# Patient Record
Sex: Female | Born: 1945 | Race: White | Hispanic: No | Smoking: Never smoker
Health system: Southern US, Community
[De-identification: ages and names within clinical notes are randomized; demographics above are authoritative.]

## PROBLEM LIST (undated history)

## (undated) DIAGNOSIS — F419 Anxiety disorder, unspecified: Secondary | ICD-10-CM

## (undated) DIAGNOSIS — R0600 Dyspnea, unspecified: Secondary | ICD-10-CM

## (undated) DIAGNOSIS — R011 Cardiac murmur, unspecified: Secondary | ICD-10-CM

## (undated) DIAGNOSIS — K219 Gastro-esophageal reflux disease without esophagitis: Secondary | ICD-10-CM

## (undated) DIAGNOSIS — L659 Nonscarring hair loss, unspecified: Secondary | ICD-10-CM

## (undated) DIAGNOSIS — E785 Hyperlipidemia, unspecified: Secondary | ICD-10-CM

## (undated) DIAGNOSIS — R5383 Other fatigue: Secondary | ICD-10-CM

## (undated) DIAGNOSIS — R12 Heartburn: Secondary | ICD-10-CM

## (undated) DIAGNOSIS — E041 Nontoxic single thyroid nodule: Secondary | ICD-10-CM

## (undated) DIAGNOSIS — B019 Varicella without complication: Secondary | ICD-10-CM

## (undated) DIAGNOSIS — M1990 Unspecified osteoarthritis, unspecified site: Secondary | ICD-10-CM

## (undated) DIAGNOSIS — K279 Peptic ulcer, site unspecified, unspecified as acute or chronic, without hemorrhage or perforation: Secondary | ICD-10-CM

## (undated) DIAGNOSIS — I499 Cardiac arrhythmia, unspecified: Secondary | ICD-10-CM

## (undated) DIAGNOSIS — G43909 Migraine, unspecified, not intractable, without status migrainosus: Secondary | ICD-10-CM

## (undated) HISTORY — PX: APPENDECTOMY: SHX54

## (undated) HISTORY — PX: JOINT REPLACEMENT: SHX530

## (undated) HISTORY — DX: Nonscarring hair loss, unspecified: L65.9

## (undated) HISTORY — PX: TUBAL LIGATION: SHX77

## (undated) HISTORY — DX: Heartburn: R12

## (undated) HISTORY — DX: Peptic ulcer, site unspecified, unspecified as acute or chronic, without hemorrhage or perforation: K27.9

## (undated) HISTORY — DX: Hyperlipidemia, unspecified: E78.5

## (undated) HISTORY — PX: CHOLECYSTECTOMY: SHX55

## (undated) HISTORY — DX: Other fatigue: R53.83

## (undated) HISTORY — PX: DILATION AND CURETTAGE OF UTERUS: SHX78

## (undated) HISTORY — PX: EYE SURGERY: SHX253

## (undated) HISTORY — PX: BREAST SURGERY: SHX581

## (undated) HISTORY — DX: Varicella without complication: B01.9

## (undated) HISTORY — DX: Migraine, unspecified, not intractable, without status migrainosus: G43.909

## (undated) HISTORY — PX: BREAST EXCISIONAL BIOPSY: SUR124

## (undated) HISTORY — PX: TONSILLECTOMY AND ADENOIDECTOMY: SUR1326

---

## 2005-03-25 ENCOUNTER — Ambulatory Visit: Payer: Self-pay | Admitting: Unknown Physician Specialty

## 2005-03-28 ENCOUNTER — Ambulatory Visit: Payer: Self-pay | Admitting: Unknown Physician Specialty

## 2005-04-26 ENCOUNTER — Ambulatory Visit: Payer: Self-pay | Admitting: General Surgery

## 2008-11-22 ENCOUNTER — Ambulatory Visit: Payer: Self-pay | Admitting: Unknown Physician Specialty

## 2009-12-07 ENCOUNTER — Ambulatory Visit: Payer: Self-pay | Admitting: Unknown Physician Specialty

## 2010-12-17 ENCOUNTER — Ambulatory Visit: Payer: Self-pay | Admitting: Unknown Physician Specialty

## 2011-05-09 ENCOUNTER — Emergency Department: Payer: Self-pay | Admitting: Internal Medicine

## 2011-05-13 ENCOUNTER — Ambulatory Visit: Payer: Self-pay | Admitting: Gastroenterology

## 2011-05-14 LAB — PATHOLOGY REPORT

## 2012-10-29 ENCOUNTER — Ambulatory Visit: Payer: Self-pay | Admitting: Obstetrics and Gynecology

## 2012-10-29 DIAGNOSIS — I10 Essential (primary) hypertension: Secondary | ICD-10-CM

## 2012-10-29 LAB — BASIC METABOLIC PANEL
Anion Gap: 4 — ABNORMAL LOW (ref 7–16)
BUN: 17 mg/dL (ref 7–18)
Calcium, Total: 8.8 mg/dL (ref 8.5–10.1)
EGFR (Non-African Amer.): >60
Glucose: 100 mg/dL — ABNORMAL HIGH (ref 65–99)
Osmolality: 281 (ref 275–301)
Sodium: 140 mmol/L (ref 136–145)

## 2012-10-29 LAB — CBC
HCT: 40 % (ref 35.0–47.0)
HGB: 13.4 g/dL (ref 12.0–16.0)
MCH: 29.7 pg (ref 26.0–34.0)
MCV: 89 fL (ref 80–100)
Platelet: 228 10*3/uL (ref 150–440)
RBC: 4.5 10*6/uL (ref 3.80–5.20)
RDW: 13.7 % (ref 11.5–14.5)

## 2013-03-23 ENCOUNTER — Encounter (HOSPITAL_COMMUNITY): Payer: Self-pay | Admitting: Pharmacy Technician

## 2013-03-24 ENCOUNTER — Other Ambulatory Visit (HOSPITAL_COMMUNITY): Payer: Medicare Other

## 2013-03-25 NOTE — Patient Instructions (Signed)
Kimberly Rojas  03/25/2013   Your procedure is scheduled on:  03/31/13              Surgery 0730am-0900am  Report to [facility] at    0530  AM.  Call this number if you have problems the morning of surgery: [phone]   Remember:   Do not eat food or drink liquids after midnight.   Take these medicines the morning of surgery with A SIP OF WATER:    Do not wear jewelry, make-up or nail polish.  Do not wear lotions, powders, or perfumes.   Do not shave 48 hours prior to surgery.  Do not bring valuables to the hospital.  Contacts, dentures or bridgework may not be worn into surgery.  Leave suitcase in the car. After surgery it may be brought to your room.  For patients admitted to the hospital, checkout time is 11:00 AM the day of  discharge.      SEE CHG INSTRUCTION SHEET    Please read over the following fact sheets that you were given:  coughing and deep breathing exercises, leg exercises               Failure to comply with these instructions may result in cancellation of your surgery.                Patient Signature ____________________________              Nurse Signature _____________________________

## 2013-03-26 ENCOUNTER — Ambulatory Visit (HOSPITAL_COMMUNITY)
Admission: RE | Admit: 2013-03-26 | Discharge: 2013-03-26 | Disposition: A | Discharge status: Home / Self Care | Payer: Medicare Other | Source: Ambulatory Visit | Attending: Surgical | Admitting: Surgical

## 2013-03-26 ENCOUNTER — Encounter (HOSPITAL_COMMUNITY): Payer: Self-pay

## 2013-03-26 ENCOUNTER — Encounter (HOSPITAL_COMMUNITY)
Admission: RE | Admit: 2013-03-26 | Discharge: 2013-03-26 | Disposition: A | Discharge status: Home / Self Care | Payer: Medicare Other | Source: Ambulatory Visit | Attending: Orthopedic Surgery | Admitting: Orthopedic Surgery

## 2013-03-26 DIAGNOSIS — Z01818 Encounter for other preprocedural examination: Secondary | ICD-10-CM | POA: Insufficient documentation

## 2013-03-26 DIAGNOSIS — S43429A Sprain of unspecified rotator cuff capsule, initial encounter: Secondary | ICD-10-CM | POA: Insufficient documentation

## 2013-03-26 DIAGNOSIS — X58XXXA Exposure to other specified factors, initial encounter: Secondary | ICD-10-CM | POA: Insufficient documentation

## 2013-03-26 DIAGNOSIS — Z01812 Encounter for preprocedural laboratory examination: Secondary | ICD-10-CM | POA: Insufficient documentation

## 2013-03-26 DIAGNOSIS — R9431 Abnormal electrocardiogram [ECG] [EKG]: Secondary | ICD-10-CM | POA: Insufficient documentation

## 2013-03-26 DIAGNOSIS — Z01810 Encounter for preprocedural cardiovascular examination: Secondary | ICD-10-CM | POA: Insufficient documentation

## 2013-03-26 HISTORY — DX: Unspecified osteoarthritis, unspecified site: M19.90

## 2013-03-26 HISTORY — DX: Cardiac murmur, unspecified: R01.1

## 2013-03-26 LAB — PROTIME-INR
INR: 1.03 (ref 0.00–1.49)
Prothrombin Time: 13.3 seconds (ref 11.6–15.2)

## 2013-03-26 LAB — COMPREHENSIVE METABOLIC PANEL
ALT: 12 U/L (ref 0–35)
AST: 17 U/L (ref 0–37)
Albumin: 3.4 g/dL — ABNORMAL LOW (ref 3.5–5.2)
Alkaline Phosphatase: 91 U/L (ref 39–117)
BUN: 14 mg/dL (ref 6–23)
CO2: 29 mEq/L (ref 19–32)
Calcium: 8.9 mg/dL (ref 8.4–10.5)
Chloride: 102 mEq/L (ref 96–112)
Creatinine, Ser: 0.82 mg/dL (ref 0.50–1.10)
GFR calc Af Amer: 83 mL/min — ABNORMAL LOW (ref >90)
GFR calc non Af Amer: 71 mL/min — ABNORMAL LOW (ref >90)
Glucose, Bld: 123 mg/dL — ABNORMAL HIGH (ref 70–99)
Potassium: 3.4 mEq/L — ABNORMAL LOW (ref 3.5–5.1)
Sodium: 139 mEq/L (ref 135–145)
Total Bilirubin: 0.3 mg/dL (ref 0.3–1.2)
Total Protein: 6.9 g/dL (ref 6.0–8.3)

## 2013-03-26 LAB — APTT: aPTT: 32 seconds (ref 24–37)

## 2013-03-26 LAB — URINALYSIS, ROUTINE W REFLEX MICROSCOPIC
Bilirubin Urine: NEGATIVE
Glucose, UA: NEGATIVE mg/dL
Ketones, ur: NEGATIVE mg/dL
Leukocytes, UA: NEGATIVE
Nitrite: NEGATIVE
Protein, ur: NEGATIVE mg/dL
Specific Gravity, Urine: 1.016 (ref 1.005–1.030)
Urobilinogen, UA: 0.2 mg/dL (ref 0.0–1.0)
pH: 5 (ref 5.0–8.0)

## 2013-03-26 LAB — CBC
HCT: 37.9 % (ref 36.0–46.0)
Platelets: 257 10*3/uL (ref 150–400)
RBC: 4.27 MIL/uL (ref 3.87–5.11)
RDW: 13.2 % (ref 11.5–15.5)
WBC: 7.2 10*3/uL (ref 4.0–10.5)

## 2013-03-26 LAB — URINE MICROSCOPIC-ADD ON

## 2013-03-26 NOTE — Progress Notes (Signed)
Urinalysis with micro results faxed via EPIC to Dr Gioffre.  

## 2013-03-26 NOTE — Progress Notes (Signed)
Last office visit with PCP 10/2012 on chart

## 2013-03-26 NOTE — Progress Notes (Signed)
Patient states: "  it takes very little pain medication for her. "

## 2013-03-28 NOTE — H&P (Signed)
Kimberly Rojas is an 67 y.o. female.   Chief Complaint: right shoulder pain HPI: [name] presented with the chief complaint of right shoulder pain. She reports she has been followed for this for the past 6 months. She was seen in December, at which time she had a shoulder injection with cortisone. It gave her relief until March. At that time she had just the right shoulder injected with cortisone. She says that lasted through about April, but for the couple months she's had increased pain mainly in the right shoulder. No new injury; however, she does report that, after some recollection she did fall back in September/October onto the right arm. She didn't feel a pop or a snap at that time but has developed these symptoms thereafter. She once again denies numbness and tingling in the arms. She does have some decreased ROM due to pain and does feel that the right arm mainly isn't strong enough to do what she needs it to do. She has pain if she lies on that side. She has pain with any activity with that right shoulder primarily. She says that the pain does occasionally radiate down through the right upper arm. It does not radiate down the entire arm, and she denies neck pain.MRI revealed a rotator cuff tear in the right shoulder.  Past Medical History  Diagnosis Date  . Heart murmur     hx of   . Arthritis     Past Surgical History  Procedure Laterality Date  . Cholecystectomy    . Breast surgery      left breast biopsy     Social History:  reports that she has never smoked. She has never used smokeless tobacco. She reports that  drinks alcohol. She reports that she does not use illicit drugs.  Allergies: No Known Allergies   Current outpatient prescriptions: acetaminophen (TYLENOL) 500 MG tablet, Take 500 mg by mouth every 6 (six) hours as needed for pain., Disp: , Rfl: ;   sodium chloride (OCEAN) 0.65 % nasal spray, Place 1 spray into the nose daily as needed for congestion.,  Disp: , Rfl:   Review of Systems  Constitutional: Negative.   HENT: Positive for hearing loss and tinnitus. Negative for ear pain, nosebleeds, congestion, sore throat, neck pain and ear discharge.   Eyes: Negative.   Respiratory: Negative.  Negative for stridor.   Cardiovascular: Positive for palpitations. Negative for chest pain, orthopnea, claudication, leg swelling and PND.  Gastrointestinal: Positive for heartburn. Negative for nausea, vomiting, abdominal pain, diarrhea, constipation and blood in stool.  Genitourinary: Negative.   Musculoskeletal: Positive for joint pain. Negative for myalgias, back pain and falls.       Right shoulder pain  Skin: Negative.   Neurological: Negative.  Negative for headaches.  Endo/Heme/Allergies: Negative.   Psychiatric/Behavioral: Negative.    Physical Exam  Constitutional: She is oriented to person, place, and time. She appears well-developed and well-nourished. No distress.  HENT:  Head: Normocephalic and atraumatic.  Right Ear: External ear normal.  Left Ear: External ear normal.  Nose: Nose normal.  Mouth/Throat: Oropharynx is clear and moist.  Eyes: Conjunctivae and EOM are normal.  Neck: Normal range of motion. Neck supple. No tracheal deviation present.  Cardiovascular: Normal rate, regular rhythm, normal heart sounds and intact distal pulses.   No murmur heard. Respiratory: Effort normal and breath sounds normal. No respiratory distress. She has no wheezes. She exhibits no tenderness.  GI: Soft. Bowel sounds are normal. She exhibits no  distension and no mass. There is no tenderness.  Musculoskeletal:       Right shoulder: She exhibits decreased range of motion, tenderness, pain and decreased strength.       Left shoulder: Normal.       Right elbow: Normal.      Left elbow: Normal.       Right wrist: Normal.       Left wrist: Normal.  Lymphadenopathy:    She has no cervical adenopathy.  Neurological: She is alert and oriented to  person, place, and time. She has normal reflexes. No sensory deficit.  Skin: No rash noted. No erythema.  Psychiatric: She has a normal mood and affect. Her behavior is normal.     Assessment/Plan Right shoulder, rotator cuff tear She needs an open right shoulder rotator cuff repair with graft and anchors. We may or may not need to use a graft material that is made from calf skin. This is approved by the FDA and Dr. Darrelyn Hillock have not had a rejection of that graft yet. Also, we may need to use anchors. These are polyethylene anchors that stay in the bone and we use those anchors to suture the tendon down in certain cases where the tendon is completely pulled off the bone. There is always a chance of a secondary infection obviously with any surgery but we do use antibiotics preop.   [address] PA-C   [place], [place] Leotis Shames 03/28/2013, 2:16 PM

## 2013-03-31 ENCOUNTER — Ambulatory Visit (HOSPITAL_COMMUNITY): Payer: Medicare Other | Admitting: Registered Nurse

## 2013-03-31 ENCOUNTER — Encounter (HOSPITAL_COMMUNITY): Payer: Self-pay | Admitting: Registered Nurse

## 2013-03-31 ENCOUNTER — Observation Stay (HOSPITAL_COMMUNITY)
Admission: RE | Admit: 2013-03-31 | Discharge: 2013-04-01 | Disposition: A | Discharge status: Home / Self Care | Payer: Medicare Other | Source: Ambulatory Visit | Attending: Orthopedic Surgery | Admitting: Orthopedic Surgery

## 2013-03-31 ENCOUNTER — Encounter (HOSPITAL_COMMUNITY)
Admission: RE | Disposition: A | Discharge status: Home / Self Care | Payer: Self-pay | Source: Ambulatory Visit | Attending: Orthopedic Surgery

## 2013-03-31 ENCOUNTER — Encounter (HOSPITAL_COMMUNITY): Payer: Self-pay | Admitting: *Deleted

## 2013-03-31 DIAGNOSIS — M75120 Complete rotator cuff tear or rupture of unspecified shoulder, not specified as traumatic: Secondary | ICD-10-CM | POA: Diagnosis present

## 2013-03-31 DIAGNOSIS — S43429A Sprain of unspecified rotator cuff capsule, initial encounter: Principal | ICD-10-CM | POA: Insufficient documentation

## 2013-03-31 DIAGNOSIS — M75121 Complete rotator cuff tear or rupture of right shoulder, not specified as traumatic: Secondary | ICD-10-CM

## 2013-03-31 DIAGNOSIS — X58XXXA Exposure to other specified factors, initial encounter: Secondary | ICD-10-CM | POA: Insufficient documentation

## 2013-03-31 HISTORY — PX: SHOULDER OPEN ROTATOR CUFF REPAIR: SHX2407

## 2013-03-31 SURGERY — REPAIR, ROTATOR CUFF, OPEN
Anesthesia: General | Site: Shoulder | Laterality: Right | Wound class: Clean

## 2013-03-31 MED ORDER — HYDROMORPHONE HCL PF 1 MG/ML IJ SOLN
INTRAMUSCULAR | Status: AC
  Filled 2013-03-31: qty 1

## 2013-03-31 MED ORDER — BUPIVACAINE LIPOSOME 1.3 % IJ SUSP
20.0000 mL | Freq: Once | INTRAMUSCULAR | Status: DC
  Filled 2013-03-31: qty 20

## 2013-03-31 MED ORDER — FENTANYL CITRATE 0.05 MG/ML IJ SOLN
INTRAMUSCULAR | Status: DC | PRN
  Administered 2013-03-31 (×3): 50 ug via INTRAVENOUS

## 2013-03-31 MED ORDER — HYDROMORPHONE HCL PF 1 MG/ML IJ SOLN
0.5000 mg | INTRAMUSCULAR | Status: DC | PRN
  Administered 2013-03-31: 1 mg via INTRAVENOUS
  Filled 2013-03-31: qty 1

## 2013-03-31 MED ORDER — HYDROMORPHONE HCL PF 1 MG/ML IJ SOLN
0.2500 mg | INTRAMUSCULAR | Status: DC | PRN
  Administered 2013-03-31 (×4): 0.5 mg via INTRAVENOUS

## 2013-03-31 MED ORDER — METHOCARBAMOL 500 MG PO TABS
500.0000 mg | ORAL_TABLET | Freq: Four times a day (QID) | ORAL | Status: DC | PRN
  Administered 2013-03-31 – 2013-04-01 (×2): 500 mg via ORAL
  Filled 2013-03-31 (×2): qty 1

## 2013-03-31 MED ORDER — SODIUM CHLORIDE 0.9 % IJ SOLN
INTRAMUSCULAR | Status: DC | PRN
  Administered 2013-03-31: 09:00:00

## 2013-03-31 MED ORDER — ACETAMINOPHEN 650 MG RE SUPP: 650.0000 mg | Freq: Four times a day (QID) | RECTAL | Status: DC | PRN

## 2013-03-31 MED ORDER — CEFAZOLIN SODIUM-DEXTROSE 2-3 GM-% IV SOLR
2.0000 g | INTRAVENOUS | Status: AC
  Administered 2013-03-31: 2 g via INTRAVENOUS

## 2013-03-31 MED ORDER — SODIUM CHLORIDE 0.9 % IR SOLN
Status: DC | PRN
  Administered 2013-03-31: 08:00:00

## 2013-03-31 MED ORDER — BISACODYL 10 MG RE SUPP: 10.0000 mg | Freq: Every day | RECTAL | Status: DC | PRN

## 2013-03-31 MED ORDER — HYDROCODONE-ACETAMINOPHEN 5-325 MG PO TABS
1.0000 | ORAL_TABLET | ORAL | Status: DC | PRN
  Administered 2013-03-31 (×2): 1 via ORAL
  Administered 2013-04-01: 2 via ORAL
  Administered 2013-04-01 (×2): 1 via ORAL
  Filled 2013-03-31: qty 2
  Filled 2013-03-31 (×4): qty 1

## 2013-03-31 MED ORDER — ROCURONIUM BROMIDE 100 MG/10ML IV SOLN
INTRAVENOUS | Status: DC | PRN
  Administered 2013-03-31: 35 mg via INTRAVENOUS

## 2013-03-31 MED ORDER — ONDANSETRON HCL 4 MG PO TABS: 4.0000 mg | ORAL_TABLET | Freq: Four times a day (QID) | ORAL | Status: DC | PRN

## 2013-03-31 MED ORDER — EPHEDRINE SULFATE 50 MG/ML IJ SOLN
INTRAMUSCULAR | Status: DC | PRN
  Administered 2013-03-31: 5 mg via INTRAVENOUS

## 2013-03-31 MED ORDER — MIDAZOLAM HCL 5 MG/5ML IJ SOLN
INTRAMUSCULAR | Status: DC | PRN
  Administered 2013-03-31 (×2): 1 mg via INTRAVENOUS

## 2013-03-31 MED ORDER — LIDOCAINE HCL (CARDIAC) 20 MG/ML IV SOLN
INTRAVENOUS | Status: DC | PRN
  Administered 2013-03-31: 70 mg via INTRAVENOUS

## 2013-03-31 MED ORDER — MENTHOL 3 MG MT LOZG: 1.0000 | LOZENGE | OROMUCOSAL | Status: DC | PRN

## 2013-03-31 MED ORDER — LACTATED RINGERS IV SOLN
INTRAVENOUS | Status: DC
  Administered 2013-03-31 – 2013-04-01 (×2): via INTRAVENOUS

## 2013-03-31 MED ORDER — PROPOFOL 10 MG/ML IV BOLUS
INTRAVENOUS | Status: DC | PRN
  Administered 2013-03-31: 150 mg via INTRAVENOUS

## 2013-03-31 MED ORDER — PROMETHAZINE HCL 25 MG/ML IJ SOLN: 6.2500 mg | INTRAMUSCULAR | Status: DC | PRN

## 2013-03-31 MED ORDER — POLYETHYLENE GLYCOL 3350 17 G PO PACK: 17.0000 g | PACK | Freq: Every day | ORAL | Status: DC | PRN

## 2013-03-31 MED ORDER — GLYCOPYRROLATE 0.2 MG/ML IJ SOLN
INTRAMUSCULAR | Status: DC | PRN
  Administered 2013-03-31: .6 mg via INTRAVENOUS

## 2013-03-31 MED ORDER — PHENOL 1.4 % MT LIQD: 1.0000 | OROMUCOSAL | Status: DC | PRN

## 2013-03-31 MED ORDER — PHENYLEPHRINE HCL 10 MG/ML IJ SOLN
INTRAMUSCULAR | Status: DC | PRN
  Administered 2013-03-31 (×3): 40 ug via INTRAVENOUS

## 2013-03-31 MED ORDER — METHOCARBAMOL 100 MG/ML IJ SOLN
500.0000 mg | Freq: Four times a day (QID) | INTRAVENOUS | Status: DC | PRN
  Administered 2013-03-31: 500 mg via INTRAVENOUS
  Filled 2013-03-31: qty 5

## 2013-03-31 MED ORDER — ACETAMINOPHEN 325 MG PO TABS: 650.0000 mg | ORAL_TABLET | Freq: Four times a day (QID) | ORAL | Status: DC | PRN

## 2013-03-31 MED ORDER — THROMBIN 5000 UNITS EX SOLR
CUTANEOUS | Status: AC
  Filled 2013-03-31: qty 5000

## 2013-03-31 MED ORDER — ONDANSETRON HCL 4 MG/2ML IJ SOLN
4.0000 mg | Freq: Four times a day (QID) | INTRAMUSCULAR | Status: DC | PRN
  Administered 2013-03-31: 4 mg via INTRAVENOUS
  Filled 2013-03-31: qty 2

## 2013-03-31 MED ORDER — CEFAZOLIN SODIUM 1-5 GM-% IV SOLN
1.0000 g | Freq: Four times a day (QID) | INTRAVENOUS | Status: AC
  Administered 2013-03-31 – 2013-04-01 (×3): 1 g via INTRAVENOUS
  Filled 2013-03-31 (×3): qty 50

## 2013-03-31 MED ORDER — OXYCODONE-ACETAMINOPHEN 5-325 MG PO TABS
1.0000 | ORAL_TABLET | ORAL | Status: DC | PRN
  Administered 2013-03-31: 1 via ORAL
  Filled 2013-03-31: qty 1

## 2013-03-31 MED ORDER — THROMBIN 5000 UNITS EX SOLR
OROMUCOSAL | Status: DC | PRN
  Administered 2013-03-31: 08:00:00 via TOPICAL

## 2013-03-31 MED ORDER — ONDANSETRON HCL 4 MG/2ML IJ SOLN
INTRAMUSCULAR | Status: DC | PRN
  Administered 2013-03-31: 4 mg via INTRAVENOUS

## 2013-03-31 MED ORDER — LACTATED RINGERS IV SOLN
INTRAVENOUS | Status: DC
  Administered 2013-03-31 (×2): via INTRAVENOUS

## 2013-03-31 MED ORDER — CEFAZOLIN SODIUM-DEXTROSE 2-3 GM-% IV SOLR
INTRAVENOUS | Status: AC
  Filled 2013-03-31: qty 50

## 2013-03-31 MED ORDER — NEOSTIGMINE METHYLSULFATE 1 MG/ML IJ SOLN
INTRAMUSCULAR | Status: DC | PRN
  Administered 2013-03-31: 4 mg via INTRAVENOUS

## 2013-03-31 SURGICAL SUPPLY — 42 items
ANCHOR PEEK ZIP 5.5 NDL NO2 (Orthopedic Implant) ×6 IMPLANT
BAG ZIPLOCK 12X15 (MISCELLANEOUS) ×2 IMPLANT
BLADE OSCILLATING/SAGITTAL (BLADE) ×1
BLADE SW THK.38XMED LNG THN (BLADE) ×1 IMPLANT
BNDG COHESIVE 6X5 TAN NS LF (GAUZE/BANDAGES/DRESSINGS) ×2 IMPLANT
BUR OVAL CARBIDE 4.0 (BURR) ×2 IMPLANT
CLEANER TIP ELECTROSURG 2X2 (MISCELLANEOUS) ×2 IMPLANT
CLOTH BEACON ORANGE TIMEOUT ST (SAFETY) ×2 IMPLANT
DERMABOND ADVANCED (GAUZE/BANDAGES/DRESSINGS) ×1
DERMABOND ADVANCED .7 DNX12 (GAUZE/BANDAGES/DRESSINGS) ×1 IMPLANT
DRAPE POUCH INSTRU U-SHP 10X18 (DRAPES) ×2 IMPLANT
DRSG AQUACEL AG ADV 3.5X 6 (GAUZE/BANDAGES/DRESSINGS) ×2 IMPLANT
DURAPREP 26ML APPLICATOR (WOUND CARE) ×2 IMPLANT
ELECT REM PT RETURN 9FT ADLT (ELECTROSURGICAL) ×2
ELECTRODE REM PT RTRN 9FT ADLT (ELECTROSURGICAL) ×1 IMPLANT
GLOVE BIOGEL PI IND STRL 8 (GLOVE) ×1 IMPLANT
GLOVE BIOGEL PI INDICATOR 8 (GLOVE) ×1
GLOVE ECLIPSE 8.0 STRL XLNG CF (GLOVE) ×4 IMPLANT
GLOVE SURG SS PI 6.5 STRL IVOR (GLOVE) ×4 IMPLANT
GOWN PREVENTION PLUS LG XLONG (DISPOSABLE) ×2 IMPLANT
GOWN PREVENTION PLUS XXLARGE (GOWN DISPOSABLE) ×4 IMPLANT
KIT BASIN OR (CUSTOM PROCEDURE TRAY) ×2 IMPLANT
MANIFOLD NEPTUNE II (INSTRUMENTS) ×2 IMPLANT
NS IRRIG 1000ML POUR BTL (IV SOLUTION) ×2 IMPLANT
PACK SHOULDER CUSTOM OPM052 (CUSTOM PROCEDURE TRAY) ×2 IMPLANT
PASSER SUT SWANSON 36MM LOOP (INSTRUMENTS) IMPLANT
PATCH TISSUE MEND 3X3CM (Orthopedic Implant) ×2 IMPLANT
POSITIONER SURGICAL ARM (MISCELLANEOUS) ×2 IMPLANT
SLING ARM IMMOBILIZER MED (SOFTGOODS) ×2 IMPLANT
SPONGE SURGIFOAM ABS GEL 100 (HEMOSTASIS) ×2 IMPLANT
STAPLER VISISTAT 35W (STAPLE) IMPLANT
SUCTION FRAZIER 12FR DISP (SUCTIONS) ×2 IMPLANT
SUT BONE WAX W31G (SUTURE) ×2 IMPLANT
SUT ETHIBOND NAB CT1 #1 30IN (SUTURE) ×4 IMPLANT
SUT MNCRL AB 4-0 PS2 18 (SUTURE) ×2 IMPLANT
SUT VIC AB 0 CT1 27 (SUTURE) ×1
SUT VIC AB 0 CT1 27XBRD ANTBC (SUTURE) ×1 IMPLANT
SUT VIC AB 1 CT1 27 (SUTURE) ×2
SUT VIC AB 1 CT1 27XBRD ANTBC (SUTURE) ×2 IMPLANT
SUT VIC AB 2-0 CT1 27 (SUTURE) ×1
SUT VIC AB 2-0 CT1 27XBRD (SUTURE) ×1 IMPLANT
TOWEL OR 17X26 10 PK STRL BLUE (TOWEL DISPOSABLE) ×2 IMPLANT

## 2013-03-31 NOTE — Preoperative (Signed)
Beta Blockers   Reason not to administer Beta Blockers:Not Applicable 

## 2013-03-31 NOTE — Plan of Care (Signed)
Problem: Diagnosis - Type of Surgery Goal: General Surgical Patient Education (See Patient Education module for education specifics) Right rotator cuff     

## 2013-03-31 NOTE — Interval H&P Note (Signed)
History and Physical Interval Note:  03/31/2013 7:15 AM  Kimberly Rojas  has presented today for surgery, with the diagnosis of right shoulder rotator cuff tear  The various methods of treatment have been discussed with the patient and family. After consideration of risks, benefits and other options for treatment, the patient has consented to  Procedure(s): RIGHT ROTATOR CUFF REPAIR SHOULDER OPEN (Right) as a surgical intervention .  The patient's history has been reviewed, patient examined, no change in status, stable for surgery.  I have reviewed the patient's chart and labs.  Questions were answered to the patient's satisfaction.     Lyberti Thrush A

## 2013-03-31 NOTE — Transfer of Care (Signed)
Immediate Anesthesia Transfer of Care Note  Patient: Kimberly Rojas  Procedure(s) Performed: Procedure(s): RIGHT ROTATOR CUFF REPAIR SHOULDER OPEN (Right)  Patient Location: PACU  Anesthesia Type:General  Level of Consciousness: awake, alert , oriented and patient cooperative  Airway & Oxygen Therapy: Patient Spontanous Breathing and Patient connected to face mask oxygen  Post-op Assessment: Report given to PACU RN, Post -op Vital signs reviewed and stable and Patient moving all extremities  Post vital signs: Reviewed and stable  Complications: No apparent anesthesia complications

## 2013-03-31 NOTE — H&P (View-Only) (Signed)
Urinalysis with micro results faxed via EPIC to Dr Gioffre.  

## 2013-03-31 NOTE — Brief Op Note (Signed)
03/31/2013  8:36 AM  PATIENT:  Kimberly Rojas  67 y.o. female  PRE-OPERATIVE DIAGNOSIS:  right shoulder rotator cuff tear  POST-OPERATIVE DIAGNOSIS:  right shoulder rotator cuff tear,Complex,Retracted Tear  PROCEDURE:  Procedure(s): RIGHT ROTATOR CUFF REPAIR SHOULDER OPEN (Right),Complex Repair withTissue Mend Graft and 3-anchors.  SURGEON:  Surgeon(s) and Role:    * Jacki Cones, MD - Primary  PHYSICIAN ASSISTANT:Amber Shorter PA   ASSISTANTS: Dimitri Ped PA  ANESTHESIA:   general  EBL:  Total I/O In: 1000 [I.V.:1000] Out: -   BLOOD ADMINISTERED:none  DRAINS: none   LOCAL MEDICATIONS USED:  BUPIVICAINE 20cc   SPECIMEN:  No Specimen  DISPOSITION OF SPECIMEN:  N/A  COUNTS:  YES  TOURNIQUET:  * No tourniquets in log *  DICTATION: .Other Dictation: Dictation Number [id number]  PLAN OF CARE: Admit for overnight observation  PATIENT DISPOSITION:  Stable in OR   Delay start of Pharmacological VTE agent (>24hrs) due to surgical blood loss or risk of bleeding: yes

## 2013-03-31 NOTE — Anesthesia Postprocedure Evaluation (Signed)
  Anesthesia Post-op Note  Patient: Kimberly Rojas  Procedure(s) Performed: Procedure(s) (LRB): RIGHT ROTATOR CUFF REPAIR SHOULDER OPEN (Right)  Patient Location: PACU  Anesthesia Type: General  Level of Consciousness: awake and alert   Airway and Oxygen Therapy: Patient Spontanous Breathing  Post-op Pain: mild  Post-op Assessment: Post-op Vital signs reviewed, Patient's Cardiovascular Status Stable, Respiratory Function Stable, Patent Airway and No signs of Nausea or vomiting  Last Vitals:  Filed Vitals:   03/31/13 1126  BP: 146/76  Pulse: 75  Temp: 36.4 C  Resp: 16    Post-op Vital Signs: stable   Complications: No apparent anesthesia complications

## 2013-03-31 NOTE — Anesthesia Preprocedure Evaluation (Addendum)
Anesthesia Evaluation  Patient identified by MRN, date of birth, ID band Patient awake    Reviewed: Allergy & Precautions, H&P , NPO status , Patient's Chart, lab work & pertinent test results  Airway Mallampati: II TM Distance: >3 FB Neck ROM: Full    Dental no notable dental hx.    Pulmonary neg pulmonary ROS,  breath sounds clear to auscultation  Pulmonary exam normal       Cardiovascular Exercise Tolerance: Good negative cardio ROS  Rhythm:Regular Rate:Normal     Neuro/Psych negative neurological ROS  negative psych ROS   GI/Hepatic negative GI ROS, Neg liver ROS,   Endo/Other  negative endocrine ROS  Renal/GU negative Renal ROS  negative genitourinary   Musculoskeletal negative musculoskeletal ROS (+)   Abdominal   Peds negative pediatric ROS (+)  Hematology negative hematology ROS (+)   Anesthesia Other Findings   Reproductive/Obstetrics negative OB ROS                           Anesthesia Physical Anesthesia Plan  ASA: II  Anesthesia Plan: General   Post-op Pain Management:    Induction: Intravenous  Airway Management Planned: Oral ETT  Additional Equipment:   Intra-op Plan:   Post-operative Plan: Extubation in OR  Informed Consent: I have reviewed the patients History and Physical, chart, labs and discussed the procedure including the risks, benefits and alternatives for the proposed anesthesia with the patient or authorized representative who has indicated his/her understanding and acceptance.   Dental advisory given  Plan Discussed with: CRNA  Anesthesia Plan Comments:         Anesthesia Quick Evaluation  

## 2013-04-01 ENCOUNTER — Encounter (HOSPITAL_COMMUNITY): Payer: Self-pay | Admitting: Orthopedic Surgery

## 2013-04-01 MED ORDER — ONDANSETRON HCL 4 MG PO TABS: 4.0000 mg | ORAL_TABLET | Freq: Three times a day (TID) | ORAL | Status: DC | PRN

## 2013-04-01 MED ORDER — OXYCODONE-ACETAMINOPHEN 10-325 MG PO TABS: 1.0000 | ORAL_TABLET | ORAL | Status: DC | PRN

## 2013-04-01 MED ORDER — METHOCARBAMOL 500 MG PO TABS: 500.0000 mg | ORAL_TABLET | Freq: Four times a day (QID) | ORAL | Status: DC

## 2013-04-01 NOTE — Care Management Note (Signed)
    Page 1 of 1   04/01/2013     12:14:38 PM   CARE MANAGEMENT NOTE 04/01/2013  Patient:  Kimberly Rojas, Kimberly Rojas   Account Number:  [id number]  Date Initiated:  04/01/2013  Documentation initiated by:  Colleen Can  Subjective/Objective Assessment:   dx rorator cuff tera repair-rt     Action/Plan:   Home upon discharge. No HH or DME needs   Anticipated DC Date:  04/01/2013   Anticipated DC Plan:  HOME/SELF CARE      DC Planning Services  CM consult      Choice offered to / List presented to:             Status of service:  Completed, signed off Medicare Important Message given?   (If response is "NO", the following Medicare IM given date fields will be blank) Date Medicare IM given:   Date Additional Medicare IM given:    Discharge Disposition:  HOME/SELF CARE  Per UR Regulation:  Reviewed for med. necessity/level of care/duration of stay  If discussed at Long Length of Stay Meetings, dates discussed:    Comments:

## 2013-04-01 NOTE — Progress Notes (Signed)
Pt stable, scripts, d/c instructions given with no questions/concerns voiced by pt or husband.  Pt transported via wheelchair to private vehicle by NT and husband. 

## 2013-04-01 NOTE — Evaluation (Signed)
Occupational Therapy Evaluation Patient Details Name: Kimberly Rojas MRN: [id number] DOB: 02/28/1946 Today's Date: 04/01/2013 Time: 5621-3086 and 8:20 - 8:25 OT Time Calculation (min): 31 min  OT Assessment / Plan / Recommendation History of present illness     Clinical Impression   Pt was admitted for R RCR of complete tear.  All education was completed.  Pt will follow up with Dr Darrelyn Hillock for further rehab.      OT Assessment  Progress rehab of shoulder as ordered by MD at follow-up appointment    Follow Up Recommendations   (follow up with dr Darrelyn Hillock)    Barriers to Discharge      Equipment Recommendations  None recommended by OT    Recommendations for Other Services    Frequency       Precautions / Restrictions Precautions Precautions: Shoulder Type of Shoulder Precautions: sling at all times x bathing/dressing Precaution Booklet Issued: Yes (comment) Restrictions Weight Bearing Restrictions: No   Pertinent Vitals/Pain Pain 5/10 R shoulder.      ADL  Upper Body Bathing: Moderate assistance Where Assessed - Upper Body Bathing: Unsupported sitting Upper Body Dressing: Maximal assistance; pt wore zip up sports bra as well as zip up shirt Where Assessed - Upper Body Dressing: Unsupported sitting Lower Body Dressing: Moderate assistance Where Assessed - Lower Body Dressing: Supported sit to stand Toilet Transfer: Hydrographic surveyor Method: Sit to Barista: Comfort height toilet Toileting - Clothing Manipulation and Hygiene: Minimal assistance Where Assessed - Toileting Clothing Manipulation and Hygiene: Sit to stand from 3-in-1 or toilet Transfers/Ambulation Related to ADLs: pt moves quickly; cued to be careful as balance may be affected by slinged arm.  Husband will be with her all the time ADL Comments: Husband observed adl and donned sling with cues.  Verbalizes all education.  Please see education section of chart for topics covered.       OT Diagnosis: Generalized weakness  OT Problem List:   OT Treatment Interventions:     OT Goals(Current goals can be found in the care plan section)    Visit Information  Last OT Received On: 04/01/13 Assistance Needed: +1       Prior Functioning     Home Living Family/patient expects to be discharged to:: Private residence Living Arrangements: Spouse/significant other Prior Function Level of Independence: Independent Communication Communication: No difficulties Dominant Hand: Right         Vision/Perception     Cognition  Cognition Arousal/Alertness: Awake/alert Behavior During Therapy: WFL for tasks assessed/performed Overall Cognitive Status: Within Functional Limits for tasks assessed    Extremity/Trunk Assessment Upper Extremity Assessment Upper Extremity Assessment: RUE deficits/detail RUE Deficits / Details: RUE immoblized; LUE wfls     Mobility Transfers Transfers: Sit to Stand Sit to Stand: 5: Supervision     Exercise     Balance     End of Session OT - End of Session Activity Tolerance: Patient tolerated treatment well Patient left: in chair;with family/visitor present Nurse Communication:  (ready for d/c)  GO Functional Assessment Tool Used: clinical observation Functional Limitation: Self care Self Care Current Status (V7846): At least 40 percent but less than 60 percent impaired, limited or restricted Self Care Goal Status (N6295): At least 40 percent but less than 60 percent impaired, limited or restricted Self Care Discharge Status [phone]): At least 40 percent but less than 60 percent impaired, limited or restricted   [facility] 04/01/2013, 11:39 AM Marica Otter, OTR/L [phone] 04/01/2013

## 2013-04-01 NOTE — Progress Notes (Signed)
Subjective: 1 Day Post-Op Procedure(s) (LRB): RIGHT ROTATOR CUFF REPAIR SHOULDER OPEN (Right) Patient reports pain as 7 on 0-10 scale. Doing much better this morning. Will DC  Objective: Vital signs in last 24 hours: Temp:  [97.3 F (36.3 C)-98.5 F (36.9 C)] 98.4 F (36.9 C) (07/31 0544) Pulse Rate:  [64-85] 64 (07/31 0544) Resp:  [11-17] 14 (07/31 0544) BP: (113-151)/(61-81) 138/81 mmHg (07/31 0544) SpO2:  [96 %-100 %] 97 % (07/31 0544) Weight:  [79.833 kg (176 lb)] 79.833 kg (176 lb) (07/30 1015)  Intake/Output from previous day: 07/30 0701 - 07/31 0700 In: 3623.3 [P.O.:240; I.V.:3333.3; IV Piggyback:50] Out: 1020 [Urine:1000; Blood:20] Intake/Output this shift:    No results found for this basename: HGB,  in the last 72 hours No results found for this basename: WBC, RBC, HCT, PLT,  in the last 72 hours No results found for this basename: NA, K, CL, CO2, BUN, CREATININE, GLUCOSE, CALCIUM,  in the last 72 hours No results found for this basename: LABPT, INR,  in the last 72 hours  Neurovascular intact  Assessment/Plan: 1 Day Post-Op Procedure(s) (LRB): RIGHT ROTATOR CUFF REPAIR SHOULDER OPEN (Right) Discharge home with home health  Whitt Auletta A 04/01/2013, 7:14 AM

## 2013-04-01 NOTE — Discharge Summary (Signed)
Physician Discharge Summary  Patient ID: SHILEE BIGGS MRN: [id number] DOB/AGE: 69-26-1947 69 y.o.  Admit date: 03/31/2013 Discharge date: 04/01/2013  Admission Diagnoses:Complete Right Rotator Cuff Tear.  Discharge Diagnoses: Complete,Retracted ,Tear of Right Rotator cuff. Active Problems:   Complete tear of rotator cuff   Discharged Condition: Improved.  Hospital Course: No post-op Problems  Consults: Occupational Therapy  Significant Diagnostic Studies: None  Treatments:OT  Discharge Exam: Blood pressure 138/81, pulse 64, temperature 98.4 F (36.9 C), temperature source Oral, resp. rate 14, height 5\' 1"  (1.549 m), weight 79.833 kg (176 lb), SpO2 97.00%. Extremities: extremities normal, atraumatic, no cyanosis or edema  Disposition: Final discharge disposition not confirmed  Discharge Orders   Future Orders Complete By Expires     Call MD / Call 911  As directed     Comments:      If you experience chest pain or shortness of breath, CALL 911 and be transported to the hospital emergency room.  If you develope a fever above 101 F, pus (white drainage) or increased drainage or redness at the wound, or calf pain, call your surgeon's office.    Discharge instructions  As directed     Comments:      Keep your sling on at all times, including sleeping in your sling.  The only time you should remove your sling is to shower only but you need to keep your hand against your chest while you shower.  Call office to be seen in one and a haff weeks:[phone] For the first few days, remove your dressing, tape a piece of saran wrap over your incision, take your shower, then remove the saran wrap and put a clean dressing on, then reapply your sling.  After two days you can shower without the saran wrap.   Call Dr. Darrelyn Hillock if any wound complications or temperature of 101 degrees F or over.   Call the office for an appointment to see Dr. Darrelyn Hillock in two weeks: [phone] and ask for  Dr. Jeannetta Ellis nurse, Mackey [place].    Driving restrictions  As directed     Comments:      No driving for four weeks    Increase activity slowly as tolerated  As directed         Medication List    STOP taking these medications       acetaminophen 500 MG tablet  Commonly known as:  TYLENOL     sodium chloride 0.65 % nasal spray  Commonly known as:  OCEAN      TAKE these medications       methocarbamol 500 MG tablet  Commonly known as:  ROBAXIN  Take 1 tablet (500 mg total) by mouth 4 (four) times daily.     oxyCODONE-acetaminophen 10-325 MG per tablet  Commonly known as:  PERCOCET  Take 1 tablet by mouth every 4 (four) hours as needed for pain.         Signed: Brennen Camper A 04/01/2013, 7:20 AM

## 2013-04-01 NOTE — Op Note (Signed)
NAMEMAZELLA, DEEN              ACCOUNT NO.:  [id number]  MEDICAL RECORD NO.:  [id number]  LOCATION:  1607                         FACILITY:  [facility]  PHYSICIAN:  Georges Lynch. Darcy Barbara, M.D.DATE OF BIRTH:  1945/11/12  DATE OF PROCEDURE:  03/31/2013 DATE OF DISCHARGE:                              OPERATIVE REPORT   SURGEON:  Georges Lynch. Nesiah Jump, M.D.  ASSISTANT:  Dimitri Ped, [place]  PREOPERATIVE DIAGNOSIS:  Complete retracted complex tear of the rotator cuff tendon on the right.  POSTOPERATIVE DIAGNOSIS:  Complete retracted complex tear of the rotator cuff tendon on the right.  OPERATION: 1. Repair of the complete rotator cuff tendon tear, right shoulder     utilizing a TissueMend graft and 3 anchors. 2. Open acromionectomy and acromioplasty, right shoulder.  DESCRIPTION OF PROCEDURE:  Under general anesthesia, the patient in a beach chair position, the routine orthopedic prep and draping of the right shoulder was carried out.  Appropriate time-out was first carried out.  I also marked the appropriate right arm in the holding area.  The patient was given 2 g of IV Ancef.  At this time, an incision was made over the anterior aspect of the right shoulder.  Bleeders were identified and cauterized.  I separated deltoid tendon muscle in usual fashion from the acromion.  I then went down and noted a large tear of the rotator cuff tendon.  I brought the shoulder up into abduction and one can see where the acromion literally was imbedded down into the tendon that caused a large hole in the tendon.  The central part was retracted.  At this time, I protected the underlying tendon.  I did a bursectomy.  I then utilized the oscillating saw and a bur to do a partial acromionectomy and acromioplasty.  I then bone waxed the undersurface of the acromion.  I then burred the lateral articular surface of the humerus to get good bleeding bone.  I then inserted 3 anchors in the proximal humerus.  Two  of those anchor sutures were utilized to bring the cuff down into the normal position and sutured in place.  Third anchor was utilized to suture down the TissueMend graft. The TissueMend graft was applied in usual fashion.  I thoroughly irrigated out the shoulder and thrombin-soaked Gelfoam was inserted posteriorly.  The deltoid tendon and muscle was reapproximated in usual fashion.  Subcu was closed with running locking Monocryl suture.  The patient's sterile dressing was applied.  The patient was placed in a shoulder immobilizer.          ______________________________ Georges Lynch Darrelyn Hillock, M.D.     RAG/MEDQ  D:  03/31/2013  T:  04/01/2013  Job:  [id number]

## 2013-05-06 ENCOUNTER — Emergency Department: Payer: Self-pay | Admitting: Emergency Medicine

## 2013-09-23 ENCOUNTER — Ambulatory Visit: Payer: Self-pay | Admitting: Physical Medicine and Rehabilitation

## 2014-04-20 DIAGNOSIS — M5116 Intervertebral disc disorders with radiculopathy, lumbar region: Secondary | ICD-10-CM | POA: Insufficient documentation

## 2014-04-20 DIAGNOSIS — M5136 Other intervertebral disc degeneration, lumbar region: Secondary | ICD-10-CM | POA: Insufficient documentation

## 2014-05-27 ENCOUNTER — Ambulatory Visit (INDEPENDENT_AMBULATORY_CARE_PROVIDER_SITE_OTHER): Payer: Medicare Other

## 2014-05-27 ENCOUNTER — Encounter: Payer: Self-pay | Admitting: Podiatry

## 2014-05-27 ENCOUNTER — Ambulatory Visit (INDEPENDENT_AMBULATORY_CARE_PROVIDER_SITE_OTHER): Payer: Medicare Other | Admitting: Podiatry

## 2014-05-27 VITALS — Ht 61.0 in | Wt 146.0 lb

## 2014-05-27 DIAGNOSIS — S90122A Contusion of left lesser toe(s) without damage to nail, initial encounter: Secondary | ICD-10-CM

## 2014-05-27 DIAGNOSIS — S90129A Contusion of unspecified lesser toe(s) without damage to nail, initial encounter: Secondary | ICD-10-CM

## 2014-05-27 NOTE — Progress Notes (Signed)
Subjective:     Patient ID: Kimberly Rojas, female   DOB: [birth year], 68 y.o.   MRN: [id number]  HPI patient states the storm door hit my big toe left foot 10 days ago and it's been very tender and swollen   Review of Systems     Objective:   Physical Exam Neurovascular status unchanged with swollen left big toe secondary to trauma which occurred 10 days ago    Assessment:     Traumatized left hallux with possibility for fracture    Plan:     X-rays reviewed and today I advised on Epson salt therapy and open toed shoes

## 2014-11-23 DIAGNOSIS — E7800 Pure hypercholesterolemia, unspecified: Secondary | ICD-10-CM | POA: Insufficient documentation

## 2014-11-23 DIAGNOSIS — E039 Hypothyroidism, unspecified: Secondary | ICD-10-CM | POA: Insufficient documentation

## 2014-11-23 DIAGNOSIS — E038 Other specified hypothyroidism: Secondary | ICD-10-CM | POA: Insufficient documentation

## 2014-11-23 DIAGNOSIS — I10 Essential (primary) hypertension: Secondary | ICD-10-CM | POA: Insufficient documentation

## 2015-05-10 ENCOUNTER — Other Ambulatory Visit: Payer: Self-pay | Admitting: Internal Medicine

## 2015-05-10 DIAGNOSIS — Z1231 Encounter for screening mammogram for malignant neoplasm of breast: Secondary | ICD-10-CM

## 2015-05-24 ENCOUNTER — Other Ambulatory Visit: Payer: Self-pay | Admitting: Internal Medicine

## 2015-05-24 ENCOUNTER — Ambulatory Visit
Admission: RE | Admit: 2015-05-24 | Discharge: 2015-05-24 | Disposition: A | Discharge status: Home / Self Care | Payer: Medicare Other | Source: Ambulatory Visit | Attending: Internal Medicine | Admitting: Internal Medicine

## 2015-05-24 DIAGNOSIS — Z1231 Encounter for screening mammogram for malignant neoplasm of breast: Secondary | ICD-10-CM | POA: Insufficient documentation

## 2015-05-24 LAB — CBC AND DIFFERENTIAL
Hemoglobin: 12.6 g/dL (ref 12.0–16.0)
Platelets: 222 10*3/uL (ref 150–399)
WBC: 6.9 10^3/mL

## 2015-10-17 LAB — BASIC METABOLIC PANEL
BUN: 15 mg/dL (ref 4–21)
Creatinine: 0.9 mg/dL (ref 0.5–1.1)
POTASSIUM: 4.3 mmol/L (ref 3.4–5.3)
SODIUM: 143 mmol/L (ref 137–147)

## 2015-10-24 LAB — TSH: TSH: 3.4 u[IU]/mL (ref 0.41–5.90)

## 2015-10-24 LAB — HEMOGLOBIN A1C: Hemoglobin A1C: 5.7

## 2015-10-24 LAB — LIPID PANEL
Cholesterol: 184 mg/dL (ref 0–200)
HDL: 56 mg/dL (ref 35–70)
LDL CALC: 111 mg/dL
Triglycerides: 86 mg/dL (ref 40–160)

## 2015-11-01 ENCOUNTER — Encounter: Payer: Self-pay | Admitting: Nurse Practitioner

## 2015-11-01 ENCOUNTER — Ambulatory Visit (INDEPENDENT_AMBULATORY_CARE_PROVIDER_SITE_OTHER): Payer: Medicare Other | Admitting: Nurse Practitioner

## 2015-11-01 VITALS — BP 132/78 | HR 76 | Temp 98.0°F | Ht 61.0 in | Wt 140.0 lb

## 2015-11-01 DIAGNOSIS — Z7189 Other specified counseling: Secondary | ICD-10-CM

## 2015-11-01 DIAGNOSIS — I519 Heart disease, unspecified: Secondary | ICD-10-CM

## 2015-11-01 DIAGNOSIS — F4321 Adjustment disorder with depressed mood: Secondary | ICD-10-CM

## 2015-11-01 DIAGNOSIS — I5189 Other ill-defined heart diseases: Secondary | ICD-10-CM

## 2015-11-01 DIAGNOSIS — Z7689 Persons encountering health services in other specified circumstances: Secondary | ICD-10-CM

## 2015-11-01 MED ORDER — BUSPIRONE HCL 5 MG PO TABS: 5.0000 mg | ORAL_TABLET | Freq: Two times a day (BID) | ORAL | Status: DC

## 2015-11-01 NOTE — Progress Notes (Signed)
Pre visit review using our clinic review tool, if applicable. No additional management support is needed unless otherwise documented below in the visit note. 

## 2015-11-01 NOTE — Patient Instructions (Addendum)
Welcome to [facility]! Nice to meet you.   See you in 4 weeks to follow up on your medication.    Main Office [address] [place], [place] [address] General Information Phone: [phone] Toll-free: [phone]  [email or web address] is the website to look at their calendar

## 2015-11-01 NOTE — Progress Notes (Signed)
Patient ID: Kimberly Rojas, female    DOB: 24-Jan-1946  Age: 70 y.o. MRN: [id number]  CC: Establish Care   HPI DEYANNA Rojas presents for establishing care and CC of grief.   Colon 2011 at the hospital  Mammo 9/16  Flu 10/16  Pneumovax- ? Had 1st unknown date   1) New Pt Info:   Immunizations- UTD  Mammogram- 10/16  Bone Density- ?  Colonoscopy- 2011  Labs- some done in 9/16 and 2/17 see CareEverywhere  2) Chronic Problems-  Heart Murmur- Dr. Dareen Piano ordered and Mild RV systolic dysfxn   3) Acute Problems-  Husband passed 2 weeks ago per pt. MI   Night is terrible for her- benadryl last night helpful   Morning wakes up and feels anxious  Pt care team: Dr. Bluford Kaufmann- GI  Dr. Yves Dill- Physiatry  Dr. Charlsie Merles- Podiatry  Dr. Tora Kindred- [facility] every year    History Kimberly Rojas has a past medical history of Heart murmur; Arthritis; Chicken pox; Heartburn; Migraines; Peptic ulcer; and Hyperlipidemia.   She has past surgical history that includes Cholecystectomy; Breast surgery; Shoulder open rotator cuff repair (Right, 03/31/2013); Breast biopsy (Left, 2010); Tonsillectomy and adenoidectomy; and Dilation and curettage of uterus.   Her family history includes Arthritis in her mother; Breast cancer (age of onset: 88) in her mother; Diabetes in her father and paternal grandfather.She reports that she has never smoked. She has never used smokeless tobacco. She reports that she does not drink alcohol or use illicit drugs.  Outpatient Prescriptions Prior to Visit  Medication Sig Dispense Refill  . Multiple Vitamins-Minerals (MULTIVITAMIN PO) Take by mouth daily.    . Cholecalciferol (VITAMIN D PO) Take by mouth daily.    . traMADol (ULTRAM) 50 MG tablet Take by mouth daily.     No facility-administered medications prior to visit.    ROS Review of Systems  Constitutional: Negative for fever, chills, diaphoresis and fatigue.  Respiratory: Negative for chest tightness, shortness of  breath and wheezing.   Cardiovascular: Negative for chest pain, palpitations and leg swelling.  Gastrointestinal: Negative for nausea, vomiting and diarrhea.  Skin: Negative for rash.  Neurological: Negative for dizziness, weakness, numbness and headaches.  Psychiatric/Behavioral: Positive for sleep disturbance. Negative for suicidal ideas. The patient is nervous/anxious.     Objective:  BP 132/78 mmHg  Pulse 76  Temp(Src) 98 F (36.7 C) (Oral)  Ht  (1.549 m)  Wt 140 lb (63.504 kg)  BMI 26.47 kg/m2  SpO2 99%  Physical Exam  Constitutional: She is oriented to person, place, and time. She appears well-developed and well-nourished. No distress.  HENT:  Head: Normocephalic and atraumatic.  Right Ear: External ear normal.  Left Ear: External ear normal.  Cardiovascular: Normal rate and regular rhythm.  Exam reveals no gallop and no friction rub.   Murmur heard. Pulmonary/Chest: Effort normal and breath sounds normal. No respiratory distress. She has no wheezes. She has no rales. She exhibits no tenderness.  Neurological: She is alert and oriented to person, place, and time. No cranial nerve deficit. She exhibits normal muscle tone. Coordination normal.  Skin: Skin is warm and dry. No rash noted. She is not diaphoretic.  Psychiatric: She has a normal mood and affect. Her behavior is normal. Judgment and thought content normal.   Assessment & Plan:   Anouk was seen today for establish care.  Diagnoses and all orders for this visit:  Chronic systolic dysfunction of right ventricle  Encounter to establish care  Grief  Other orders -     busPIRone (BUSPAR) 5 MG tablet; Take 1 tablet (5 mg total) by mouth 2 (two) times daily.  I have discontinued Ms. Rahl's traMADol and Cholecalciferol (VITAMIN D PO). I am also having her start on busPIRone. Additionally, I am having her maintain her Multiple Vitamins-Minerals (MULTIVITAMIN PO), omeprazole, and cholecalciferol.  Meds  ordered this encounter  Medications  . omeprazole (PRILOSEC) 20 MG capsule    Sig: Take by mouth.  . cholecalciferol (VITAMIN D) 1000 units tablet    Sig: Take 1,000 Units by mouth daily.  . busPIRone (BUSPAR) 5 MG tablet    Sig: Take 1 tablet (5 mg total) by mouth 2 (two) times daily.    Dispense:  60 tablet    Refill:  0    Order Specific Question:  Supervising Provider    Answer:  Sherlene Shams [[id number]]     Follow-up: Return in about 4 weeks (around 11/29/2015) for Follow up.

## 2015-11-05 DIAGNOSIS — I5189 Other ill-defined heart diseases: Secondary | ICD-10-CM | POA: Insufficient documentation

## 2015-11-05 DIAGNOSIS — Z0000 Encounter for general adult medical examination without abnormal findings: Secondary | ICD-10-CM | POA: Insufficient documentation

## 2015-11-05 DIAGNOSIS — I519 Heart disease, unspecified: Secondary | ICD-10-CM | POA: Insufficient documentation

## 2015-11-05 DIAGNOSIS — Z0001 Encounter for general adult medical examination with abnormal findings: Secondary | ICD-10-CM | POA: Insufficient documentation

## 2015-11-05 DIAGNOSIS — F4321 Adjustment disorder with depressed mood: Secondary | ICD-10-CM | POA: Insufficient documentation

## 2015-11-05 NOTE — Assessment & Plan Note (Signed)
Recent loss of husband Pt would like to talk with hospice counselors for bereavement Website/address/phone number given on AVS Buspar 5 mg to be used a night and prn during the day up to 3 x daily sent to pharmacy FU in 4 weeks

## 2015-11-05 NOTE — Assessment & Plan Note (Signed)
Discussed acute and chronic issues. Reviewed health maintenance measures, PFSHx, and immunizations. Obtain records from previous facility.   

## 2015-11-27 ENCOUNTER — Ambulatory Visit (INDEPENDENT_AMBULATORY_CARE_PROVIDER_SITE_OTHER): Payer: Medicare Other | Admitting: Nurse Practitioner

## 2015-11-27 ENCOUNTER — Encounter: Payer: Self-pay | Admitting: Nurse Practitioner

## 2015-11-27 VITALS — BP 110/64 | HR 72 | Temp 97.8°F | Resp 14 | Ht 61.0 in | Wt 138.2 lb

## 2015-11-27 DIAGNOSIS — F4321 Adjustment disorder with depressed mood: Secondary | ICD-10-CM

## 2015-11-27 MED ORDER — BUSPIRONE HCL 7.5 MG PO TABS: 7.5000 mg | ORAL_TABLET | Freq: Three times a day (TID) | ORAL | Status: DC

## 2015-11-27 NOTE — Progress Notes (Signed)
Patient ID: Kimberly Rojas, female    DOB: March 31, 1946  Age: 70 y.o. MRN: [id number]  CC: Follow-up   HPI Kimberly Rojas presents for follow up of medications.   1) Went away on a girls trip this weekend and it was very helpful.  Slept better last night at home, which she was previously anxious about returning home. Taking only once a day and sometimes two  Wanting to try the next dosage up  History Kimberly Rojas has a past medical history of Heart murmur; Arthritis; Chicken pox; Heartburn; Migraines; Peptic ulcer; and Hyperlipidemia.   She has past surgical history that includes Cholecystectomy; Breast surgery; Shoulder open rotator cuff repair (Right, 03/31/2013); Breast biopsy (Left, 2010); Tonsillectomy and adenoidectomy; and Dilation and curettage of uterus.   Her family history includes Arthritis in her mother; Breast cancer (age of onset: 38) in her mother; Diabetes in her father and paternal grandfather.She reports that she has never smoked. She has never used smokeless tobacco. She reports that she does not drink alcohol or use illicit drugs.  Outpatient Prescriptions Prior to Visit  Medication Sig Dispense Refill  . cholecalciferol (VITAMIN D) 1000 units tablet Take 1,000 Units by mouth daily.    . Multiple Vitamins-Minerals (MULTIVITAMIN PO) Take by mouth daily.    Marland Kitchen omeprazole (PRILOSEC) 20 MG capsule Take by mouth.    . busPIRone (BUSPAR) 5 MG tablet Take 1 tablet (5 mg total) by mouth 2 (two) times daily. 60 tablet 0   No facility-administered medications prior to visit.    ROS Review of Systems  Constitutional: Negative for fever, chills, diaphoresis, activity change, appetite change, fatigue and unexpected weight change.  Eyes: Negative for visual disturbance.  Respiratory: Negative for chest tightness and shortness of breath.   Cardiovascular: Negative for chest pain.  Gastrointestinal: Negative for nausea, vomiting and diarrhea.  Neurological: Negative for headaches.   Psychiatric/Behavioral: Positive for sleep disturbance. Negative for suicidal ideas. The patient is nervous/anxious.        Improving    Objective:  BP 110/64 mmHg  Pulse 72  Temp(Src) 97.8 F (36.6 C) (Oral)  Resp 14  Ht  (1.549 m)  Wt 138 lb 3.2 oz (62.687 kg)  BMI 26.13 kg/m2  SpO2 97%  Physical Exam  Constitutional: She is oriented to person, place, and time. She appears well-developed and well-nourished. No distress.  HENT:  Head: Normocephalic and atraumatic.  Right Ear: External ear normal.  Left Ear: External ear normal.  Cardiovascular: Normal rate, regular rhythm and normal heart sounds.   Pulmonary/Chest: Effort normal and breath sounds normal. No respiratory distress. She has no wheezes. She has no rales. She exhibits no tenderness.  Neurological: She is alert and oriented to person, place, and time. No cranial nerve deficit. She exhibits normal muscle tone. Coordination normal.  Skin: Skin is warm and dry. No rash noted. She is not diaphoretic.  Psychiatric: She has a normal mood and affect. Her behavior is normal. Judgment and thought content normal.   Assessment & Plan:   Nesa was seen today for follow-up.  Diagnoses and all orders for this visit:  Grief  Other orders -     busPIRone (BUSPAR) 7.5 MG tablet; Take 1 tablet (7.5 mg total) by mouth 3 (three) times daily.  I have discontinued Ms. Genis's busPIRone. I am also having her start on busPIRone. Additionally, I am having her maintain her Multiple Vitamins-Minerals (MULTIVITAMIN PO), omeprazole, and cholecalciferol.  Meds ordered this encounter  Medications  .  busPIRone (BUSPAR) 7.5 MG tablet    Sig: Take 1 tablet (7.5 mg total) by mouth 3 (three) times daily.    Dispense:  90 tablet    Refill:  0    Order Specific Question:  Supervising Provider    Answer:  Sherlene ShamsULLO, TERESA L [[id number]]     Follow-up: Return if symptoms worsen or fail to improve.

## 2015-11-29 NOTE — Assessment & Plan Note (Signed)
Stable currently.  Upped Buspar to 7.5 mg to see if this is more helpful FU prn worsening/failure to improve.

## 2015-11-30 ENCOUNTER — Telehealth: Payer: Self-pay | Admitting: *Deleted

## 2015-11-30 NOTE — Telephone Encounter (Signed)
Per Henrene PastorKathy Davis, Dr Darrick Huntsmanullo will accept as a New Patient.

## 2016-01-12 ENCOUNTER — Encounter: Payer: Self-pay | Admitting: Internal Medicine

## 2016-01-12 ENCOUNTER — Ambulatory Visit (INDEPENDENT_AMBULATORY_CARE_PROVIDER_SITE_OTHER): Payer: Medicare Other | Admitting: Internal Medicine

## 2016-01-12 VITALS — BP 126/80 | HR 64 | Resp 12 | Ht 60.75 in | Wt 136.6 lb

## 2016-01-12 DIAGNOSIS — I10 Essential (primary) hypertension: Secondary | ICD-10-CM

## 2016-01-12 DIAGNOSIS — F4321 Adjustment disorder with depressed mood: Secondary | ICD-10-CM

## 2016-01-12 DIAGNOSIS — R251 Tremor, unspecified: Secondary | ICD-10-CM | POA: Diagnosis not present

## 2016-01-12 NOTE — Patient Instructions (Signed)
You may be having low blood sugars when you feel weak  I recommend starting the day with a high protein breakfast and avoid sweet tea and other concentrated sweets    You might want to try a premixed protein drink called Premier Protein shake in the morning.  It is less $$$ and very low sugar.    160 cal  30 g protein  1 g sugar 50% of your daily calcium needs    Vilma MeckelJimmy Dean now makes a frozen breakfast frittata that can be microwaved in 2 minutes and is very low carb. Frittats are similar to quiches without the crust  I am referring you to Grief Counselling and would like to see you back in a month

## 2016-01-12 NOTE — Progress Notes (Signed)
Subjective:  Patient ID: Kimberly Rojas, female    DOB: [birth year]  Age: 70 y.o. MRN: [id number]  CC: The primary encounter diagnosis was Grief. Diagnoses of Essential (primary) hypertension and Tremulousness were also pertinent to this visit.  HPI Kimberly Rojas presents for follow up on grief with anxiety and insomnia. .  Husband died suddenly in February during a collapse at home.  She attempted to revive him with CPR until EMS arrive.  EMS got a pulse,  He was taken to ER.  But he was not able to be revived.     Kimberly Rojas  Is a former patient of Naomie DeanCarrie Doss.  She was first seen on March 1 and buspirone was started at 5 mg tid and was  increased to 7.5 mg daily in late march with improvement in symptoms but having recurrent vertigo.  Uses benadryl  episodes last week of feeling shaky after fasting , occurred 3 times in the last few weeks .  Occurs when fasting .  No appetite,  Doesn't skip breakfast.  [facility]; the  episodes happen after swimming,  and sometimes in the afternoon  Profound fatigue   Thyroid and A1c checked in Feb 21 by PCP in   lalsst CBC sept  Bed by 11 pm   Wakes up at 3 am ,  Can't go back to sleep  , mind starts worrying .  Has supportive friends,  Son every weekend.   Family in in [place].  Gest lonely at night ,  Has friend Velta Addisonnn Hughes.      Grief counselling needed /requested.    Had  lifeline screening due to FH of AAA (mother was a smoker )     Outpatient Prescriptions Prior to Visit  Medication Sig Dispense Refill  . busPIRone (BUSPAR) 7.5 MG tablet Take 1 tablet (7.5 mg total) by mouth 3 (three) times daily. 90 tablet 0  . cholecalciferol (VITAMIN D) 1000 units tablet Take 1,000 Units by mouth daily.    . Multiple Vitamins-Minerals (MULTIVITAMIN PO) Take by mouth daily.    Marland Kitchen. omeprazole (PRILOSEC) 20 MG capsule Take by mouth.     No facility-administered medications prior to visit.    Review of Systems;  Patient denies  headache, fevers, malaise, unintentional weight loss, skin rash, eye pain, sinus congestion and sinus pain, sore throat, dysphagia,  hemoptysis , cough, dyspnea, wheezing, chest pain, palpitations, orthopnea, edema, abdominal pain, nausea, melena, diarrhea, constipation, flank pain, dysuria, hematuria, urinary  Frequency, nocturia, numbness, tingling, seizures,  Focal weakness, Loss of consciousness,  Tremor, insomnia, depression, anxiety, and suicidal ideation.      Objective:  BP 126/80 mmHg  Pulse 64  Resp 12  Ht 5' 0.75" (1.543 m)  Wt 136 lb 9.6 oz (61.961 kg)  BMI 26.02 kg/m2  BP Readings from Last 3 Encounters:  01/12/16 126/80  11/27/15 110/64  11/01/15 132/78    Wt Readings from Last 3 Encounters:  01/12/16 136 lb 9.6 oz (61.961 kg)  11/27/15 138 lb 3.2 oz (62.687 kg)  11/01/15 140 lb (63.504 kg)    General appearance: alert, cooperative and appears stated age Ears: normal TM's and external ear canals both ears Throat: lips, mucosa, and tongue normal; teeth and gums normal Neck: no adenopathy, no carotid bruit, supple, symmetrical, trachea midline and thyroid not enlarged, symmetric, no tenderness/mass/nodules Back: symmetric, no curvature. ROM normal. No CVA tenderness. Lungs: clear to auscultation bilaterally Heart: regular rate and rhythm,  S1, S2 normal, no murmur, click, rub or gallop Abdomen: soft, non-tender; bowel sounds normal; no masses,  no organomegaly Pulses: 2+ and symmetric Skin: Skin color, texture, turgor normal. No rashes or lesions Lymph nodes: Cervical, supraclavicular, and axillary nodes normal.  No results found for: HGBA1C  Lab Results  Component Value Date   CREATININE 0.9 10/17/2015   CREATININE 0.82 03/26/2013   CREATININE 0.70 10/29/2012    Lab Results  Component Value Date   WBC 7.2 03/26/2013   HGB 12.8 03/26/2013   HCT 37.9 03/26/2013   PLT 257 03/26/2013   GLUCOSE 123* 03/26/2013   ALT 12 03/26/2013   AST 17 03/26/2013   NA  143 10/17/2015   K 4.3 10/17/2015   CL 102 03/26/2013   CREATININE 0.9 10/17/2015   BUN 15 10/17/2015   CO2 29 03/26/2013   INR 1.03 03/26/2013    Mm Screening Breast Tomo Bilateral  05/25/2015  CLINICAL DATA:  Screening. EXAM: DIGITAL SCREENING BILATERAL MAMMOGRAM WITH 3D TOMO WITH CAD COMPARISON:  Previous exam(s). ACR Breast Density Category b: There are scattered areas of fibroglandular density. FINDINGS: There are no findings suspicious for malignancy. Images were processed with CAD. IMPRESSION: No mammographic evidence of malignancy. A result letter of this screening mammogram will be mailed directly to the patient. RECOMMENDATION: Screening mammogram in one year. (Code:SM-B-01Y) BI-RADS CATEGORY  1: Negative. Electronically Signed   By: Ted Mcalpine M.D.   On: 05/25/2015 08:40    Assessment & Plan:   Problem List Items Addressed This Visit    Essential (primary) hypertension    Well controlled on current regimen. Renal function stable, no changes today.      Grief - Primary    Patient is dealing with the unexpected loss of spouse and has adequate coping skills and emotional support .Referral to grief counselling.   i have asked patient to return in one month to examine for signs of unresolving grief.       Relevant Orders   Ambulatory referral to Psychology   Tremulousness      I am having Kimberly. Sharrow maintain her Multiple Vitamins-Minerals (MULTIVITAMIN PO), omeprazole, cholecalciferol, and busPIRone.  No orders of the defined types were placed in this encounter.    There are no discontinued medications.  Follow-up: No Follow-up on file.   Sherlene Shams, MD

## 2016-01-14 DIAGNOSIS — R251 Tremor, unspecified: Secondary | ICD-10-CM | POA: Insufficient documentation

## 2016-01-14 NOTE — Assessment & Plan Note (Signed)
Well controlled on current regimen. Renal function stable, no changes today. 

## 2016-01-14 NOTE — Assessment & Plan Note (Addendum)
Patient is dealing with the unexpected loss of spouse and has adequate coping skills and emotional support .Referral to grief counselling.   i have asked patient to return in one month to examine for signs of unresolving grief.

## 2016-01-14 NOTE — Assessment & Plan Note (Signed)
Unclear if epsiodes are due to hypotension or hypoglycemia.   Diet and meds disuccsed.

## 2016-02-13 ENCOUNTER — Ambulatory Visit (INDEPENDENT_AMBULATORY_CARE_PROVIDER_SITE_OTHER): Payer: Medicare Other | Admitting: Internal Medicine

## 2016-02-13 ENCOUNTER — Encounter: Payer: Self-pay | Admitting: Internal Medicine

## 2016-02-13 VITALS — BP 144/80 | HR 69 | Temp 98.1°F | Resp 12 | Ht 61.0 in | Wt 133.5 lb

## 2016-02-13 DIAGNOSIS — F4321 Adjustment disorder with depressed mood: Secondary | ICD-10-CM

## 2016-02-13 DIAGNOSIS — Z1239 Encounter for other screening for malignant neoplasm of breast: Secondary | ICD-10-CM | POA: Diagnosis not present

## 2016-02-13 MED ORDER — MIRTAZAPINE 7.5 MG PO TABS: 7.5000 mg | ORAL_TABLET | Freq: Every day | ORAL | Status: DC

## 2016-02-13 NOTE — Progress Notes (Signed)
Pre-visit discussion using our clinic review tool. No additional management support is needed unless otherwise documented below in the visit note.  

## 2016-02-13 NOTE — Progress Notes (Addendum)
Subjective:  Patient ID: Kimberly Rojas, female    DOB: Mar 02, 1946  Age: 70 y.o. MRN: [id number]  CC: The primary encounter diagnosis was Breast cancer screening. A diagnosis of Grief was also pertinent to this visit.  HPI Kimberly Rojas presents for follow up on grief and tremulousness followig the unexpected death of her beloved husband in February.  At her last visit one month ago,  busprione started at 5 mg tid,.  Sjhe has had no significant improvement in her anxiety and has had Continued weight loss.  Appetite minimal, but eating 3 times daily.    Has appt with grief counsellor Colen DarlingLisa flores coming up.  CC : fatigue. Wakes up tired.  No trouble fallling asleep  Discussed trial of remeron .   Due for mammo late september colooscopy 4 years ago 10 yr follow up    Outpatient Prescriptions Prior to Visit  Medication Sig Dispense Refill  . cholecalciferol (VITAMIN D) 1000 units tablet Take 1,000 Units by mouth daily.    . Multiple Vitamins-Minerals (MULTIVITAMIN PO) Take by mouth daily.    Marland Kitchen. omeprazole (PRILOSEC) 20 MG capsule Take by mouth.    . busPIRone (BUSPAR) 7.5 MG tablet Take 1 tablet (7.5 mg total) by mouth 3 (three) times daily. 90 tablet 0   No facility-administered medications prior to visit.    Review of Systems;  Patient denies headache, fevers, malaise,, skin rash, eye pain, sinus congestion and sinus pain, sore throat, dysphagia,  hemoptysis , cough, dyspnea, wheezing, chest pain, palpitations, orthopnea, edema, abdominal pain, nausea, melena, diarrhea, constipation, flank pain, dysuria, hematuria, urinary  Frequency, nocturia, numbness, tingling, seizures,  Focal weakness, Loss of consciousness,  Tremor, insomnia,, and suicidal ideation.      Objective:  BP 144/80 mmHg  Pulse 69  Temp(Src) 98.1 F (36.7 C) (Oral)  Resp 12  Ht 5\' 1"  (1.549 m)  Wt 133 lb 8 oz (60.555 kg)  BMI 25.24 kg/m2  SpO2 98%  BP Readings from Last 3 Encounters:  02/13/16 144/80    01/12/16 126/80  11/27/15 110/64    Wt Readings from Last 3 Encounters:  02/13/16 133 lb 8 oz (60.555 kg)  01/12/16 136 lb 9.6 oz (61.961 kg)  11/27/15 138 lb 3.2 oz (62.687 kg)    General appearance: alert, cooperative and appears stated age Ears: normal TM's and external ear canals both ears Throat: lips, mucosa, and tongue normal; teeth and gums normal Neck: no adenopathy, no carotid bruit, supple, symmetrical, trachea midline and thyroid not enlarged, symmetric, no tenderness/mass/nodules Back: symmetric, no curvature. ROM normal. No CVA tenderness. Lungs: clear to auscultation bilaterally Heart: regular rate and rhythm, S1, S2 normal, no murmur, click, rub or gallop Abdomen: soft, non-tender; bowel sounds normal; no masses,  no organomegaly Pulses: 2+ and symmetric Skin: Skin color, texture, turgor normal. No rashes or lesions Lymph nodes: Cervical, supraclavicular, and axillary nodes normal.  No results found for: HGBA1C  Lab Results  Component Value Date   CREATININE 0.9 10/17/2015   CREATININE 0.82 03/26/2013   CREATININE 0.70 10/29/2012    Lab Results  Component Value Date   WBC 7.2 03/26/2013   HGB 12.8 03/26/2013   HCT 37.9 03/26/2013   PLT 257 03/26/2013   GLUCOSE 123* 03/26/2013   ALT 12 03/26/2013   AST 17 03/26/2013   NA 143 10/17/2015   K 4.3 10/17/2015   CL 102 03/26/2013   CREATININE 0.9 10/17/2015   BUN 15 10/17/2015   CO2 29 03/26/2013  INR 1.03 03/26/2013    Mm Screening Breast Tomo Bilateral  05/25/2015  CLINICAL DATA:  Screening. EXAM: DIGITAL SCREENING BILATERAL MAMMOGRAM WITH 3D TOMO WITH CAD COMPARISON:  Previous exam(s). ACR Breast Density Category b: There are scattered areas of fibroglandular density. FINDINGS: There are no findings suspicious for malignancy. Images were processed with CAD. IMPRESSION: No mammographic evidence of malignancy. A result letter of this screening mammogram will be mailed directly to the patient.  RECOMMENDATION: Screening mammogram in one year. (Code:SM-B-01Y) BI-RADS CATEGORY  1: Negative. Electronically Signed   By: Ted Mcalpine M.D.   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         Other Visit Diagnoses    Breast cancer screening    -  Primary    Relevant Orders    MM DIGITAL SCREENING BILATERAL      A total of 25 minutes of face to face time was spent with patient more than half of which was spent in counselling about the above mentioned conditions  and coordination of care   I have discontinued Kimberly Rojas's busPIRone. I am also having her start on mirtazapine. Additionally, I am having her maintain her Multiple Vitamins-Minerals (MULTIVITAMIN PO), omeprazole, and cholecalciferol.  Meds ordered this encounter  Medications  . mirtazapine (REMERON) 7.5 MG tablet    Sig: Take 1 tablet (7.5 mg total) by mouth at bedtime.    Dispense:  90 tablet    Refill:  1    Medications Discontinued During This Encounter  Medication Reason  . busPIRone (BUSPAR) 7.5 MG tablet     Follow-up: Return in about 3 months (around 05/15/2016).   Sherlene Shams, MD

## 2016-02-13 NOTE — Patient Instructions (Addendum)
We discussed stopping buspirone and starting an antidepressant called "Remeron " (mirtazipine) to help improve your sleep, energy and appetite.  Start with 7.5 mg  Daily at bedtime or 1  Hour prior  Increase to 15 mg after one week   Return in 3 months  Sooner if your mood worsens   Mirtazapine tablets What is this medicine? MIRTAZAPINE (mir TAZ a peen) is used to treat depression. This medicine may be used for other purposes; ask your health care provider or pharmacist if you have questions. What should I tell my health care provider before I take this medicine? They need to know if you have any of these conditions: -bipolar disorder -glaucoma -kidney disease -liver disease -suicidal thoughts -an unusual or allergic reaction to mirtazapine, other medicines, foods, dyes, or preservatives -pregnant or trying to get pregnant -breast-feeding How should I use this medicine? Take this medicine by mouth with a glass of water. Follow the directions on the prescription label. Take your medicine at regular intervals. Do not take your medicine more often than directed. Do not stop taking this medicine suddenly except upon the advice of your doctor. Stopping this medicine too quickly may cause serious side effects or your condition may worsen. A special MedGuide will be given to you by the pharmacist with each prescription and refill. Be sure to read this information carefully each time. Talk to your pediatrician regarding the use of this medicine in children. Special care may be needed. Overdosage: If you think you have taken too much of this medicine contact a poison control center or emergency room at once. NOTE: This medicine is only for you. Do not share this medicine with others. What if I miss a dose? If you miss a dose, take it as soon as you can. If it is almost time for your next dose, take only that dose. Do not take double or extra doses. What may interact with this medicine? Do not  take this medicine with any of the following medications: -linezolid -MAOIs like Carbex, Eldepryl, Marplan, Nardil, and Parnate -methylene blue (injected into a vein) This medicine may also interact with the following medications: -alcohol -antiviral medicines for HIV or AIDS -certain medicines that treat or prevent blood clots like warfarin -certain medicines for depression, anxiety, or psychotic disturbances -certain medicines for fungal infections like ketoconazole and itraconazole -certain medicines for migraine headache like almotriptan, eletriptan, frovatriptan, naratriptan, rizatriptan, sumatriptan, zolmitriptan -certain medicines for seizures like carbamazepine or phenytoin -certain medicines for sleep -cimetidine -erythromycin -fentanyl -lithium -medicines for blood pressure -nefazodone -rasagiline -rifampin -supplements like St. John's wort, kava kava, valerian -tramadol -tryptophan This list may not describe all possible interactions. Give your health care provider a list of all the medicines, herbs, non-prescription drugs, or dietary supplements you use. Also tell them if you smoke, drink alcohol, or use illegal drugs. Some items may interact with your medicine. What should I watch for while using this medicine? Tell your doctor if your symptoms do not get better or if they get worse. Visit your doctor or health care professional for regular checks on your progress. Because it may take several weeks to see the full effects of this medicine, it is important to continue your treatment as prescribed by your doctor. Patients and their families should watch out for new or worsening thoughts of suicide or depression. Also watch out for sudden changes in feelings such as feeling anxious, agitated, panicky, irritable, hostile, aggressive, impulsive, severely restless, overly excited and hyperactive, or not  being able to sleep. If this happens, especially at the beginning of treatment  or after a change in dose, call your health care professional. Bonita QuinYou may get drowsy or dizzy. Do not drive, use machinery, or do anything that needs mental alertness until you know how this medicine affects you. Do not stand or sit up quickly, especially if you are an older patient. This reduces the risk of dizzy or fainting spells. Alcohol may interfere with the effect of this medicine. Avoid alcoholic drinks. This medicine may cause dry eyes and blurred vision. If you wear contact lenses you may feel some discomfort. Lubricating drops may help. See your eye doctor if the problem does not go away or is severe. Your mouth may get dry. Chewing sugarless gum or sucking hard candy, and drinking plenty of water may help. Contact your doctor if the problem does not go away or is severe. What side effects may I notice from receiving this medicine? Side effects that you should report to your doctor or health care professional as soon as possible: -allergic reactions like skin rash, itching or hives, swelling of the face, lips, or tongue -breathing problems -confusion -fever, sore throat, or mouth ulcers or blisters -flu like symptoms including fever, chills, cough, muscle or joint aches and pains -stomach pain with nausea and/or vomiting -suicidal thoughts or other mood changes -swelling of the hands or feet -unusual bleeding or bruising -unusually weak or tired -vomiting Side effects that usually do not require medical attention (report to your doctor or health care professional if they continue or are bothersome): -constipation -increased appetite -weight gain This list may not describe all possible side effects. Call your doctor for medical advice about side effects. You may report side effects to FDA at 1-800-FDA-1088. Where should I keep my medicine? Keep out of the reach of children. Store at room temperature between 15 and 30 degrees C (59 and 86 degrees F) Protect from light and moisture.  Throw away any unused medicine after the expiration date. NOTE: This sheet is a summary. It may not cover all possible information. If you have questions about this medicine, talk to your doctor, pharmacist, or health care provider.    2016, Elsevier/Gold Standard. (2013-03-12 13:11:19)

## 2016-02-14 NOTE — Assessment & Plan Note (Signed)
Discussed trial of remeron for lack of appetite  And anxiety secondary to grief.

## 2016-02-21 ENCOUNTER — Ambulatory Visit (INDEPENDENT_AMBULATORY_CARE_PROVIDER_SITE_OTHER): Payer: Medicare Other | Admitting: Internal Medicine

## 2016-02-21 ENCOUNTER — Encounter: Payer: Self-pay | Admitting: Internal Medicine

## 2016-02-21 VITALS — BP 144/72 | HR 78 | Temp 97.9°F | Resp 12 | Ht 61.0 in | Wt 135.2 lb

## 2016-02-21 DIAGNOSIS — E039 Hypothyroidism, unspecified: Secondary | ICD-10-CM | POA: Diagnosis not present

## 2016-02-21 DIAGNOSIS — F4321 Adjustment disorder with depressed mood: Secondary | ICD-10-CM | POA: Diagnosis not present

## 2016-02-21 NOTE — Progress Notes (Signed)
Pre-visit discussion using our clinic review tool. No additional management support is needed unless otherwise documented below in the visit note.  

## 2016-02-21 NOTE — Progress Notes (Signed)
Subjective:  Patient ID: Kimberly Rojas, female    DOB: 1945-10-08  Age: 70 y.o. MRN: [id number]  CC: The primary encounter diagnosis was Grief. A diagnosis of Acquired hypothyroidism was also pertinent to this visit.  HPI Kimberly Rojas presents for follow up on grief,  Accompanied by involuntary wight loss and anxiety folllowing the death of her husband. She returns for medication follow up.  thus far she has has had no improvement with trial of buspirone,  Did not tolerate 7.5 mg  dose of mirtazipine due to excessive sedation .  However, her ongoing weight loss has stopped  and she has gained 2 lbs since her last visit.  She reports a gradual improvement in appetite and is getting out more. She is planning a trip to [place] with her niece to see her family.  She is looking forward to the trip, and her niece is going to stay with her for a few days when she returns so in order to avoid her returning to an empty house of sad memories.      Outpatient Prescriptions Prior to Visit  Medication Sig Dispense Refill  . cholecalciferol (VITAMIN D) 1000 units tablet Take 1,000 Units by mouth daily.    . Multiple Vitamins-Minerals (MULTIVITAMIN PO) Take by mouth daily.    Marland Kitchen omeprazole (PRILOSEC) 20 MG capsule Take by mouth.    . mirtazapine (REMERON) 7.5 MG tablet Take 1 tablet (7.5 mg total) by mouth at bedtime. (Patient not taking: Reported on 02/21/2016) 90 tablet 1   No facility-administered medications prior to visit.    Review of Systems;  Patient denies headache, fevers, malaise, unintentional weight loss, skin rash, eye pain, sinus congestion and sinus pain, sore throat, dysphagia,  hemoptysis , cough, dyspnea, wheezing, chest pain, palpitations, orthopnea, edema, abdominal pain, nausea, melena, diarrhea, constipation, flank pain, dysuria, hematuria, urinary  Frequency, nocturia, numbness, tingling, seizures,  Focal weakness, Loss of consciousness,  Tremor, insomnia, depression, anxiety, and  suicidal ideation.      Objective:  BP 144/72 mmHg  Pulse 78  Temp(Src) 97.9 F (36.6 C) (Oral)  Resp 12  Ht  (1.549 m)  Wt 135 lb 4 oz (61.349 kg)  BMI 25.57 kg/m2  SpO2 98%  BP Readings from Last 3 Encounters:  02/21/16 144/72  02/13/16 144/80  01/12/16 126/80    Wt Readings from Last 3 Encounters:  02/21/16 135 lb 4 oz (61.349 kg)  02/13/16 133 lb 8 oz (60.555 kg)  01/12/16 136 lb 9.6 oz (61.961 kg)    General appearance: alert, cooperative and appears stated age Ears: normal TM's and external ear canals both ears Throat: lips, mucosa, and tongue normal; teeth and gums normal Neck: no adenopathy, no carotid bruit, supple, symmetrical, trachea midline and thyroid not enlarged, symmetric, no tenderness/mass/nodules Back: symmetric, no curvature. ROM normal. No CVA tenderness. Lungs: clear to auscultation bilaterally Heart: regular rate and rhythm, S1, S2 normal, no murmur, click, rub or gallop Abdomen: soft, non-tender; bowel sounds normal; no masses,  no organomegaly Pulses: 2+ and symmetric Skin: Skin color, texture, turgor normal. No rashes or lesions Lymph nodes: Cervical, supraclavicular, and axillary nodes normal.  Lab Results  Component Value Date   HGBA1C 5.7 10/24/2015    Lab Results  Component Value Date   CREATININE 0.9 10/17/2015   CREATININE 0.82 03/26/2013   CREATININE 0.70 10/29/2012    Lab Results  Component Value Date   WBC 6.9 05/24/2015   HGB 12.6 05/24/2015   HCT  37.9 03/26/2013   PLT 222 05/24/2015   GLUCOSE 123* 03/26/2013   CHOL 184 10/24/2015   TRIG 86 10/24/2015   HDL 56 10/24/2015   LDLCALC 111 10/24/2015   ALT 12 03/26/2013   AST 17 03/26/2013   NA 143 10/17/2015   K 4.3 10/17/2015   CL 102 03/26/2013   CREATININE 0.9 10/17/2015   BUN 15 10/17/2015   CO2 29 03/26/2013   TSH 3.40 10/24/2015   INR 1.03 03/26/2013   HGBA1C 5.7 10/24/2015    Mm Screening Breast Tomo Bilateral  05/25/2015  CLINICAL DATA:   Screening. EXAM: DIGITAL SCREENING BILATERAL MAMMOGRAM WITH 3D TOMO WITH CAD COMPARISON:  Previous exam(s). ACR Breast Density Category b: There are scattered areas of fibroglandular density. FINDINGS: There are no findings suspicious for malignancy. Images were processed with CAD. IMPRESSION: No mammographic evidence of malignancy. A result letter of this screening mammogram will be mailed directly to the patient. RECOMMENDATION: Screening mammogram in one year. (Code:SM-B-01Y) BI-RADS CATEGORY  1: Negative. Electronically Signed   By: Ted Mcalpineobrinka  Dimitrova M.D.   On: 05/25/2015 08:40    Assessment & Plan:   Problem List Items Addressed This Visit    Acquired hypothyroidism    Thyroid function is WNL on current dose.  No current changes needed.        Grief - Primary    Improving,  Intolerant of 2 medication trials of remeron for loss of appetite and buspirone for anxiety. No additional medications are warranted , her weight loss has stopped.  Has grief counselling set up.          I have discontinued Kimberly Rojas's mirtazapine. I am also having her maintain her Multiple Vitamins-Minerals (MULTIVITAMIN PO), omeprazole, and cholecalciferol.  No orders of the defined types were placed in this encounter.    Medications Discontinued During This Encounter  Medication Reason  . mirtazapine (REMERON) 7.5 MG tablet     Follow-up: No Follow-up on file.   Sherlene ShamsULLO, Amarria Andreasen L, MD

## 2016-02-24 NOTE — Assessment & Plan Note (Signed)
Improving,  Intolerant of 2 medication trials of remeron for loss of appetite and buspirone for anxiety. No additional medications are warranted , her weight loss has stopped.  Has grief counselling set up.

## 2016-02-24 NOTE — Assessment & Plan Note (Signed)
Thyroid function is WNL on current dose.  No current changes needed.  

## 2016-05-13 ENCOUNTER — Ambulatory Visit (INDEPENDENT_AMBULATORY_CARE_PROVIDER_SITE_OTHER): Payer: Medicare Other | Admitting: Family

## 2016-05-13 ENCOUNTER — Encounter: Payer: Self-pay | Admitting: Family

## 2016-05-13 VITALS — BP 156/76 | HR 77 | Temp 98.0°F | Wt 135.2 lb

## 2016-05-13 DIAGNOSIS — I10 Essential (primary) hypertension: Secondary | ICD-10-CM | POA: Diagnosis not present

## 2016-05-13 LAB — COMPREHENSIVE METABOLIC PANEL
ALK PHOS: 82 U/L (ref 39–117)
ALT: 14 U/L (ref 0–35)
AST: 19 U/L (ref 0–37)
Albumin: 4 g/dL (ref 3.5–5.2)
BUN: 17 mg/dL (ref 6–23)
CHLORIDE: 104 meq/L (ref 96–112)
CO2: 28 meq/L (ref 19–32)
Calcium: 9 mg/dL (ref 8.4–10.5)
Creatinine, Ser: 0.74 mg/dL (ref 0.40–1.20)
GFR: 81.96 mL/min (ref >60.00)
GLUCOSE: 104 mg/dL — AB (ref 70–99)
POTASSIUM: 4 meq/L (ref 3.5–5.1)
SODIUM: 138 meq/L (ref 135–145)
TOTAL PROTEIN: 7.1 g/dL (ref 6.0–8.3)
Total Bilirubin: 0.3 mg/dL (ref 0.2–1.2)

## 2016-05-13 MED ORDER — LISINOPRIL 10 MG PO TABS: 10.0000 mg | ORAL_TABLET | Freq: Every day | ORAL | 3 refills | Status: DC

## 2016-05-13 NOTE — Progress Notes (Signed)
Subjective:    Patient ID: Kimberly Rojas, female    DOB: [birth year], 70 y.o.   MRN: [id number]  CC: Kimberly Rojas is a 70 y.o. female who presents today for an acute visit.    HPI: Patient for follow-up on blood pressure. She states she was in urgent care over the weekend due to a wasp sting ( resolved), and they noticed her blood pressure was elevated.  HTN: no  h/o of HTN per patient. No h/o of being treated on HTN medications. Husband passed away 6 months ago and has been under stress. Denies exertional chest pain or pressure, numbness or tingling radiating to left arm or jaw, palpitations, dizziness, frequent headaches, changes in vision, or shortness of breath.      HISTORY:  Past Medical History:  Diagnosis Date  . Arthritis   . Chicken pox   . Heart murmur    hx of   . Heartburn   . Hyperlipidemia   . Migraines   . Peptic ulcer    Past Surgical History:  Procedure Laterality Date  . BREAST BIOPSY Left 2010   neg  . BREAST SURGERY     left breast biopsy   . CHOLECYSTECTOMY    . DILATION AND CURETTAGE OF UTERUS    . SHOULDER OPEN ROTATOR CUFF REPAIR Right 03/31/2013   Procedure: RIGHT ROTATOR CUFF REPAIR SHOULDER OPEN;  Surgeon: Jacki Conesonald A Gioffre, MD;  Location: [facility];  Service: Orthopedics;  Laterality: Right;  With [employer]  . TONSILLECTOMY AND ADENOIDECTOMY     Family History  Problem Relation Age of Onset  . Breast cancer Mother 2078  . Arthritis Mother   . Diabetes Father   . Diabetes Paternal Grandfather     Allergies: Ibuprofen Current Outpatient Prescriptions on File Prior to Visit  Medication Sig Dispense Refill  . cholecalciferol (VITAMIN D) 1000 units tablet Take 1,000 Units by mouth daily.    . Multiple Vitamins-Minerals (MULTIVITAMIN PO) Take by mouth daily.    Marland Kitchen. omeprazole (PRILOSEC) 20 MG capsule Take by mouth.     No current facility-administered medications on file prior to visit.     Social History  Substance Use Topics  .  Smoking status: Never Smoker  . Smokeless tobacco: Never Used  . Alcohol use No    Review of Systems  Constitutional: Negative for chills and fever.  Eyes: Negative for visual disturbance.  Respiratory: Negative for cough, shortness of breath and wheezing.   Cardiovascular: Negative for chest pain, palpitations and leg swelling.  Gastrointestinal: Negative for nausea and vomiting.  Neurological: Negative for dizziness, syncope, weakness and headaches.      Objective:    BP (!) 156/76   Pulse 77   Temp 98 F (36.7 C) (Oral)   Wt 135 lb 3.2 oz (61.3 kg)   SpO2 98%   BMI 25.55 kg/m   BP Readings from Last 3 Encounters:  05/13/16 (!) 156/76  02/21/16 (!) 144/72  02/13/16 (!) 144/80    Physical Exam  Constitutional: She appears well-developed and well-nourished.  Eyes: Conjunctivae are normal.  Cardiovascular: Normal rate, regular rhythm, normal heart sounds and normal pulses.   Pulmonary/Chest: Effort normal and breath sounds normal. She has no wheezes. She has no rhonchi. She has no rales.  Neurological: She is alert.  Skin: Skin is warm and dry.  Psychiatric: She has a normal mood and affect. Her speech is normal and behavior is normal. Thought content normal.  Vitals reviewed.  Assessment & Plan:   Problem List Items Addressed This Visit      Cardiovascular and Mediastinum   Essential (primary) hypertension    Patient has no symptoms of hypertensive urgency or emergency. Patient and I jointly decided to start medication today. Pending BMP. Patient sees PCP in one week for follow-up. Patient education provided regarding side effects from lisinopril. She will keep a BP log at home and notify us if blood pressure is notably elevated or low or she is having any symptoms.        Relevant Medications   lisinopril (PRINIVIL,ZESTRIL) 10 MG tablet    Other Visit Diagnoses    Essential hypertension    -  Primary   Relevant Medications   lisinopril (PRINIVIL,ZESTRIL)  10 MG tablet   Other Relevant Orders   Comprehensive metabolic panel        I am having Ms. Kostick start on lisinopril. I am also having her maintain her Multiple Vitamins-Minerals (MULTIVITAMIN PO), omeprazole, and cholecalciferol.   Meds ordered this encounter  Medications  . lisinopril (PRINIVIL,ZESTRIL) 10 MG tablet    Sig: Take 1 tablet (10 mg total) by mouth daily.    Dispense:  90 tablet    Refill:  3    Order Specific Question:   Supervising Provider    Answer:   Sherlene Shams [[id number]]    Return precautions given.   Risks, benefits, and alternatives of the medications and treatment plan prescribed today were discussed, and patient expressed understanding.   Education regarding symptom management and diagnosis given to patient on AVS.  Continue to follow with TULLO, Mar Daring, MD for routine health maintenance.   Kimberly Loa and I agreed with plan.   Rennie Plowman, FNP

## 2016-05-13 NOTE — Patient Instructions (Signed)
Please keep blood pressure log at home. Please let us know if blood pressures going above 150/0. The goal is less than 140/90.   Follow up with PCP in one week.   If there is no improvement in your symptoms, or if there is any worsening of symptoms, or if you have any additional concerns, please return for re-evaluation; or, if we are closed, consider going to the Emergency Room for evaluation if symptoms urgent.  Managing Your High Blood Pressure Blood pressure is a measurement of how forceful your blood is pressing against the walls of the arteries. Arteries are muscular tubes within the circulatory system. Blood pressure does not stay the same. Blood pressure rises when you are active, excited, or nervous; and it lowers during sleep and relaxation. If the numbers measuring your blood pressure stay above normal most of the time, you are at risk for health problems. High blood pressure (hypertension) is a long-term (chronic) condition in which blood pressure is elevated. A blood pressure reading is recorded as two numbers, such as 120 over 80 (or 120/80). The first, higher number is called the systolic pressure. It is a measure of the pressure in your arteries as the heart beats. The second, lower number is called the diastolic pressure. It is a measure of the pressure in your arteries as the heart relaxes between beats.  Keeping your blood pressure in a normal range is important to your overall health and prevention of health problems, such as heart disease and stroke. When your blood pressure is uncontrolled, your heart has to work harder than normal. High blood pressure is a very common condition in adults because blood pressure tends to rise with age. Men and women are equally likely to have hypertension but at different times in life. Before age 72, men are more likely to have hypertension. After 72 years of age, women are more likely to have it. Hypertension is especially common in African  Americans. This condition often has no signs or symptoms. The cause of the condition is usually not known. Your caregiver can help you come up with a plan to keep your blood pressure in a normal, healthy range. BLOOD PRESSURE STAGES Blood pressure is classified into four stages: normal, prehypertension, stage 1, and stage 2. Your blood pressure reading will be used to determine what type of treatment, if any, is necessary. Appropriate treatment options are tied to these four stages:  Normal  Systolic pressure (mm Hg): below 120.  Diastolic pressure (mm Hg): below 80. Prehypertension  Systolic pressure (mm Hg): 120 to 139.  Diastolic pressure (mm Hg): 80 to 89. Stage1  Systolic pressure (mm Hg): 140 to 159.  Diastolic pressure (mm Hg): 90 to 99. Stage2  Systolic pressure (mm Hg): 160 or above.  Diastolic pressure (mm Hg): 100 or above. RISKS RELATED TO HIGH BLOOD PRESSURE Managing your blood pressure is an important responsibility. Uncontrolled high blood pressure can lead to:  A heart attack.  A stroke.  A weakened blood vessel (aneurysm).  Heart failure.  Kidney damage.  Eye damage.  Metabolic syndrome.  Memory and concentration problems. HOW TO MANAGE YOUR BLOOD PRESSURE Blood pressure can be managed effectively with lifestyle changes and medicines (if needed). Your caregiver will help you come up with a plan to bring your blood pressure within a normal range. Your plan should include the following: Education  Read all information provided by your caregivers about how to control blood pressure.  Educate yourself on the latest guidelines  and treatment recommendations. New research is always being done to further define the risks and treatments for high blood pressure. Lifestylechanges  Control your weight.  Avoid smoking.  Stay physically active.  Reduce the amount of salt in your diet.  Reduce stress.  Control any chronic conditions, such as high  cholesterol or diabetes.  Reduce your alcohol intake. Medicines  Several medicines (antihypertensive medicines) are available, if needed, to bring blood pressure within a normal range. Communication  Review all the medicines you take with your caregiver because there may be side effects or interactions.  Talk with your caregiver about your diet, exercise habits, and other lifestyle factors that may be contributing to high blood pressure.  See your caregiver regularly. Your caregiver can help you create and adjust your plan for managing high blood pressure. RECOMMENDATIONS FOR TREATMENT AND FOLLOW-UP  The following recommendations are based on current guidelines for managing high blood pressure in nonpregnant adults. Use these recommendations to identify the proper follow-up period or treatment option based on your blood pressure reading. You can discuss these options with your caregiver.  Systolic pressure of 120 to 139 or diastolic pressure of 80 to 89: Follow up with your caregiver as directed.  Systolic pressure of 140 to 160 or diastolic pressure of 90 to 100: Follow up with your caregiver within 2 months.  Systolic pressure above 160 or diastolic pressure above 100: Follow up with your caregiver within 1 month.  Systolic pressure above 180 or diastolic pressure above 110: Consider antihypertensive therapy; follow up with your caregiver within 1 week.  Systolic pressure above 200 or diastolic pressure above 120: Begin antihypertensive therapy; follow up with your caregiver within 1 week.   This information is not intended to replace advice given to you by your health care provider. Make sure you discuss any questions you have with your health care provider.   Document Released: 05/13/2012 Document Reviewed: 05/13/2012 Elsevier Interactive Patient Education [employer]

## 2016-05-13 NOTE — Progress Notes (Signed)
Pre visit review using our clinic review tool, if applicable. No additional management support is needed unless otherwise documented below in the visit note. 

## 2016-05-13 NOTE — Assessment & Plan Note (Addendum)
Uncontrolled. Patient has no symptoms of hypertensive urgency or emergency. Patient and I jointly decided to start medication today. Pending BMP. Patient sees PCP in one week for follow-up. Patient education provided regarding side effects from lisinopril. She will keep a BP log at home and notify us if blood pressure is notably elevated or low or she is having any symptoms.

## 2016-05-15 ENCOUNTER — Ambulatory Visit: Payer: Medicare Other | Admitting: Internal Medicine

## 2016-05-20 ENCOUNTER — Ambulatory Visit (INDEPENDENT_AMBULATORY_CARE_PROVIDER_SITE_OTHER): Payer: Medicare Other | Admitting: Internal Medicine

## 2016-05-20 ENCOUNTER — Encounter: Payer: Self-pay | Admitting: Internal Medicine

## 2016-05-20 DIAGNOSIS — F5105 Insomnia due to other mental disorder: Secondary | ICD-10-CM | POA: Diagnosis not present

## 2016-05-20 DIAGNOSIS — F4321 Adjustment disorder with depressed mood: Secondary | ICD-10-CM

## 2016-05-20 DIAGNOSIS — F409 Phobic anxiety disorder, unspecified: Secondary | ICD-10-CM

## 2016-05-20 DIAGNOSIS — I10 Essential (primary) hypertension: Secondary | ICD-10-CM | POA: Diagnosis not present

## 2016-05-20 MED ORDER — ALPRAZOLAM 0.25 MG PO TABS: 0.2500 mg | ORAL_TABLET | Freq: Every day | ORAL | 2 refills | Status: DC | PRN

## 2016-05-20 NOTE — Patient Instructions (Addendum)
A normal  BP is 130 to 140 / 70 to 80   We generally start medication when the majority of readings are > 140/80    Trial of alprazolam instead of buspirone .  Start with 1 tablet.  May repeat in 30 minutes if still anxious

## 2016-05-20 NOTE — Progress Notes (Signed)
Pre visit review using our clinic review tool, if applicable. No additional management support is needed unless otherwise documented below in the visit note. 

## 2016-05-20 NOTE — Progress Notes (Signed)
Subjective:  Patient ID: Kimberly Rojas, female    DOB: 23-Feb-1946  Age: 70 y.o. MRN: [id number]  CC: Diagnoses of Essential (primary) hypertension, Grief, and Insomnia due to anxiety and fear were pertinent to this visit.  HPI Kimberly Rojas presents for follow up on chronic conditions including elevated BP.  She was treated for a wasp sting several weeks ago and referred to us for elevated blood pressure.  She was prescribed lisinopril by Sena HitchMargaret Arnette but has not started the medication.  Home readings have been normal without use of  lisinopril   Grief:  Weight is finally stable,  Going to  counselling .  uses buspirone prn ,  Feels nervous when it wears off (the 7.5 mg lasts for about 6 hours at the most) worse at night when she has to go to bed alone .   She denies dyspnea and orthopnea and is tolerating her medications .        Outpatient Medications Prior to Visit  Medication Sig Dispense Refill  . cholecalciferol (VITAMIN D) 1000 units tablet Take 1,000 Units by mouth daily.    . Multiple Vitamins-Minerals (MULTIVITAMIN PO) Take by mouth daily.    Marland Kitchen. omeprazole (PRILOSEC) 20 MG capsule Take by mouth.    Marland Kitchen. lisinopril (PRINIVIL,ZESTRIL) 10 MG tablet Take 1 tablet (10 mg total) by mouth daily. (Patient not taking: Reported on 05/20/2016) 90 tablet 3   No facility-administered medications prior to visit.     Review of Systems;  Patient denies headache, fevers, malaise, unintentional weight loss, skin rash, eye pain, sinus congestion and sinus pain, sore throat, dysphagia,  hemoptysis , cough, dyspnea, wheezing, chest pain, palpitations, orthopnea, edema, abdominal pain, nausea, melena, diarrhea, constipation, flank pain, dysuria, hematuria, urinary  Frequency, nocturia, numbness, tingling, seizures,  Focal weakness, Loss of consciousness,  Tremor, insomnia, depression, anxiety, and suicidal ideation.      Objective:  BP 130/62   Pulse 67   Temp 98 F (36.7 C) (Oral)   Ht  5\' 1"  (1.549 m)   Wt 135 lb 2 oz (61.3 kg)   SpO2 99%   BMI 25.53 kg/m   BP Readings from Last 3 Encounters:  05/20/16 130/62  05/13/16 (!) 156/76  02/21/16 (!) 144/72    Wt Readings from Last 3 Encounters:  05/20/16 135 lb 2 oz (61.3 kg)  05/13/16 135 lb 3.2 oz (61.3 kg)  02/21/16 135 lb 4 oz (61.3 kg)    General appearance: alert, cooperative and appears stated age Ears: normal TM's and external ear canals both ears Throat: lips, mucosa, and tongue normal; teeth and gums normal Neck: no adenopathy, no carotid bruit, supple, symmetrical, trachea midline and thyroid not enlarged, symmetric, no tenderness/mass/nodules Back: symmetric, no curvature. ROM normal. No CVA tenderness. Lungs: clear to auscultation bilaterally Heart: regular rate and rhythm, S1, S2 normal, no murmur, click, rub or gallop Abdomen: soft, non-tender; bowel sounds normal; no masses,  no organomegaly Pulses: 2+ and symmetric Skin: Skin color, texture, turgor normal. No rashes or lesions Lymph nodes: Cervical, supraclavicular, and axillary nodes normal.  Lab Results  Component Value Date   HGBA1C 5.7 10/24/2015    Lab Results  Component Value Date   CREATININE 0.74 05/13/2016   CREATININE 0.9 10/17/2015   CREATININE 0.82 03/26/2013    Lab Results  Component Value Date   WBC 6.9 05/24/2015   HGB 12.6 05/24/2015   HCT 37.9 03/26/2013   PLT 222 05/24/2015   GLUCOSE 104 (H) 05/13/2016  CHOL 184 10/24/2015   TRIG 86 10/24/2015   HDL 56 10/24/2015   LDLCALC 111 10/24/2015   ALT 14 05/13/2016   AST 19 05/13/2016   NA 138 05/13/2016   K 4.0 05/13/2016   CL 104 05/13/2016   CREATININE 0.74 05/13/2016   BUN 17 05/13/2016   CO2 28 05/13/2016   TSH 3.40 10/24/2015   INR 1.03 03/26/2013   HGBA1C 5.7 10/24/2015    Mm Screening Breast Tomo Bilateral  Result Date: 05/25/2015 CLINICAL DATA:  Screening. EXAM: DIGITAL SCREENING BILATERAL MAMMOGRAM WITH 3D TOMO WITH CAD COMPARISON:  Previous  exam(s). ACR Breast Density Category b: There are scattered areas of fibroglandular density. FINDINGS: There are no findings suspicious for malignancy. Images were processed with CAD. IMPRESSION: No mammographic evidence of malignancy. A result letter of this screening mammogram will be mailed directly to the patient. RECOMMENDATION: Screening mammogram in one year. (Code:SM-B-01Y) BI-RADS CATEGORY  1: Negative. Electronically Signed   By: Ted Mcalpine M.D.   On: 05/25/2015 08:40    Assessment & Plan:   Problem List Items Addressed This Visit    Essential (primary) hypertension    Well controlled without medications,  Medication stopped . Renal function stable, no changes today.  Lab Results  Component Value Date   CREATININE 0.74 05/13/2016   Lab Results  Component Value Date   NA 138 05/13/2016   K 4.0 05/13/2016   CL 104 05/13/2016   CO2 28 05/13/2016        Grief    Patient is dealing with the unexpected loss of spouse and has adequate coping skills and emotional support .  i have asked patinet to return in one month to examine for signs of unresolving grief.       Insomnia due to anxiety and fear    Not sleeping well with buspirone.  Trial of alprazolam        Other Visit Diagnoses   None.   A total of 25 minutes of face to face time was spent with patient more than half of which was spent in counselling about the above mentioned conditions  and coordination of care   I am having Kimberly Rojas start on ALPRAZolam. I am also having her maintain her Multiple Vitamins-Minerals (MULTIVITAMIN PO), omeprazole, cholecalciferol, and lisinopril.  Meds ordered this encounter  Medications  . ALPRAZolam (XANAX) 0.25 MG tablet    Sig: Take 1 tablet (0.25 mg total) by mouth daily as needed for anxiety.    Dispense:  30 tablet    Refill:  2    There are no discontinued medications.  Follow-up: Return in about 6 months (around 11/17/2016), or annual CPe.   Sherlene Shams, MD

## 2016-05-22 DIAGNOSIS — F5105 Insomnia due to other mental disorder: Secondary | ICD-10-CM

## 2016-05-22 DIAGNOSIS — F409 Phobic anxiety disorder, unspecified: Secondary | ICD-10-CM | POA: Insufficient documentation

## 2016-05-22 NOTE — Assessment & Plan Note (Signed)
Well controlled without medications,  Medication stopped . Renal function stable, no changes today.  Lab Results  Component Value Date   CREATININE 0.74 05/13/2016   Lab Results  Component Value Date   NA 138 05/13/2016   K 4.0 05/13/2016   CL 104 05/13/2016   CO2 28 05/13/2016

## 2016-05-22 NOTE — Assessment & Plan Note (Signed)
Patient is dealing with the unexpected loss of spouse and has adequate coping skills and emotional support .  i have asked patinet to return in one month to examine for signs of unresolving grief.   

## 2016-05-22 NOTE — Assessment & Plan Note (Signed)
Not sleeping well with buspirone.  Trial of alprazolam

## 2016-05-28 ENCOUNTER — Encounter: Payer: Self-pay | Admitting: Family

## 2016-05-28 ENCOUNTER — Ambulatory Visit (INDEPENDENT_AMBULATORY_CARE_PROVIDER_SITE_OTHER): Payer: Medicare Other | Admitting: Family

## 2016-05-28 VITALS — BP 118/72 | HR 65 | Temp 97.9°F | Wt 135.0 lb

## 2016-05-28 DIAGNOSIS — J209 Acute bronchitis, unspecified: Secondary | ICD-10-CM | POA: Diagnosis not present

## 2016-05-28 MED ORDER — AZITHROMYCIN 250 MG PO TABS: ORAL_TABLET | ORAL | 0 refills | Status: DC

## 2016-05-28 MED ORDER — PROMETHAZINE-DM 6.25-15 MG/5ML PO SYRP: 5.0000 mL | ORAL_SOLUTION | Freq: Every evening | ORAL | 1 refills | Status: DC | PRN

## 2016-05-28 NOTE — Patient Instructions (Signed)

## 2016-05-28 NOTE — Progress Notes (Signed)
Subjective:    Patient ID: Kimberly Rojas, female    DOB: 1946/05/16, 70 y.o.   MRN: [id number]  CC: Kimberly Rojas is a 70 y.o. female who presents today for an acute visit.    HPI: Patient here for acute visit for sinus congestion, which was improving and now worsening again. She was seen at Urgent care one week ago.  Has been on mucinex with some relief. Has dry cough and slightly 'out of breath' when walking around and thought it was from tussin OTC cough syrup. Tussin only temporarily helps. No wheeze, CP, fever.  Patient is not a smoker. She has no lung disease.      HISTORY:  Past Medical History:  Diagnosis Date  . Arthritis   . Chicken pox   . Heart murmur    hx of   . Heartburn   . Hyperlipidemia   . Migraines   . Peptic ulcer    Past Surgical History:  Procedure Laterality Date  . BREAST BIOPSY Left 2010   neg  . BREAST SURGERY     left breast biopsy   . CHOLECYSTECTOMY    . DILATION AND CURETTAGE OF UTERUS    . SHOULDER OPEN ROTATOR CUFF REPAIR Right 03/31/2013   Procedure: RIGHT ROTATOR CUFF REPAIR SHOULDER OPEN;  Surgeon: Jacki Cones, MD;  Location: [facility];  Service: Orthopedics;  Laterality: Right;  With Patch and Anchor  . TONSILLECTOMY AND ADENOIDECTOMY     Family History  Problem Relation Age of Onset  . Breast cancer Mother 17  . Arthritis Mother   . Diabetes Father   . Diabetes Paternal Grandfather     Allergies: Ibuprofen Current Outpatient Prescriptions on File Prior to Visit  Medication Sig Dispense Refill  . ALPRAZolam (XANAX) 0.25 MG tablet Take 1 tablet (0.25 mg total) by mouth daily as needed for anxiety. 30 tablet 2  . cholecalciferol (VITAMIN D) 1000 units tablet Take 1,000 Units by mouth daily.    Marland Kitchen lisinopril (PRINIVIL,ZESTRIL) 10 MG tablet Take 1 tablet (10 mg total) by mouth daily. (Patient not taking: Reported on 05/20/2016) 90 tablet 3  . Multiple Vitamins-Minerals (MULTIVITAMIN PO) Take by mouth daily.    Marland Kitchen omeprazole  (PRILOSEC) 20 MG capsule Take by mouth.     No current facility-administered medications on file prior to visit.     Social History  Substance Use Topics  . Smoking status: Never Smoker  . Smokeless tobacco: Never Used  . Alcohol use No    Review of Systems  Constitutional: Negative for chills and fever.  HENT: Positive for congestion. Negative for sinus pressure and sore throat.   Respiratory: Positive for cough and shortness of breath. Negative for wheezing.   Cardiovascular: Negative for chest pain and palpitations.  Gastrointestinal: Negative for nausea and vomiting.  Neurological: Positive for headaches (' a little HA' ).      Objective:    BP 118/72 (BP Location: Left Arm, Patient Position: Sitting, Cuff Size: Normal)   Pulse 65   Temp 97.9 F (36.6 C) (Oral)   Wt 135 lb (61.2 kg)   SpO2 98%   BMI 25.51 kg/m    Physical Exam  Constitutional: She appears well-developed and well-nourished.  HENT:  Head: Normocephalic and atraumatic.  Right Ear: Hearing, tympanic membrane, external ear and ear canal normal. No drainage, swelling or tenderness. No foreign bodies. Tympanic membrane is not erythematous and not bulging. No middle ear effusion. No decreased hearing is noted.  Left Ear: Hearing, tympanic membrane, external ear and ear canal normal. No drainage, swelling or tenderness. No foreign bodies. Tympanic membrane is not erythematous and not bulging.  No middle ear effusion. No decreased hearing is noted.  Nose: Nose normal. No rhinorrhea. Right sinus exhibits no maxillary sinus tenderness and no frontal sinus tenderness. Left sinus exhibits no maxillary sinus tenderness and no frontal sinus tenderness.  Mouth/Throat: Uvula is midline and mucous membranes are normal. Posterior oropharyngeal erythema present. No oropharyngeal exudate, posterior oropharyngeal edema or tonsillar abscesses.  Eyes: Conjunctivae are normal.  Cardiovascular: Regular rhythm, normal heart sounds  and normal pulses.   Pulmonary/Chest: Effort normal and breath sounds normal. She has no wheezes. She has no rhonchi. She has no rales.  Lymphadenopathy:       Head (right side): No submental, no submandibular, no tonsillar, no preauricular, no posterior auricular and no occipital adenopathy present.       Head (left side): No submental, no submandibular, no tonsillar, no preauricular, no posterior auricular and no occipital adenopathy present.    She has no cervical adenopathy.  Neurological: She is alert.  Skin: Skin is warm and dry.  Psychiatric: She has a normal mood and affect. Her speech is normal and behavior is normal. Thought content normal.  Vitals reviewed.      Assessment & Plan:   1. Acute bronchitis, unspecified organism Afebrile, no adventitious lung sounds. New complaint. Based on duration of symptoms, patient and I both agreed to reasonable for patient to start antibiotic. However we also jointly agreed that it be appropriate for her to wait another 1-2 days of symptom management to see if her cough improves prior to starting azithromycin. Patient agreed with this plan. She will take cough syrup at bedtime only. Return precautions given.   - azithromycin (ZITHROMAX) 250 MG tablet; Tale 500 mg PO on day 1, then 250 mg PO q24h x 4 days.  Dispense: 6 tablet; Refill: 0 - promethazine-dextromethorphan (PROMETHAZINE-DM) 6.25-15 MG/5ML syrup; Take 5 mLs by mouth at bedtime as needed for cough.  Dispense: 40 mL; Refill: 1    I am having Ms. Bocanegra maintain her Multiple Vitamins-Minerals (MULTIVITAMIN PO), omeprazole, cholecalciferol, lisinopril, ALPRAZolam, benzonatate, and fluticasone.   Meds ordered this encounter  Medications  . benzonatate (TESSALON) 100 MG capsule    Sig: Take by mouth.  . fluticasone (FLONASE) 50 MCG/ACT nasal spray    Sig: Place into the nose.    Return precautions given.   Risks, benefits, and alternatives of the medications and treatment plan  prescribed today were discussed, and patient expressed understanding.   Education regarding symptom management and diagnosis given to patient on AVS.  Continue to follow with TULLO, Mar DaringERESA L, MD for routine health maintenance.   Kimberly Loaarol A Armentor and I agreed with plan.   Rennie PlowmanMargaret Arnett, FNP

## 2016-06-04 ENCOUNTER — Ambulatory Visit
Admission: RE | Admit: 2016-06-04 | Discharge: 2016-06-04 | Disposition: A | Discharge status: Home / Self Care | Payer: Medicare Other | Source: Ambulatory Visit | Attending: Internal Medicine | Admitting: Internal Medicine

## 2016-06-04 ENCOUNTER — Other Ambulatory Visit: Payer: Self-pay | Admitting: Internal Medicine

## 2016-06-04 DIAGNOSIS — Z1239 Encounter for other screening for malignant neoplasm of breast: Secondary | ICD-10-CM

## 2016-06-04 DIAGNOSIS — Z1231 Encounter for screening mammogram for malignant neoplasm of breast: Secondary | ICD-10-CM

## 2016-09-19 ENCOUNTER — Ambulatory Visit: Payer: Medicare Other

## 2016-09-24 ENCOUNTER — Ambulatory Visit (INDEPENDENT_AMBULATORY_CARE_PROVIDER_SITE_OTHER): Payer: Medicare Other

## 2016-09-24 VITALS — BP 130/70 | HR 68 | Temp 98.0°F | Resp 12 | Ht 60.0 in | Wt 136.8 lb

## 2016-09-24 DIAGNOSIS — Z0000 Encounter for general adult medical examination without abnormal findings: Secondary | ICD-10-CM

## 2016-09-24 NOTE — Patient Instructions (Addendum)
  Kimberly Rojas , Thank you for taking time to come for your Medicare Wellness Visit. I appreciate your ongoing commitment to your health goals. Please review the following plan we discussed and let me know if I can assist you in the future.   These are the goals we discussed: Goals    . Healthy Lifestyle          Stay hydrated and drink plenty of fluids        This is a list of the screening recommended for you and due dates:  Health Maintenance  Topic Date Due  .  Hepatitis C: One time screening is recommended by Center for Disease Control  (CDC) for  adults born from [birth year] through 1967.   1946-06-25  . Shingles Vaccine  03/16/2004  . Pneumonia vaccines (1 of 2 - PCV13) 03/16/2009  . Flu Shot  04/02/2016  . Mammogram  06/04/2018  . Colon Cancer Screening  10/02/2019  . Tetanus Vaccine  11/01/2023  . DEXA scan (bone density measurement)  Completed

## 2016-09-24 NOTE — Progress Notes (Signed)
Subjective:   Kimberly Rojas is a 71 y.o. female who presents for an Initial Medicare Annual Wellness Visit.  Review of Systems    No ROS.  Medicare Wellness Visit.  Cardiac Risk Factors include: advanced age (>6655men, 47>65 women);hypertension     Objective:    Today's Vitals   09/24/16 1609  BP: 130/70  Pulse: 68  Resp: 12  Temp: 98 F (36.7 C)  TempSrc: Oral  SpO2: 98%  Weight: 136 lb 12.8 oz (62.1 kg)  Height: 5' (1.524 m)   Body mass index is 26.72 kg/m.   Current Medications (verified) Outpatient Encounter Prescriptions as of 09/24/2016  Medication Sig  . cholecalciferol (VITAMIN D) 1000 units tablet Take 1,000 Units by mouth daily.  Marland Kitchen. ALPRAZolam (XANAX) 0.25 MG tablet Take 1 tablet (0.25 mg total) by mouth daily as needed for anxiety. (Patient not taking: Reported on 09/24/2016)  . fluticasone (FLONASE) 50 MCG/ACT nasal spray Place into the nose.  Marland Kitchen. lisinopril (PRINIVIL,ZESTRIL) 10 MG tablet Take 1 tablet (10 mg total) by mouth daily. (Patient not taking: Reported on 05/20/2016)  . Multiple Vitamins-Minerals (MULTIVITAMIN PO) Take by mouth daily.  Marland Kitchen. omeprazole (PRILOSEC) 20 MG capsule Take by mouth.  . [DISCONTINUED] azithromycin (ZITHROMAX) 250 MG tablet Tale 500 mg PO on day 1, then 250 mg PO q24h x 4 days.  . [DISCONTINUED] promethazine-dextromethorphan (PROMETHAZINE-DM) 6.25-15 MG/5ML syrup Take 5 mLs by mouth at bedtime as needed for cough.   No facility-administered encounter medications on file as of 09/24/2016.     Allergies (verified) Tramadol and Ibuprofen   History: Past Medical History:  Diagnosis Date  . Arthritis   . Chicken pox   . Fatigue   . Hair loss   . Heart murmur    hx of   . Heartburn   . Hyperlipidemia   . Migraines   . Peptic ulcer    Past Surgical History:  Procedure Laterality Date  . BREAST BIOPSY Left 2010   neg  . BREAST SURGERY     left breast biopsy   . CHOLECYSTECTOMY    . DILATION AND CURETTAGE OF UTERUS    .  SHOULDER OPEN ROTATOR CUFF REPAIR Right 03/31/2013   Procedure: RIGHT ROTATOR CUFF REPAIR SHOULDER OPEN;  Surgeon: Jacki Conesonald A Gioffre, MD;  Location: [facility];  Service: Orthopedics;  Laterality: Right;  With Patch and Anchor  . TONSILLECTOMY AND ADENOIDECTOMY     Family History  Problem Relation Age of Onset  . Breast cancer Mother [id number]  . Arthritis Mother   . Chronic fatigue Mother   . Diabetes Father   . Diabetes Paternal Grandfather    Social History   Occupational History  . Not on file.   Social History Main Topics  . Smoking status: Never Smoker  . Smokeless tobacco: Never Used  . Alcohol use 0.6 oz/week    1 Glasses of wine per week  . Drug use: No  . Sexual activity: Not Currently    Tobacco Counseling Counseling given: Not Answered   Activities of Daily Living In your present state of health, do you have any difficulty performing the following activities: 09/24/2016  Hearing? Y  Vision? N  Difficulty concentrating or making decisions? N  Walking or climbing stairs? Y  Dressing or bathing? N  Doing errands, shopping? N  Preparing Food and eating ? N  Using the Toilet? N  In the past six months, have you accidently leaked urine? N  Do you have problems with  loss of bowel control? N  Managing your Medications? N  Managing your Finances? N  Housekeeping or managing your Housekeeping? N  Some recent data might be hidden    Immunizations and Health Maintenance Immunization History  Administered Date(s) Administered  . Influenza-Unspecified 07/18/2015  . Tdap 10/31/2013   Health Maintenance Due  Topic Date Due  . Hepatitis C Screening  08/08/1946  . ZOSTAVAX  03/16/2004  . PNA vac Low Risk Adult (1 of 2 - PCV13) 03/16/2009    Patient Care Team: Sherlene Shams, MD as PCP - General (Internal Medicine)  Indicate any recent Medical Services you may have received from other than [facility] providers in the past year (date may be approximate).     Assessment:   This  is a routine wellness examination for Kimberly Rojas.  The goal of the wellness visit is to assist the patient how to close the gaps in care and create a preventative care plan for the patient.   Taking calcium VIT D as appropriate/Osteoporosis risk reviewed.  Medications reviewed; taking without issues or barriers.  Safety issues reviewed; smoke detectors in the home. No firearms in the home. Wears seatbelts when driving or riding with others. No violence in the home.  No identified risk were noted; The patient was oriented x 3; appropriate in dress and manner and no objective failures at ADL's or IADL's.   BMI; discussed the importance of a healthy diet, water intake and exercise. Educational material provided.  HTN; followed by PCP.  Pneumonia and Zostavax vaccine postponed per patient request.  Educational material provided.  Patient Concerns: Fatigue, hair loss.  Appointment scheduled with PCP for follow up.  Hearing/Vision screen Hearing Screening Comments: Audiologic testing completed by [facility].  She does not wear hearing aids. Admits difficulty hearing when watching the television and conversation in crowds. Difficulty hearing a whisper.  Vision Screening Comments: Followed by Dr. Excell Seltzer [facility]). Wears corrective lenses. Visual acuity not assessed per patient preference since she has regular follow up with her ophthalmologist.  Dietary issues and exercise activities discussed: Current Exercise Habits: Structured exercise class (Silver Sneaker aerobic and swim class.  ), Type of exercise: calisthenics;stretching;strength training/weights, Time (Minutes): 60, Frequency (Times/Week): 2, Weekly Exercise (Minutes/Week): 120, Intensity: Intense  Goals    . Increase physical activity          Add 1 day to silver sneaker exercise regimen, equalling 3 days a week for 1 hour      Depression Screen PHQ 2/9 Scores 09/24/2016 11/01/2015  PHQ - 2 Score 0 0    Fall Risk Fall  Risk  09/24/2016 11/01/2015  Falls in the past year? No No    Cognitive Function:     6CIT Screen 09/24/2016  What Year? 0 points  What month? 0 points  What time? 0 points  Count back from 20 0 points  Months in reverse 0 points  Repeat phrase 0 points  Total Score 0    Screening Tests Health Maintenance  Topic Date Due  . Hepatitis C Screening  [birth year]  . ZOSTAVAX  03/16/2004  . PNA vac Low Risk Adult (1 of 2 - PCV13) 03/16/2009  . INFLUENZA VACCINE  11/30/2016 (Originally 04/02/2016)  . MAMMOGRAM  06/04/2018  . COLONOSCOPY  10/02/2019  . TETANUS/TDAP  11/01/2023  . DEXA SCAN  Completed      Plan:    End of life planning; Advance aging; Advanced directives discussed. No HCPOA/Living Will.  Additional educational information provided.  Copy  requested of HCPOA/Living Will short forms upon completion.  Time spent on this topic is 18 minutes.  Medicare Attestation I have personally reviewed: The patient's medical and social history Their use of alcohol, tobacco or illicit drugs Their current medications and supplements The patient's functional ability including ADLs,fall risks, home safety risks, cognitive, and hearing and visual impairment Diet and physical activities Evidence for depression   The patient's weight, height, BMI, and visual acuity have been recorded in the chart.  I have made referrals and provided education to the patient based on review of the above and I have provided the patient with a written personalized care plan for preventive services.    During the course of the visit, Kelda was educated and counseled about the following appropriate screening and preventive services:   Vaccines to include Pneumoccal, Influenza, Hepatitis B, Td, Zostavax, HCV  Electrocardiogram  Cardiovascular disease screening  Colorectal cancer screening  Bone density screening  Diabetes screening  Glaucoma screening  Mammography/PAP  Nutrition  counseling  Smoking cessation counseling  Patient Instructions (the written plan) were given to the patient.    Ashok Pall, LPN   10/02/8655

## 2016-09-29 NOTE — Progress Notes (Signed)
  I have reviewed the above information and agree with above.   Anthoney Sheppard, MD 

## 2016-11-18 ENCOUNTER — Ambulatory Visit (INDEPENDENT_AMBULATORY_CARE_PROVIDER_SITE_OTHER): Payer: Medicare Other | Admitting: Internal Medicine

## 2016-11-18 ENCOUNTER — Encounter: Payer: Self-pay | Admitting: Internal Medicine

## 2016-11-18 VITALS — BP 132/76 | HR 64 | Resp 16 | Ht 60.0 in | Wt 137.0 lb

## 2016-11-18 DIAGNOSIS — E039 Hypothyroidism, unspecified: Secondary | ICD-10-CM

## 2016-11-18 DIAGNOSIS — I5189 Other ill-defined heart diseases: Secondary | ICD-10-CM

## 2016-11-18 DIAGNOSIS — I519 Heart disease, unspecified: Secondary | ICD-10-CM

## 2016-11-18 DIAGNOSIS — E7800 Pure hypercholesterolemia, unspecified: Secondary | ICD-10-CM

## 2016-11-18 DIAGNOSIS — I10 Essential (primary) hypertension: Secondary | ICD-10-CM | POA: Diagnosis not present

## 2016-11-18 DIAGNOSIS — M5116 Intervertebral disc disorders with radiculopathy, lumbar region: Secondary | ICD-10-CM | POA: Diagnosis not present

## 2016-11-18 DIAGNOSIS — R5383 Other fatigue: Secondary | ICD-10-CM

## 2016-11-18 DIAGNOSIS — E2839 Other primary ovarian failure: Secondary | ICD-10-CM

## 2016-11-18 DIAGNOSIS — E559 Vitamin D deficiency, unspecified: Secondary | ICD-10-CM

## 2016-11-18 DIAGNOSIS — F4321 Adjustment disorder with depressed mood: Secondary | ICD-10-CM

## 2016-11-18 DIAGNOSIS — F4320 Adjustment disorder, unspecified: Secondary | ICD-10-CM

## 2016-11-18 MED ORDER — ALPRAZOLAM 0.25 MG PO TABS: 0.2500 mg | ORAL_TABLET | Freq: Every day | ORAL | 5 refills | Status: DC | PRN

## 2016-11-18 NOTE — Progress Notes (Signed)
Subjective:  Patient ID: Kimberly Rojas, female    DOB: 19-May-1946  Age: 71 y.o. MRN: [id number]  CC: The primary encounter diagnosis was Estrogen deficiency. Diagnoses of Neuritis or radiculitis due to rupture of lumbar intervertebral disc, Acquired hypothyroidism, Chronic systolic dysfunction of right ventricle, Pure hypercholesterolemia, and Grief were also pertinent to this visit.  HPI Kimberly Rojas presents for FOLLOW UP on prolonged grief reaction, and hypertension.    [place] 2 [place]KS [place].  FAMILY is doing well.  accompanied by her niece,  Still USING ALPRAZOLAM TO MAINTAIN SLEEP OTHERWISE WAKES UP AFTER A FEW HOURS.    Has periodic PAIN OF OA RIGHT HIP BOTHERED BY CLIMBING STAIRS AND SITTING TOO LONG.  MANAGED WITH ONE TYLENOL DAILY   SEEING [facility] FOR OA involving lumbar SPINE  LAST ESI HAS PROVIDED PRoLONGED RELIEF (2 YEARS AGO)   Health maintenance :  HAD COLONOSCOPY 5 YEARS AGO,  NORMAL (done by [employer] [facility])   mammogram up to date  Taking home readings of bp 117-120/60 without the lisinopril  [facility]  And walkign the other days   Drinking more  Water and decaf tea      Outpatient Medications Prior to Visit  Medication Sig Dispense Refill  . cholecalciferol (VITAMIN D) 1000 units tablet Take 1,000 Units by mouth daily.    . fluticasone (FLONASE) 50 MCG/ACT nasal spray Place into the nose.    . Multiple Vitamins-Minerals (MULTIVITAMIN PO) Take by mouth daily.    Marland Kitchen. ALPRAZolam (XANAX) 0.25 MG tablet Take 1 tablet (0.25 mg total) by mouth daily as needed for anxiety. 30 tablet 2  . omeprazole (PRILOSEC) 20 MG capsule Take by mouth.    Marland Kitchen. lisinopril (PRINIVIL,ZESTRIL) 10 MG tablet Take 1 tablet (10 mg total) by mouth daily. (Patient not taking: Reported on 05/20/2016) 90 tablet 3   No facility-administered medications prior to visit.     Review of Systems;  Patient denies headache, fevers, malaise, unintentional  weight loss, skin rash, eye pain, sinus congestion and sinus pain, sore throat, dysphagia,  hemoptysis , cough, dyspnea, wheezing, chest pain, palpitations, orthopnea, edema, abdominal pain, nausea, melena, diarrhea, constipation, flank pain, dysuria, hematuria, urinary  Frequency, nocturia, numbness, tingling, seizures,  Focal weakness, Loss of consciousness,  Tremor, insomnia, depression, anxiety, and suicidal ideation.      Objective:  BP 132/76 (BP Location: Left Arm, Patient Position: Sitting, Cuff Size: Normal)   Pulse 64   Resp 16   Ht 5' (1.524 m)   Wt 137 lb (62.1 kg)   SpO2 100%   BMI 26.76 kg/m   BP Readings from Last 3 Encounters:  11/18/16 132/76  09/24/16 130/70  05/28/16 118/72    Wt Readings from Last 3 Encounters:  11/18/16 137 lb (62.1 kg)  09/24/16 136 lb 12.8 oz (62.1 kg)  05/28/16 135 lb (61.2 kg)    General appearance: alert, cooperative and appears stated age Ears: normal TM's and external ear canals both ears Throat: lips, mucosa, and tongue normal; teeth and gums normal Neck: no adenopathy, no carotid bruit, supple, symmetrical, trachea midline and thyroid not enlarged, symmetric, no tenderness/mass/nodules Back: symmetric, no curvature. ROM normal. No CVA tenderness. Lungs: clear to auscultation bilaterally Heart: regular rate and rhythm, S1, S2 normal, no murmur, click, rub or gallop Abdomen: soft, non-tender; bowel sounds normal; no masses,  no organomegaly Pulses: 2+ and symmetric Skin: Skin color, texture, turgor normal. No rashes or  lesions Lymph nodes: Cervical, supraclavicular, and axillary nodes normal.  Lab Results  Component Value Date   HGBA1C 5.7 10/24/2015    Lab Results  Component Value Date   CREATININE 0.74 05/13/2016   CREATININE 0.9 10/17/2015   CREATININE 0.82 03/26/2013    Lab Results  Component Value Date   WBC 6.9 05/24/2015   HGB 12.6 05/24/2015   HCT 37.9 03/26/2013   PLT 222 05/24/2015   GLUCOSE 104 (H)  05/13/2016   CHOL 184 10/24/2015   TRIG 86 10/24/2015   HDL 56 10/24/2015   LDLCALC 111 10/24/2015   ALT 14 05/13/2016   AST 19 05/13/2016   NA 138 05/13/2016   K 4.0 05/13/2016   CL 104 05/13/2016   CREATININE 0.74 05/13/2016   BUN 17 05/13/2016   CO2 28 05/13/2016   TSH 3.40 10/24/2015   INR 1.03 03/26/2013   HGBA1C 5.7 10/24/2015    Mm Screening Breast Tomo Bilateral  Result Date: 06/05/2016 CLINICAL DATA:  Screening. EXAM: 2D DIGITAL SCREENING BILATERAL MAMMOGRAM WITH CAD AND ADJUNCT TOMO COMPARISON:  Previous exam(s). ACR Breast Density Category b: There are scattered areas of fibroglandular density. FINDINGS: There are no findings suspicious for malignancy. Images were processed with CAD. IMPRESSION: No mammographic evidence of malignancy. A result letter of this screening mammogram will be mailed directly to the patient. RECOMMENDATION: Screening mammogram in one year. (Code:[id number]-B-01Y) BI-RADS CATEGORY  1: Negative. Electronically Signed   By: Baird Lyons M.D.   On: 06/05/2016 08:34    Assessment & Plan:   Problem List Items Addressed This Visit    Acquired hypothyroidism    Thyroid function HAS NOT BEEN CHECKED IN A YEAR .  RETURN FOR LABS INCLUDING FASTING LIPIDS Lab Results  Component Value Date   TSH 3.40 10/24/2015         Chronic systolic dysfunction of right ventricle    MODERATELY REDUCED  RVFUNCTION WITH ENLARGEMENT,  BY OCT 2016 ECHO ([facility])      Grief    Patient is recovering well from the unexpected loss of spouse and has adequate coping skills and emotional support .       Neuritis or radiculitis due to rupture of lumbar intervertebral disc    symptoMs resolved after last esi by chesnis OVER 1 YEAR AGO       Relevant Medications   ALPRAZolam (XANAX) 0.25 MG tablet   Pure hypercholesterolemia    Untreated,  Due for repeat evaluation .       Other Visit Diagnoses    Estrogen deficiency    -  Primary   Relevant Orders   DG Bone Density       I have discontinued [name]'s lisinopril. I am also having her maintain her Multiple Vitamins-Minerals (MULTIVITAMIN PO), omeprazole, cholecalciferol, fluticasone, and ALPRAZolam.  Meds ordered this encounter  Medications  . ALPRAZolam (XANAX) 0.25 MG tablet    Sig: Take 1 tablet (0.25 mg total) by mouth daily as needed for anxiety.    Dispense:  30 tablet    Refill:  5    Medications Discontinued During This Encounter  Medication Reason  . lisinopril (PRINIVIL,ZESTRIL) 10 MG tablet Patient has not taken in last 30 days  . ALPRAZolam (XANAX) 0.25 MG tablet Reorder    Follow-up: Return in about 6 months (around 05/21/2017) for RN visit for BP machine check .   Sherlene Shams, MD

## 2016-11-18 NOTE — Progress Notes (Signed)
Pre visit review using our clinic review tool, if applicable. No additional management support is needed unless otherwise documented below in the visit note. 

## 2016-11-18 NOTE — Patient Instructions (Addendum)
You are doing well!  I'm glad your trip to [place] was enjoyable   The new goals for optimal blood pressure management are 120/70.     Please bring your home BP machine with you for an RN visit so we can confirm that it is accurate.   I'll see you again in 6 months for your annual exam,  And I will set you up for your "wellness visit" with denisa

## 2016-11-19 NOTE — Assessment & Plan Note (Signed)
Untreated,  Due for repeat evaluation .

## 2016-11-19 NOTE — Assessment & Plan Note (Signed)
Patient is recovering well from the unexpected loss of spouse and has adequate coping skills and emotional support .

## 2016-11-19 NOTE — Assessment & Plan Note (Signed)
MODERATELY REDUCED  RVFUNCTION WITH ENLARGEMENT,  BY OCT 2016 ECHO ([facility])

## 2016-11-19 NOTE — Assessment & Plan Note (Signed)
Thyroid function HAS NOT BEEN CHECKED IN A YEAR .  RETURN FOR LABS INCLUDING FASTING LIPIDS Lab Results  Component Value Date   TSH 3.40 10/24/2015

## 2016-11-19 NOTE — Assessment & Plan Note (Signed)
symptoMs resolved after last esi by chesnis OVER 1 YEAR AGO

## 2016-11-27 ENCOUNTER — Encounter: Payer: Self-pay | Admitting: *Deleted

## 2016-11-27 ENCOUNTER — Ambulatory Visit (INDEPENDENT_AMBULATORY_CARE_PROVIDER_SITE_OTHER): Payer: Medicare Other | Admitting: *Deleted

## 2016-11-27 VITALS — BP 130/68 | HR 67 | Resp 12

## 2016-11-27 DIAGNOSIS — I10 Essential (primary) hypertension: Secondary | ICD-10-CM | POA: Diagnosis not present

## 2016-11-27 NOTE — Progress Notes (Signed)
Patient presented for BP check per note from Visit 11/18/16 where last BP was 132/76,. Patient BP attained to day in left arm 130/72 Pulse 74 patient home cuff 128/74 pulse 72, after resting 8 minutes patient BP attained in right arm 130/68 pulse 67 with  Resting 5 -8 minutes be tween each additional read. Patient last read with her cuff to right arm 127/62 .

## 2016-12-01 NOTE — Progress Notes (Signed)
  I have reviewed the above information and agree with above.   No changes are needed to medications  Duncan Dull, MD

## 2017-01-08 ENCOUNTER — Ambulatory Visit
Admission: RE | Admit: 2017-01-08 | Discharge: 2017-01-08 | Disposition: A | Discharge status: Home / Self Care | Payer: Medicare Other | Source: Ambulatory Visit | Attending: Internal Medicine | Admitting: Internal Medicine

## 2017-01-08 DIAGNOSIS — E2839 Other primary ovarian failure: Secondary | ICD-10-CM

## 2017-01-09 ENCOUNTER — Encounter: Payer: Self-pay | Admitting: Internal Medicine

## 2017-01-09 DIAGNOSIS — M8580 Other specified disorders of bone density and structure, unspecified site: Secondary | ICD-10-CM | POA: Insufficient documentation

## 2017-04-08 ENCOUNTER — Other Ambulatory Visit: Payer: Self-pay | Admitting: Internal Medicine

## 2017-05-21 ENCOUNTER — Ambulatory Visit (INDEPENDENT_AMBULATORY_CARE_PROVIDER_SITE_OTHER): Payer: Medicare Other | Admitting: Internal Medicine

## 2017-05-21 ENCOUNTER — Encounter: Payer: Self-pay | Admitting: Internal Medicine

## 2017-05-21 VITALS — BP 148/72 | HR 63 | Temp 98.3°F | Resp 15 | Ht 60.0 in | Wt 137.4 lb

## 2017-05-21 DIAGNOSIS — I10 Essential (primary) hypertension: Secondary | ICD-10-CM

## 2017-05-21 DIAGNOSIS — E038 Other specified hypothyroidism: Secondary | ICD-10-CM

## 2017-05-21 DIAGNOSIS — E559 Vitamin D deficiency, unspecified: Secondary | ICD-10-CM

## 2017-05-21 DIAGNOSIS — R5383 Other fatigue: Secondary | ICD-10-CM | POA: Diagnosis not present

## 2017-05-21 DIAGNOSIS — E039 Hypothyroidism, unspecified: Secondary | ICD-10-CM | POA: Diagnosis not present

## 2017-05-21 DIAGNOSIS — E7800 Pure hypercholesterolemia, unspecified: Secondary | ICD-10-CM

## 2017-05-21 DIAGNOSIS — M5116 Intervertebral disc disorders with radiculopathy, lumbar region: Secondary | ICD-10-CM

## 2017-05-21 DIAGNOSIS — Z1231 Encounter for screening mammogram for malignant neoplasm of breast: Secondary | ICD-10-CM

## 2017-05-21 DIAGNOSIS — M5136 Other intervertebral disc degeneration, lumbar region: Secondary | ICD-10-CM | POA: Diagnosis not present

## 2017-05-21 DIAGNOSIS — R030 Elevated blood-pressure reading, without diagnosis of hypertension: Secondary | ICD-10-CM

## 2017-05-21 DIAGNOSIS — Z1239 Encounter for other screening for malignant neoplasm of breast: Secondary | ICD-10-CM

## 2017-05-21 DIAGNOSIS — M51369 Other intervertebral disc degeneration, lumbar region without mention of lumbar back pain or lower extremity pain: Secondary | ICD-10-CM

## 2017-05-21 MED ORDER — ALPRAZOLAM 0.25 MG PO TABS: 0.2500 mg | ORAL_TABLET | Freq: Every day | ORAL | 0 refills | Status: DC | PRN

## 2017-05-21 NOTE — Patient Instructions (Addendum)
Kimberly Rojas is a very good consumer blood pressure monitor   Our goal for blood pressure is < 130/80  At rest consistently .  Above that,  we should discuss a trial of medication   You can use aleve once or twice daily and tylenol   500 mg 4 times daily  (max dose 2000 mg) for back pain

## 2017-05-21 NOTE — Progress Notes (Signed)
Subjective:  Patient ID: Kimberly Rojas, female    DOB: 1946/04/13  Age: 71 y.o. MRN: [id number]  CC: The primary encounter diagnosis was Breast cancer screening. 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  HPI JAYLAA GALLION presents for follow up on GAD managed  with xanax She feels generally well,  Had a good summer visiting friends and family in [place].   Has been measuring her BP at home. Home blood pressures have been  130/80 regularly .   No symptoms  using xanax once a week  Prn insomnia    Lower back pain, non radiating . Receiving ESI  Last one did not work as well.   Outpatient Medications Prior to Visit  Medication Sig Dispense Refill  . cholecalciferol (VITAMIN D) 1000 units tablet Take 1,000 Units by mouth daily.    . fluticasone (FLONASE) 50 MCG/ACT nasal spray Place into the nose.    . Multiple Vitamins-Minerals (MULTIVITAMIN PO) Take by mouth daily.    Marland Kitchen ALPRAZolam (XANAX) 0.25 MG tablet TAKE 1 TABLET BY MOUTH DAILY AS NEEDED FOR ANXIETY 30 tablet 0  . omeprazole (PRILOSEC) 20 MG capsule Take by mouth.     No facility-administered medications prior to visit.     Review of Systems;  Patient denies headache, fevers, malaise, unintentional weight loss, skin rash, eye pain, sinus congestion and sinus pain, sore throat, dysphagia,  hemoptysis , cough, dyspnea, wheezing, chest pain, palpitations, orthopnea, edema, abdominal pain, nausea, melena, diarrhea, constipation, flank pain, dysuria, hematuria, urinary  Frequency, nocturia, numbness, tingling, seizures,  Focal weakness, Loss of consciousness,  Tremor, insomnia, depression,  anxiety, and suicidal ideation.      Objective:  BP (!) 148/72 (BP Location: Left Arm, Patient Position: Sitting, Cuff Size: Normal)   Pulse 63   Temp 98.3 F (36.8 C) (Oral)   Resp 15   Ht 5' (1.524 m)   Wt 137 lb 6.4 oz (62.3 kg)   SpO2 97%   BMI 26.83 kg/m   BP Readings from Last 3 Encounters:  05/21/17 (!) 148/72  11/27/16 130/68  11/18/16 132/76    Wt Readings from Last 3 Encounters:  05/21/17 137 lb 6.4 oz (62.3 kg)  11/18/16 137 lb (62.1 kg)  09/24/16 136 lb 12.8 oz (62.1 kg)    General appearance: alert, cooperative and appears stated age Ears: normal TM's and external ear canals both ears Throat: lips, mucosa, and tongue normal; teeth and gums normal Neck: no adenopathy, no carotid bruit, supple, symmetrical, trachea midline and thyroid not enlarged, symmetric, no tenderness/mass/nodules Back: symmetric, no curvature. ROM normal. No CVA tenderness. Lungs: clear to auscultation bilaterally Heart: regular rate and rhythm, S1, S2 normal, no murmur, click, rub or gallop Abdomen: soft, non-tender; bowel sounds normal; no masses,  no organomegaly Pulses: 2+ and symmetric Skin: Skin color, texture, turgor normal. No rashes or lesions Lymph nodes: Cervical, supraclavicular, and axillary nodes normal.  Lab Results  Component Value Date   HGBA1C 5.7 10/24/2015    Lab Results  Component Value Date   CREATININE 0.84 05/21/2017   CREATININE 0.74 05/13/2016   CREATININE 0.9 10/17/2015    Lab Results  Component Value Date   WBC 8.1 05/21/2017   HGB 12.8 05/21/2017   HCT 38.2 05/21/2017  PLT 220.0 05/21/2017   GLUCOSE 92 05/21/2017   CHOL 231 (H) 05/21/2017   TRIG 154.0 (H) 05/21/2017   HDL 68.20 05/21/2017   LDLCALC 132 (H) 05/21/2017   ALT 14 05/21/2017   AST 21 05/21/2017   NA 141 05/21/2017   K 3.9 05/21/2017   CL 103 05/21/2017   CREATININE 0.84 05/21/2017   BUN 22 05/21/2017   CO2 30 05/21/2017   TSH 6.91 (H) 05/21/2017   INR 1.03 03/26/2013    HGBA1C 5.7 10/24/2015    Dg Bone Density  Result Date: 01/08/2017 EXAM: DUAL X-RAY ABSORPTIOMETRY (DXA) FOR BONE MINERAL DENSITY IMPRESSION: Dear Dr. Darrick Huntsman, Your patient Kimberly Rojas completed a FRAX assessment on 01/08/2017 using the Lunar iDXA DXA System (analysis version: 14.10) manufactured by [employer]. The following summarizes the results of our evaluation. PATIENT BIOGRAPHICAL: Name: [name], [name] Patient ID: [id number] Birth Date: 1945-10-24 Height:    60.0 in. Gender:     Female    [age]       Weight:    137.2 lbs. Ethnicity:  White                            Exam Date: 01/08/2017 FRAX* RESULTS:  (version: 3.5) 10-year Probability of Fracture1 Major Osteoporotic Fracture2 Hip Fracture 10.2% 1.6% Population: [place] (Caucasian) Risk Factors: None Based on Femur (Left) Neck BMD 1 -The 10-year probability of fracture may be lower than reported if the patient has received treatment. 2 -Major Osteoporotic Fracture: Clinical Spine, Forearm, Hip or Shoulder *FRAX is a Armed forces logistics/support/administrative officer of the [employer] of [employer] for Metabolic Bone Disease, a World Science writer (WHO) [employer]. ASSESSMENT: The probability of a major osteoporotic fracture is 10.2% within the next ten years. The probability of a hip fracture is 1.6% within the next ten years. . Dear Dr. Darrick Huntsman, Your patient Mariela Rex completed a BMD test on 01/08/2017 using the Lunar iDXA DXA System (analysis version: 14.10) manufactured by [employer]. The following summarizes the results of our evaluation. PATIENT BIOGRAPHICAL: Name: [name], [name] Patient ID: [id number] Birth Date: 07-30-1946 Height: 60.0 in. Gender: Female Exam Date: 01/08/2017 Weight: 137.2 lbs. Indications: Advanced Age, Caucasian, Height Loss, Postmenopausal Fractures: Treatments: Multi-Vitamin with calcium, omeprazole, Vitamin D ASSESSMENT: The BMD measured at Femur Neck Left is 0.855 g/cm2 with a T-score of -1.3. This  patient is considered osteopenic according to World Health Organization [facility]) criteria. L-4 was excluded due to degenerative changes. Site Region Measured Measured WHO Young Adult BMD Date       Age      Classification T-score AP Spine L1-L3 01/08/2017 72.6 Normal 1.3 1.338 g/cm2 DualFemur Neck Left 01/08/2017 72.8 Osteopenia -1.3 0.855 g/cm2 World Health Organization [facility]) criteria for post-menopausal, Caucasian Women: Normal:       T-score at or above -1 SD Osteopenia:   T-score between -1 and -2.5 SD Osteoporosis: T-score at or below -2.5 SD RECOMMENDATIONS: National Osteoporosis Foundation recommends that FDA-approved medical therapies be considered in postmenopausal women and men age 104 or older with a: 1. Hip or vertebral (clinical or morphometric) fracture. 2. T-score of < -2.5 at the spine or hip. 3. Ten-year fracture probability by FRAX of 3% or greater for hip fracture or 20% or greater for major osteoporotic fracture. All treatment decisions require clinical judgment and consideration of individual patient factors, including patient preferences, co-morbidities, previous drug use, risk factors  not captured in the FRAX model (e.g. falls, vitamin D deficiency, increased bone turnover, interval significant decline in bone density) and possible under - or over-estimation of fracture risk by FRAX. All patients should ensure an adequate intake of dietary calcium (1200 mg/d) and vitamin D (800 IU daily) unless contraindicated. FOLLOW-UP: People with diagnosed cases of osteoporosis or at high risk for fracture should have regular bone mineral density tests. For patients eligible for Medicare, routine testing is allowed once every 2 years. The testing frequency can be increased to one year for patients who have rapidly progressing disease, those who are receiving or discontinuing medical therapy to restore bone mass, or have additional risk factors. I have reviewed this report, and agree with the above findings.  [facility] Electronically Signed   By: Norva Pavlov M.D.   On: 01/08/2017 15:18    Assessment & Plan:   Problem List Items Addressed This Visit    RESOLVED: Degeneration of intervertebral disc of lumbar region    Encouraged to use nsaid and pain reliever as needed       Neuritis or radiculitis due to rupture of lumbar intervertebral disc    Managed with serial  transforaminal  ESI by Chasnis , last one did not alleviate her pain ,       Relevant Medications   ALPRAZolam (XANAX) 0.25 MG tablet   Pure hypercholesterolemia    Using the Framingham risk calculator,  her 10 year risk of coronary artery disease is 15%. Will advise statin therapy trial.       Subclinical hypothyroidism     TSH is elevated.  Will review symptoms with patient, and start medication if indicated.   Lab Results  Component Value Date   TSH 6.91 (H) 05/21/2017         White coat syndrome without diagnosis of hypertension    .home readings have been checked are are normal .  no medications prescribed today   Lab Results  Component Value Date   CREATININE 0.84 05/21/2017   Lab Results  Component Value Date   NA 141 05/21/2017   K 3.9 05/21/2017   CL 103 05/21/2017   CO2 30 05/21/2017         Other Visit Diagnoses    Breast cancer screening    -  Primary   Relevant Orders   MM SCREENING BREAST TOMO BILATERAL   Essential hypertension       Vitamin D deficiency       Fatigue, unspecified type          I have changed Ms. Lazare's ALPRAZolam. I am also having her maintain her Multiple Vitamins-Minerals (MULTIVITAMIN PO), omeprazole, cholecalciferol, and fluticasone.  Meds ordered this encounter  Medications  . ALPRAZolam (XANAX) 0.25 MG tablet    Sig: Take 1 tablet (0.25 mg total) by mouth daily as needed. for anxiety    Dispense:  30 tablet    Refill:  0    Medications Discontinued During This Encounter  Medication Reason  . ALPRAZolam (XANAX) 0.25 MG tablet Reorder     Follow-up: No Follow-up on file.   Sherlene Shams, MD

## 2017-05-22 LAB — COMPREHENSIVE METABOLIC PANEL
ALBUMIN: 4 g/dL (ref 3.5–5.2)
ALT: 14 U/L (ref 0–35)
AST: 21 U/L (ref 0–37)
Alkaline Phosphatase: 79 U/L (ref 39–117)
BILIRUBIN TOTAL: 0.3 mg/dL (ref 0.2–1.2)
BUN: 22 mg/dL (ref 6–23)
CALCIUM: 9.5 mg/dL (ref 8.4–10.5)
CO2: 30 mEq/L (ref 19–32)
CREATININE: 0.84 mg/dL (ref 0.40–1.20)
Chloride: 103 mEq/L (ref 96–112)
GFR: 70.6 mL/min (ref >60.00)
Glucose, Bld: 92 mg/dL (ref 70–99)
Potassium: 3.9 mEq/L (ref 3.5–5.1)
Sodium: 141 mEq/L (ref 135–145)
Total Protein: 6.8 g/dL (ref 6.0–8.3)

## 2017-05-22 LAB — LIPID PANEL
CHOL/HDL RATIO: 3
Cholesterol: 231 mg/dL — ABNORMAL HIGH (ref 0–200)
HDL: 68.2 mg/dL (ref >39.00)
LDL CALC: 132 mg/dL — AB (ref 0–99)
NONHDL: 162.57
TRIGLYCERIDES: 154 mg/dL — AB (ref 0.0–149.0)
VLDL: 30.8 mg/dL (ref 0.0–40.0)

## 2017-05-22 LAB — CBC WITH DIFFERENTIAL/PLATELET
BASOS ABS: 0.1 10*3/uL (ref 0.0–0.1)
Basophils Relative: 1.1 % (ref 0.0–3.0)
EOS PCT: 2.8 % (ref 0.0–5.0)
Eosinophils Absolute: 0.2 10*3/uL (ref 0.0–0.7)
HCT: 38.2 % (ref 36.0–46.0)
Hemoglobin: 12.8 g/dL (ref 12.0–15.0)
LYMPHS ABS: 2 10*3/uL (ref 0.7–4.0)
Lymphocytes Relative: 24.2 % (ref 12.0–46.0)
MCHC: 33.5 g/dL (ref 30.0–36.0)
MCV: 90.1 fl (ref 78.0–100.0)
MONO ABS: 0.7 10*3/uL (ref 0.1–1.0)
Monocytes Relative: 8.9 % (ref 3.0–12.0)
NEUTROS ABS: 5.1 10*3/uL (ref 1.4–7.7)
Neutrophils Relative %: 63 % (ref 43.0–77.0)
PLATELETS: 220 10*3/uL (ref 150.0–400.0)
RBC: 4.24 Mil/uL (ref 3.87–5.11)
RDW: 13.4 % (ref 11.5–15.5)
WBC: 8.1 10*3/uL (ref 4.0–10.5)

## 2017-05-22 LAB — VITAMIN D 25 HYDROXY (VIT D DEFICIENCY, FRACTURES): VITD: 42.71 ng/mL (ref 30.00–100.00)

## 2017-05-22 LAB — TSH: TSH: 6.91 u[IU]/mL — AB (ref 0.35–4.50)

## 2017-05-24 NOTE — Assessment & Plan Note (Addendum)
Managed with serial  transforaminal  ESI by Chasnis , last one did not alleviate her pain ,

## 2017-05-24 NOTE — Assessment & Plan Note (Addendum)
TSH is elevated.  Will review symptoms with patient, and start medication if indicated.   Lab Results  Component Value Date   TSH 6.91 (H) 05/21/2017

## 2017-05-24 NOTE — Assessment & Plan Note (Signed)
.  home readings have been checked are are normal .  no medications prescribed today   Lab Results  Component Value Date   CREATININE 0.84 05/21/2017   Lab Results  Component Value Date   NA 141 05/21/2017   K 3.9 05/21/2017   CL 103 05/21/2017   CO2 30 05/21/2017

## 2017-05-24 NOTE — Assessment & Plan Note (Signed)
Using the Framingham risk calculator,  her 10 year risk of coronary artery disease is 15%. Will advise statin therapy trial.

## 2017-05-24 NOTE — Assessment & Plan Note (Signed)
Encouraged to use nsaid and pain reliever as needed

## 2017-05-29 ENCOUNTER — Ambulatory Visit (INDEPENDENT_AMBULATORY_CARE_PROVIDER_SITE_OTHER): Payer: Medicare Other

## 2017-05-29 VITALS — BP 140/70 | HR 70

## 2017-05-29 DIAGNOSIS — I10 Essential (primary) hypertension: Secondary | ICD-10-CM

## 2017-05-29 NOTE — Progress Notes (Signed)
Patient comes in today for a blood pressure check. Patient was last seen by Dr.Tullo 05/21/2017 and had a blood pressure of 148/72 and pulse of 63. Patient is not currently taking any medications for blood pressure.Patient blood pressure today is 140/70 on left arm with a pulse of 70. Patient has been checking her blood pressure at home and systolic has been averaging in the 110s and diastolic in the 60s and 70s. Patient brought her blood pressure machine in the office and reading on left arm was 152/85.

## 2017-06-17 ENCOUNTER — Ambulatory Visit
Admission: RE | Admit: 2017-06-17 | Discharge: 2017-06-17 | Disposition: A | Discharge status: Home / Self Care | Payer: PPO | Source: Ambulatory Visit | Attending: Internal Medicine | Admitting: Internal Medicine

## 2017-06-17 DIAGNOSIS — Z1231 Encounter for screening mammogram for malignant neoplasm of breast: Secondary | ICD-10-CM | POA: Insufficient documentation

## 2017-06-17 DIAGNOSIS — Z1239 Encounter for other screening for malignant neoplasm of breast: Secondary | ICD-10-CM

## 2017-09-12 ENCOUNTER — Telehealth: Payer: Self-pay

## 2017-09-12 ENCOUNTER — Other Ambulatory Visit: Payer: Self-pay

## 2017-09-12 ENCOUNTER — Emergency Department
Admission: EM | Admit: 2017-09-12 | Discharge: 2017-09-12 | Disposition: A | Discharge status: Home / Self Care | Payer: PPO | Attending: Emergency Medicine | Admitting: Emergency Medicine

## 2017-09-12 ENCOUNTER — Emergency Department: Payer: PPO

## 2017-09-12 DIAGNOSIS — R0789 Other chest pain: Secondary | ICD-10-CM | POA: Insufficient documentation

## 2017-09-12 DIAGNOSIS — Z79899 Other long term (current) drug therapy: Secondary | ICD-10-CM | POA: Insufficient documentation

## 2017-09-12 DIAGNOSIS — R079 Chest pain, unspecified: Secondary | ICD-10-CM | POA: Diagnosis not present

## 2017-09-12 LAB — CBC
HEMATOCRIT: 42.4 % (ref 35.0–47.0)
HEMOGLOBIN: 14.1 g/dL (ref 12.0–16.0)
MCH: 29.8 pg (ref 26.0–34.0)
MCHC: 33.4 g/dL (ref 32.0–36.0)
MCV: 89.3 fL (ref 80.0–100.0)
Platelets: 233 10*3/uL (ref 150–440)
RBC: 4.75 MIL/uL (ref 3.80–5.20)
RDW: 13.5 % (ref 11.5–14.5)
WBC: 6.9 10*3/uL (ref 3.6–11.0)

## 2017-09-12 LAB — BASIC METABOLIC PANEL
ANION GAP: 10 (ref 5–15)
BUN: 20 mg/dL (ref 6–20)
CO2: 27 mmol/L (ref 22–32)
Calcium: 9.1 mg/dL (ref 8.9–10.3)
Chloride: 102 mmol/L (ref 101–111)
Creatinine, Ser: 0.92 mg/dL (ref 0.44–1.00)
GFR calc Af Amer: >60 mL/min (ref >60)
GFR calc non Af Amer: >60 mL/min (ref >60)
GLUCOSE: 178 mg/dL — AB (ref 65–99)
POTASSIUM: 3.7 mmol/L (ref 3.5–5.1)
Sodium: 139 mmol/L (ref 135–145)

## 2017-09-12 LAB — HEPATIC FUNCTION PANEL
ALK PHOS: 80 U/L (ref 38–126)
ALT: 21 U/L (ref 14–54)
AST: 29 U/L (ref 15–41)
Albumin: 4 g/dL (ref 3.5–5.0)
BILIRUBIN TOTAL: 0.8 mg/dL (ref 0.3–1.2)
Total Protein: 7.5 g/dL (ref 6.5–8.1)

## 2017-09-12 LAB — TROPONIN I: Troponin I: <0.03 ng/mL (ref <0.03)

## 2017-09-12 NOTE — Telephone Encounter (Signed)
Agree with your assessment and advice

## 2017-09-12 NOTE — ED Provider Notes (Signed)
[facility] Emergency Department Provider Note   ____________________________________________   First MD Initiated Contact with Patient 09/12/17 1253     (approximate)  I have reviewed the triage vital signs and the nursing notes.   HISTORY  Chief Complaint Chest Pain   HPI Kimberly Rojas is a 72 y.o. female Who reports several weeks ago she was hanging drapes and then several hours later got some pain under the arms on both sides. That went away by itself. Last night after she took her reflux medicine she had pain for about an hour and a half low center of her chest. It would come and last for minutes to go away. He did not make her short of breath and nauseous or sweating. Nothing she did in fact made it better or worse. It went away and has not come back since. It is not associated with exertion. patient reports this is the anniversary of her husband's death or a year ago. She is waking up at night and she is becoming very anxious when she wakes up in the middle of the night at 4:00. Her physicians ([facility]. Last year she had a psychiatrist that she would talk to which she stopped seeing her because she felt better.   Past Medical History:  Diagnosis Date  . Arthritis   . Chicken pox   . Fatigue   . Hair loss   . Heart murmur    hx of   . Heartburn   . Hyperlipidemia   . Migraines   . Peptic ulcer     Patient Active Problem List   Diagnosis Date Noted  . White coat syndrome without diagnosis of hypertension 05/21/2017  . Osteopenia 01/09/2017  . 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 Chronic systolic dysfunction of right ventricle 11/05/2015  . Encounter to establish care 11/05/2015  . Grief 11/05/2015  . Subclinical hypothyroidism 11/23/2014  . Pure hypercholesterolemia 11/23/2014  . Neuritis or radiculitis due to rupture of lumbar intervertebral disc 04/20/2014  . Complete tear of rotator cuff 03/31/2013    Past Surgical  History:  Procedure Laterality Date  . BREAST BIOPSY Left 2010   neg  . BREAST SURGERY     left breast biopsy   . CHOLECYSTECTOMY    . DILATION AND CURETTAGE OF UTERUS    . SHOULDER OPEN ROTATOR CUFF REPAIR Right 03/31/2013   Procedure: RIGHT ROTATOR CUFF REPAIR SHOULDER OPEN;  Surgeon: Jacki Conesonald A Gioffre, MD;  Location: [facility] ORS;  Service: Orthopedics;  Laterality: Right;  With [employer]  . TONSILLECTOMY AND ADENOIDECTOMY      Prior to Admission medications   Medication Sig Start Date End Date Taking? Authorizing Provider  ALPRAZolam (XANAX) 0.25 MG tablet Take 1 tablet (0.25 mg total) by mouth daily as needed. for anxiety 05/21/17   Sherlene Shamsullo, Teresa L, MD  cholecalciferol (VITAMIN D) 1000 units tablet Take 1,000 Units by mouth daily.    [provider]  fluticasone (FLONASE) 50 MCG/ACT nasal spray Place into the nose. 05/21/16   [provider]  Multiple Vitamins-Minerals (MULTIVITAMIN PO) Take by mouth daily.    [provider]  omeprazole (PRILOSEC) 20 MG capsule Take by mouth. 10/30/15 10/29/16  [provider]    Allergies Tramadol and Ibuprofen  Family History  Problem Relation Age of Onset  . Breast cancer Mother [id number]  . Arthritis Mother   . Chronic fatigue Mother   . Diabetes Father   . Diabetes Paternal  Grandfather     Social History Social History   Tobacco Use  . Smoking status: Never Smoker  . Smokeless tobacco: Never Used  Substance Use Topics  . Alcohol use: Yes    Alcohol/week: 0.6 oz    Types: 1 Glasses of wine per week  . Drug use: No    Review of Systems  Constitutional: No fever/chills Eyes: No visual changes. ENT: No sore throat. Cardiovascular: see history of present illness Respiratory: Denies shortness of breath. Gastrointestinal: No abdominal pain.  No nausea, no vomiting.  No diarrhea.  No constipation. Genitourinary: Negative for dysuria. Musculoskeletal: Negative for back pain. Skin: Negative for  rash. Neurological: Negative for headaches, focal weakness   ____________________________________________   PHYSICAL EXAM:  VITAL SIGNS: ED Triage Vitals  Enc Vitals Group     BP 09/12/17 1051 (!) 156/80     Pulse Rate 09/12/17 1051 81     Resp 09/12/17 1051 18     Temp 09/12/17 1051 97.6 F (36.4 C)     Temp Source 09/12/17 1051 Oral     SpO2 09/12/17 1051 100 %     Weight 09/12/17 1052 134 lb (60.8 kg)     Height 09/12/17 1052 5\' 1"  (1.549 m)     Head Circumference --      Peak Flow --      Pain Score --      Pain Loc --      Pain Edu? --      Excl. in GC? --     Constitutional: Alert and oriented. Well appearing and in no acute distress. Eyes: Conjunctivae are normal.  Head: Atraumatic. Nose: No congestion/rhinnorhea. Mouth/Throat: Mucous membranes are moist.  Oropharynx non-erythematous. Neck: No stridor Cardiovascular: Normal rate, regular rhythm. Grossly normal heart sounds.  Good peripheral circulation. Respiratory: Normal respiratory effort.  No retractions. Lungs CTAB. Gastrointestinal: Soft and nontender. No distention. No abdominal bruits. No CVA tenderness. Musculoskeletal: No lower extremity tenderness nor edema.  No joint effusions. Neurologic:  Normal speech and language. No gross focal neurologic deficits are appreciated.  Skin:  Skin is warm, dry and intact. No rash noted. Psychiatric: Mood and affect are normal. Speech and behavior are normal.  ____________________________________________   LABS (all labs ordered are listed, but only abnormal results are displayed)  Labs Reviewed  BASIC METABOLIC PANEL - Abnormal; Notable for the following components:      Result Value   Glucose, Bld 178 (*)    All other components within normal limits  HEPATIC FUNCTION PANEL - Abnormal; Notable for the following components:   Bilirubin, Direct <0.1 (*)    All other components within normal limits  CBC  TROPONIN I    ____________________________________________  EKG EKG read and showed a by me shows normal sinus rhythm rate of 77 normal axis nonspecific ST-T changes  ____________________________________________  RADIOLOGY  Dg Chest 2 View  Result Date: 09/12/2017 CLINICAL DATA:  Chest pain EXAM: CHEST  2 VIEW COMPARISON:  None. FINDINGS: There is a tiny granuloma in the right mid lung. There is no edema or consolidation. The heart size and pulmonary vascularity are normal. No adenopathy. There is mild degenerative change in the thoracic spine. IMPRESSION: No edema or consolidation. Electronically Signed   By: Bretta Bang III M.D.   On: 09/12/2017 11:18   chest x-ray shows no acute disease. ____________________________________________   PROCEDURES  Procedure(s) performed:   Procedures  Critical Care performed:   ____________________________________________   INITIAL IMPRESSION / ASSESSMENT AND  PLAN / ED COURSE    Clinical Course as of Sep 12 1426  Fri Sep 12, 2017  1253 Hemoglobin: 14.1 [PM]    Clinical Course User Index [PM] Arnaldo Natal, MD     ____________________________________________   FINAL CLINICAL IMPRESSION(S) / ED DIAGNOSES  patient's lab work is normal. The pain does not sound cardiac in occurred last night troponins negative. Patient is having problems staying sleeping at night and wakes up in the middle of night at 4:00 is anxious. I will have her follow-up with her regular doctor and possibly the psychiatrist she was seen last year for her apparent depression. Just to be safe I'll also have her follow-up with the cardiologist.       Final diagnoses:  Nonspecific chest pain     ED Discharge Orders    None       Note:  This document was prepared using Dragon voice recognition software and may include unintentional dictation errors.    Arnaldo Natal, MD 09/12/17 [phone]

## 2017-09-12 NOTE — ED Triage Notes (Signed)
Pt states that she has been having pain at her breast bilat on the sides, pt states that she took a medication that starts with a p for her reflux, pt states that the pain returned in the center of her chest. Pt also reports that she has been anxious

## 2017-09-12 NOTE — Telephone Encounter (Signed)
[facility]. Advised the [facility] that the pt would need to go to either urgent care or ED for evaluation. [facility] stated that they could not give the pt that advise. PT was transferred to me. Spoke with pt and she stated that last week she was having bilateral breast pain that was located on the side of her breast. Then stated that last night she took a protonix and about an hour to two hours later she started experiences chest pain in the center of her chest. Asked the pt if she had any SOBr while having the chest and she stated no, that she thought the just had an anxiety attack because she got nervous not knowing what was going on. The pt stated that the chest pain was intermittent and lasted for about an hour. Asked the pt if she had experienced any more chest pain since last night and she stated that when she woke up this morning she had "slight" chest pain that went away shortly after getting out of bed. Pt was advised to go to ED or urgent care so she could be evaluated since we have no appt available in the office today. Pt stated that she would go to the ED.

## 2017-09-12 NOTE — Discharge Instructions (Signed)
please follow-up with your regular doctor as we discussed. The x-ray and blood work and EKG all looked okay. I have also given you the name of the cardiologist on-call. You can call them first thing Monday morning and get a follow-up appointment with them as well. I do not think your heart is what is causing the problem but it's always good to be safe. May also be beneficial for you to follow-up with the psychiatrist she had been seeing last year.

## 2017-09-17 DIAGNOSIS — M5416 Radiculopathy, lumbar region: Secondary | ICD-10-CM | POA: Diagnosis not present

## 2017-09-17 DIAGNOSIS — M5136 Other intervertebral disc degeneration, lumbar region: Secondary | ICD-10-CM | POA: Diagnosis not present

## 2017-09-18 ENCOUNTER — Encounter: Payer: Self-pay | Admitting: Internal Medicine

## 2017-09-18 ENCOUNTER — Ambulatory Visit (INDEPENDENT_AMBULATORY_CARE_PROVIDER_SITE_OTHER): Payer: PPO | Admitting: Internal Medicine

## 2017-09-18 VITALS — BP 138/76 | HR 76 | Temp 98.1°F | Resp 14 | Ht 61.0 in | Wt 138.4 lb

## 2017-09-18 DIAGNOSIS — E039 Hypothyroidism, unspecified: Secondary | ICD-10-CM | POA: Diagnosis not present

## 2017-09-18 DIAGNOSIS — F411 Generalized anxiety disorder: Secondary | ICD-10-CM | POA: Diagnosis not present

## 2017-09-18 DIAGNOSIS — R0789 Other chest pain: Secondary | ICD-10-CM | POA: Diagnosis not present

## 2017-09-18 DIAGNOSIS — R7989 Other specified abnormal findings of blood chemistry: Secondary | ICD-10-CM | POA: Diagnosis not present

## 2017-09-18 DIAGNOSIS — K2100 Gastro-esophageal reflux disease with esophagitis, without bleeding: Secondary | ICD-10-CM

## 2017-09-18 DIAGNOSIS — E038 Other specified hypothyroidism: Secondary | ICD-10-CM

## 2017-09-18 DIAGNOSIS — R030 Elevated blood-pressure reading, without diagnosis of hypertension: Secondary | ICD-10-CM

## 2017-09-18 DIAGNOSIS — E7800 Pure hypercholesterolemia, unspecified: Secondary | ICD-10-CM | POA: Diagnosis not present

## 2017-09-18 DIAGNOSIS — E782 Mixed hyperlipidemia: Secondary | ICD-10-CM | POA: Diagnosis not present

## 2017-09-18 LAB — TSH: TSH: 2.45 u[IU]/mL (ref 0.35–4.50)

## 2017-09-18 LAB — LDL CHOLESTEROL, DIRECT: LDL DIRECT: 141 mg/dL

## 2017-09-18 LAB — T4, FREE: Free T4: 0.75 ng/dL (ref 0.60–1.60)

## 2017-09-18 MED ORDER — BUSPIRONE HCL 7.5 MG PO TABS: 7.5000 mg | ORAL_TABLET | Freq: Three times a day (TID) | ORAL | 3 refills | Status: DC

## 2017-09-18 MED ORDER — OMEPRAZOLE 40 MG PO CPDR: 40.0000 mg | DELAYED_RELEASE_CAPSULE | Freq: Every day | ORAL | 0 refills | Status: DC

## 2017-09-18 NOTE — Patient Instructions (Addendum)
Continue the buspirone 2 to 3 times daily for anxiety   resume omeprazole once daily in the morning for reflux  The chest pain is likely due to referred pain from  Your back   You can use motrin 2 tablets as needed, along with tylenol  We are repeating your thyroid function today.  I will notify you of the results    Gastroesophageal Reflux Disease, Adult Normally, food travels down the esophagus and stays in the stomach to be digested. If a person has gastroesophageal reflux disease (GERD), food and stomach acid move back up into the esophagus. When this happens, the esophagus becomes sore and swollen (inflamed). Over time, GERD can make small holes (ulcers) in the lining of the esophagus. Follow these instructions at home: Diet  Follow a diet as told by your doctor. You may need to avoid foods and drinks such as: ? Coffee and tea (with or without caffeine). ? Drinks that contain alcohol. ? Energy drinks and sports drinks. ? Carbonated drinks or sodas. ? Chocolate and cocoa. ? Peppermint and mint flavorings. ? Garlic and onions. ? Horseradish. ? Spicy and acidic foods, such as peppers, chili powder, curry powder, vinegar, hot sauces, and BBQ sauce. ? Citrus fruit juices and citrus fruits, such as oranges, lemons, and limes. ? Tomato-based foods, such as red sauce, chili, salsa, and pizza with red sauce. ? Fried and fatty foods, such as donuts, french fries, potato chips, and high-fat dressings. ? High-fat meats, such as hot dogs, rib eye steak, sausage, ham, and bacon. ? High-fat dairy items, such as whole milk, butter, and cream cheese.  Eat small meals often. Avoid eating large meals.  Avoid drinking large amounts of liquid with your meals.  Avoid eating meals during the 2-3 hours before bedtime.  Avoid lying down right after you eat.  Do not exercise right after you eat. General instructions  Pay attention to any changes in your symptoms.  Take over-the-counter and  prescription medicines only as told by your doctor. Do not take aspirin, ibuprofen, or other NSAIDs unless your doctor says it is okay.  Do not use any tobacco products, including cigarettes, chewing tobacco, and e-cigarettes. If you need help quitting, ask your doctor.  Wear loose clothes. Do not wear anything tight around your waist.  Raise (elevate) the head of your bed about 6 inches (15 cm).  Try to lower your stress. If you need help doing this, ask your doctor.  If you are overweight, lose an amount of weight that is healthy for you. Ask your doctor about a safe weight loss goal.  Keep all follow-up visits as told by your doctor. This is important. Contact a doctor if:  You have new symptoms.  You lose weight and you do not know why it is happening.  You have trouble swallowing, or it hurts to swallow.  You have wheezing or a cough that keeps happening.  Your symptoms do not get better with treatment.  You have a hoarse voice. Get help right away if:  You have pain in your arms, neck, jaw, teeth, or back.  You feel sweaty, dizzy, or light-headed.  You have chest pain or shortness of breath.  You throw up (vomit) and your throw up looks like blood or coffee grounds.  You pass out (faint).  Your poop (stool) is bloody or black.  You cannot swallow, drink, or eat. This information is not intended to replace advice given to you by your health care provider.  Make sure you discuss any questions you have with your health care provider. Document Released: 02/05/2008 Document Revised: 01/25/2016 Document Reviewed: 12/14/2014 Elsevier Interactive Patient Education  [employer].

## 2017-09-18 NOTE — Progress Notes (Signed)
Subjective:  Patient ID: Kimberly Rojas, female    DOB: 09-07-1945  Age: 72 y.o. MRN: [id number]  CC: The primary encounter diagnosis was Mixed hyperlipidemia. Diagnoses of Abnormal TSH, White coat syndrome without diagnosis of hypertension, Atypical chest pain, Pure hypercholesterolemia, Subclinical hypothyroidism, GERD with esophagitis, and Generalized anxiety disorder were also pertinent to this visit.  HPI Kimberly Rojas presents for follow up on multiple issues including  Recent ER visit for chest pain and  Hyperlipidemia    last week had an episode bilateral chest pain that occurred at bedtime.  following morning had substernal chest pain .  Went to ER pain was gone by time of ER evaluation. EKG nondiagnositic,  AMI ruled out.   Has thoracic spine  DDD , with frequent back spasms. Referral was made to Dr Lady GaryFath and appt is tomorrow.   Was treated with predsione for 2weeks by [facility]hesnis which  Helped relieve the back spasms, the episode and hte prednisone use have aggravated her anxiety,  So she Started using the buspar 7.5 mg tid.    She received an ESI  On the right side yesterday by [facility].   Patient is taking her medications as prescribed and notes no adverse effects.  Home BP readings have been done about once per week and are  generally < 130/80 .  She is avoiding added salt in her diet and walking regularly about 3 times per week for exercise  .Home readings are 120/70 or less on a home calibrated instrument    Outpatient Medications Prior to Visit  Medication Sig Dispense Refill  . ALPRAZolam (XANAX) 0.25 MG tablet Take 1 tablet (0.25 mg total) by mouth daily as needed. for anxiety 30 tablet 0  . cholecalciferol (VITAMIN D) 1000 units tablet Take 1,000 Units by mouth daily.    . Multiple Vitamins-Minerals (MULTIVITAMIN PO) Take by mouth daily.    . fluticasone (FLONASE) 50 MCG/ACT nasal spray Place into the nose.    Marland Kitchen. omeprazole (PRILOSEC) 20 MG capsule Take by mouth.     No  facility-administered medications prior to visit.     Review of Systems;  Patient denies headache, fevers, malaise, unintentional weight loss, skin rash, eye pain, sinus congestion and sinus pain, sore throat, dysphagia,  hemoptysis , cough, dyspnea, wheezing, chest pain, palpitations, orthopnea, edema, abdominal pain, nausea, melena, diarrhea, constipation, flank pain, dysuria, hematuria, urinary  Frequency, nocturia, numbness, tingling, seizures,  Focal weakness, Loss of consciousness,  Tremor, insomnia, depression, anxiety, and suicidal ideation.      Objective:  BP 138/76 (BP Location: Left Arm, Patient Position: Sitting, Cuff Size: Normal)   Pulse 76   Temp 98.1 F (36.7 C) (Oral)   Resp 14   Ht 5\' 1"  (1.549 m)   Wt 138 lb 6.4 oz (62.8 kg)   SpO2 98%   BMI 26.15 kg/m   BP Readings from Last 3 Encounters:  09/18/17 138/76  09/12/17 (!) 157/73  05/29/17 140/70    Wt Readings from Last 3 Encounters:  09/18/17 138 lb 6.4 oz (62.8 kg)  09/12/17 134 lb (60.8 kg)  05/21/17 137 lb 6.4 oz (62.3 kg)    General appearance: alert, cooperative and appears stated age Ears: normal TM's and external ear canals both ears Throat: lips, mucosa, and tongue normal; teeth and gums normal Neck: no adenopathy, no carotid bruit, supple, symmetrical, trachea midline and thyroid not enlarged, symmetric, no tenderness/mass/nodules Back: symmetric, no curvature. ROM normal. No CVA tenderness. Lungs: clear to auscultation bilaterally  Heart: regular rate and rhythm, S1, S2 normal, no murmur, click, rub or gallop Abdomen: soft, non-tender; bowel sounds normal; no masses,  no organomegaly Pulses: 2+ and symmetric Skin: Skin color, texture, turgor normal. No rashes or lesions Lymph nodes: Cervical, supraclavicular, and axillary nodes normal.  Lab Results  Component Value Date   HGBA1C 5.7 10/24/2015    Lab Results  Component Value Date   CREATININE 0.92 09/12/2017   CREATININE 0.84 05/21/2017    CREATININE 0.74 05/13/2016    Lab Results  Component Value Date   WBC 6.9 09/12/2017   HGB 14.1 09/12/2017   HCT 42.4 09/12/2017   PLT 233 09/12/2017   GLUCOSE 178 (H) 09/12/2017   CHOL 231 (H) 05/21/2017   TRIG 154.0 (H) 05/21/2017   HDL 68.20 05/21/2017   LDLDIRECT 141.0 09/18/2017   LDLCALC 132 (H) 05/21/2017   ALT 21 09/12/2017   AST 29 09/12/2017   NA 139 09/12/2017   K 3.7 09/12/2017   CL 102 09/12/2017   CREATININE 0.92 09/12/2017   BUN 20 09/12/2017   CO2 27 09/12/2017   TSH 2.45 09/18/2017   INR 1.03 03/26/2013   HGBA1C 5.7 10/24/2015    Dg Chest 2 View  Result Date: 09/12/2017 CLINICAL DATA:  Chest pain EXAM: CHEST  2 VIEW COMPARISON:  None. FINDINGS: There is a tiny granuloma in the right mid lung. There is no edema or consolidation. The heart size and pulmonary vascularity are normal. No adenopathy. There is mild degenerative change in the thoracic spine. IMPRESSION: No edema or consolidation. Electronically Signed   By: Bretta Bang III M.D.   On: 09/12/2017 11:18    Assessment & Plan:   Problem List Items Addressed This Visit    Atypical chest pain    Negative ER evaluation for AMI. Symptoms attributed to GERD and DDD thoracic spine but referral to Dr Lady Gary has been done and a stress test has been ordered.         Generalized anxiety disorder    Continue buspirone bid/tid      Relevant Medications   busPIRone (BUSPAR) 7.5 MG tablet   GERD with esophagitis    Advised to resume omeprazole       Pure hypercholesterolemia    Using the Framingham risk calculator,  her 10 year risk of coronary artery disease  Was calculated  And elevated.   Statin therapy was advised in September but deferred  .  Repeat direct LDL today is 141 .  Will again recommend statin therapy.    Lab Results  Component Value Date   CHOL 231 (H) 05/21/2017   HDL 68.20 05/21/2017   LDLCALC 132 (H) 05/21/2017   LDLDIRECT 141.0 09/18/2017   TRIG 154.0 (H) 05/21/2017    CHOLHDL 3 05/21/2017         Subclinical hypothyroidism    TSH was repeated and now normal.  No medication indicated  Lab Results  Component Value Date   TSH 2.45 09/18/2017         White coat syndrome without diagnosis of hypertension    .home readings have been checked are are normal .  no medications prescribed today   Lab Results  Component Value Date   CREATININE 0.92 09/12/2017   Lab Results  Component Value Date   NA 139 09/12/2017   K 3.7 09/12/2017   CL 102 09/12/2017   CO2 27 09/12/2017         Other Visit Diagnoses    Mixed hyperlipidemia    -  Primary   Relevant Orders   LDL cholesterol, direct (Completed)   Abnormal TSH       Relevant Orders   TSH (Completed)   T4, free (Completed)     A total of 25 minutes was spent with patient more than half of which was spent in counseling patient on the above mentioned issues , reviewing and explaining recent labs and imaging studies done, and coordination of care.  I have changed Kimberly Rojas's omeprazole. I am also having her maintain her Multiple Vitamins-Minerals (MULTIVITAMIN PO), cholecalciferol, fluticasone, ALPRAZolam, and busPIRone.  Meds ordered this encounter  Medications  . busPIRone (BUSPAR) 7.5 MG tablet    Sig: Take 1 tablet (7.5 mg total) by mouth 3 (three) times daily.    Dispense:  90 tablet    Refill:  3  . omeprazole (PRILOSEC) 40 MG capsule    Sig: Take 1 capsule (40 mg total) by mouth daily.    Dispense:  30 capsule    Refill:  0    Medications Discontinued During This Encounter  Medication Reason  . omeprazole (PRILOSEC) 20 MG capsule Reorder    Follow-up: Return in about 3 months (around 12/17/2017).   Sherlene Shams, MD

## 2017-09-19 ENCOUNTER — Encounter: Payer: Self-pay | Admitting: Internal Medicine

## 2017-09-19 DIAGNOSIS — I10 Essential (primary) hypertension: Secondary | ICD-10-CM | POA: Diagnosis not present

## 2017-09-19 DIAGNOSIS — R0789 Other chest pain: Secondary | ICD-10-CM | POA: Diagnosis not present

## 2017-09-19 DIAGNOSIS — E7800 Pure hypercholesterolemia, unspecified: Secondary | ICD-10-CM | POA: Diagnosis not present

## 2017-09-20 ENCOUNTER — Encounter: Payer: Self-pay | Admitting: Internal Medicine

## 2017-09-20 DIAGNOSIS — K2100 Gastro-esophageal reflux disease with esophagitis, without bleeding: Secondary | ICD-10-CM | POA: Insufficient documentation

## 2017-09-20 DIAGNOSIS — R0789 Other chest pain: Secondary | ICD-10-CM | POA: Insufficient documentation

## 2017-09-20 DIAGNOSIS — F411 Generalized anxiety disorder: Secondary | ICD-10-CM | POA: Insufficient documentation

## 2017-09-20 NOTE — Assessment & Plan Note (Signed)
.  home readings have been checked are are normal .  no medications prescribed today   Lab Results  Component Value Date   CREATININE 0.92 09/12/2017   Lab Results  Component Value Date   NA 139 09/12/2017   K 3.7 09/12/2017   CL 102 09/12/2017   CO2 27 09/12/2017

## 2017-09-20 NOTE — Assessment & Plan Note (Signed)
TSH was repeated and now normal.  No medication indicated  Lab Results  Component Value Date   TSH 2.45 09/18/2017

## 2017-09-20 NOTE — Assessment & Plan Note (Signed)
Continue buspirone bid/tid

## 2017-09-20 NOTE — Assessment & Plan Note (Addendum)
Using the Framingham risk calculator,  her 10 year risk of coronary artery disease  Was calculated  And elevated.   Statin therapy was advised in September but deferred  .  Repeat direct LDL today is 141 .  Will again recommend statin therapy.    Lab Results  Component Value Date   CHOL 231 (H) 05/21/2017   HDL 68.20 05/21/2017   LDLCALC 132 (H) 05/21/2017   LDLDIRECT 141.0 09/18/2017   TRIG 154.0 (H) 05/21/2017   CHOLHDL 3 05/21/2017

## 2017-09-20 NOTE — Assessment & Plan Note (Signed)
Advised to resume omeprazole  °

## 2017-09-20 NOTE — Assessment & Plan Note (Addendum)
Negative ER evaluation for AMI. Symptoms attributed to GERD and DDD thoracic spine but referral to Dr Lady GaryFath has been done and a stress test has been ordered.

## 2017-09-22 ENCOUNTER — Other Ambulatory Visit: Payer: Self-pay | Admitting: Internal Medicine

## 2017-09-22 DIAGNOSIS — Z79899 Other long term (current) drug therapy: Secondary | ICD-10-CM

## 2017-09-22 MED ORDER — PRAVASTATIN SODIUM 20 MG PO TABS: 20.0000 mg | ORAL_TABLET | Freq: Every day | ORAL | 0 refills | Status: DC

## 2017-09-22 NOTE — Progress Notes (Signed)
MyChart message sent . cmet ordered and pravastatin sent

## 2017-09-24 ENCOUNTER — Ambulatory Visit: Payer: Medicare Other

## 2017-10-08 DIAGNOSIS — M5136 Other intervertebral disc degeneration, lumbar region: Secondary | ICD-10-CM | POA: Diagnosis not present

## 2017-10-08 DIAGNOSIS — M5416 Radiculopathy, lumbar region: Secondary | ICD-10-CM | POA: Diagnosis not present

## 2017-11-05 DIAGNOSIS — T1502XA Foreign body in cornea, left eye, initial encounter: Secondary | ICD-10-CM | POA: Diagnosis not present

## 2017-11-26 ENCOUNTER — Encounter: Payer: Self-pay | Admitting: Internal Medicine

## 2017-11-26 ENCOUNTER — Ambulatory Visit (INDEPENDENT_AMBULATORY_CARE_PROVIDER_SITE_OTHER): Payer: PPO | Admitting: Internal Medicine

## 2017-11-26 VITALS — BP 134/70 | HR 73 | Temp 98.1°F | Resp 14 | Ht 61.0 in | Wt 138.6 lb

## 2017-11-26 DIAGNOSIS — E039 Hypothyroidism, unspecified: Secondary | ICD-10-CM | POA: Diagnosis not present

## 2017-11-26 DIAGNOSIS — K2100 Gastro-esophageal reflux disease with esophagitis, without bleeding: Secondary | ICD-10-CM

## 2017-11-26 DIAGNOSIS — R5383 Other fatigue: Secondary | ICD-10-CM

## 2017-11-26 DIAGNOSIS — R030 Elevated blood-pressure reading, without diagnosis of hypertension: Secondary | ICD-10-CM

## 2017-11-26 DIAGNOSIS — Z23 Encounter for immunization: Secondary | ICD-10-CM | POA: Diagnosis not present

## 2017-11-26 DIAGNOSIS — K449 Diaphragmatic hernia without obstruction or gangrene: Secondary | ICD-10-CM

## 2017-11-26 DIAGNOSIS — F409 Phobic anxiety disorder, unspecified: Secondary | ICD-10-CM

## 2017-11-26 DIAGNOSIS — Z299 Encounter for prophylactic measures, unspecified: Secondary | ICD-10-CM

## 2017-11-26 DIAGNOSIS — M5116 Intervertebral disc disorders with radiculopathy, lumbar region: Secondary | ICD-10-CM

## 2017-11-26 DIAGNOSIS — E7800 Pure hypercholesterolemia, unspecified: Secondary | ICD-10-CM

## 2017-11-26 DIAGNOSIS — E038 Other specified hypothyroidism: Secondary | ICD-10-CM

## 2017-11-26 DIAGNOSIS — F5105 Insomnia due to other mental disorder: Secondary | ICD-10-CM | POA: Diagnosis not present

## 2017-11-26 MED ORDER — GABAPENTIN 100 MG PO CAPS: 100.0000 mg | ORAL_CAPSULE | Freq: Three times a day (TID) | ORAL | 3 refills | Status: DC

## 2017-11-26 NOTE — Progress Notes (Signed)
Patient ID: Kimberly Rojas, female    DOB: 03-14-1946  Age: 72 y.o. MRN: [id number]  The patient is here for annual preventive examination and management of other chronic and acute problems.    Health maintenance /preventive measures reviewed:  DEXA 2018 mammogram oct 2018 colonoscopy 2011  Due in 2021  Needs Hep C screening and Prevnar  vaccine   Wears a hearing aid in right ear.  Had hearing test   The risk factors are reflected in the social history.  The roster of all physicians providing medical care to patient - is listed in the Snapshot section of the chart.  Activities of daily living:  The patient is 100% independent in all ADLs: dressing, toileting, feeding as well as independent mobility  Home safety : The patient has smoke detectors in the home. They wear seatbelts.  There are no firearms at home. There is no violence in the home.   There is no risks for hepatitis, STDs or HIV. There is no   history of blood transfusion. They have no travel history to infectious disease endemic areas of the world.  The patient has seen their dentist in the last six month. They have seen their eye doctor in the last year. They admit to slight hearing difficulty with regard to whispered voices and some television programs.  They have deferred audiologic testing in the last year.  They do not  have excessive sun exposure. Discussed the need for sun protection: hats, long sleeves and use of sunscreen if there is significant sun exposure.   Diet: the importance of a healthy diet is discussed. They do have a healthy diet.  The benefits of regular aerobic exercise were discussed. She walks 4 times per week ,  20 minutes.   Depression screen: there are no signs or vegative symptoms of depression- irritability, change in appetite, anhedonia, sadness/tearfullness.  Cognitive assessment: the patient manages all their financial and personal affairs and is actively engaged. They could relate  day,date,year and events; recalled 2/3 objects at 3 minutes; performed clock-face test normally.  The following portions of the patient's history were reviewed and updated as appropriate: allergies, current medications, past family history, past medical history,  past surgical history, past social history  and problem list.  Visual acuity was not assessed per patient preference since she has regular follow up with her ophthalmologist. Hearing and body mass index were assessed and reviewed.   During the course of the visit the patient was educated and counseled about appropriate screening and preventive services including : fall prevention , diabetes screening, nutrition counseling, colorectal cancer screening, and recommended immunizations.    CC: The primary encounter diagnosis was Fatigue, unspecified type. Diagnoses of Subclinical hypothyroidism, Pure hypercholesterolemia, Need for vaccination with 13-polyvalent pneumococcal conjugate vaccine, GERD with esophagitis, Hiatal hernia, Neuritis or radiculitis due to rupture of lumbar intervertebral disc, Insomnia due to anxiety and fear, Encounter for preventive measure, and White coat syndrome without diagnosis of hypertension were also pertinent to this visit.  1) back pain: treated with oral prednisone by Kimberly Rojas's PA during his absence.  Felt terrible,   Then had multiple  ESI's due to severe pain which finally resolved after 2 weeks  After the last one. , finally  able to exercise,   2) chest pain: had normal stress test by Kimberly Rojas.   3) Hiatal hernia bothers her . USING Zantac once daily 75 mg dose   4) Light headed and tired .  Wasn't sleeping well until  recently . Not short of breath with walks . LH is brief and infrequent lasting < 5 sec . caused by rolling over bed .  Not caused by sitting up or standing up  5) HTN: home readings 114 to 120  Systolic ,  6) AFTERNOON FATIGUE:  Gets tired in the mid afternoon,  Needs a nap .  Not occurring  while being active     History Kimberly Rojas has a past medical history of Arthritis, Chicken pox, Fatigue, Hair loss, Heart murmur, Heartburn, Hyperlipidemia, Migraines, and Peptic ulcer.   She has a past surgical history that includes Cholecystectomy; Breast surgery; Shoulder open rotator cuff repair (Right, 03/31/2013); Breast biopsy (Left, 2010); Tonsillectomy and adenoidectomy; and Dilation and curettage of uterus.   Her family history includes Arthritis in her mother; Breast cancer (age of onset: 2178) in her mother; Chronic fatigue in her mother; Diabetes in her father and paternal grandfather.She reports that she has never smoked. She has never used smokeless tobacco. She reports that she drinks about 0.6 oz of alcohol per week. She reports that she does not use drugs.  Outpatient Medications Prior to Visit  Medication Sig Dispense Refill  . ALPRAZolam (XANAX) 0.25 MG tablet Take 1 tablet (0.25 mg total) by mouth daily as needed. for anxiety 30 tablet 0  . busPIRone (BUSPAR) 7.5 MG tablet Take 1 tablet (7.5 mg total) by mouth 3 (three) times daily. 90 tablet 3  . cholecalciferol (VITAMIN D) 1000 units tablet Take 1,000 Units by mouth daily.    . fluticasone (FLONASE) 50 MCG/ACT nasal spray Place into the nose.    . Multiple Vitamins-Minerals (MULTIVITAMIN PO) Take by mouth daily.    Marland Kitchen. omeprazole (PRILOSEC) 40 MG capsule Take 1 capsule (40 mg total) by mouth daily. 30 capsule 0  . pravastatin (PRAVACHOL) 20 MG tablet Take 1 tablet (20 mg total) by mouth daily. 90 tablet 0   No facility-administered medications prior to visit.     Review of Systems   Patient denies headache, fevers, malaise, unintentional weight loss, skin rash, eye pain, sinus congestion and sinus pain, sore throat, dysphagia,  hemoptysis , cough, dyspnea, wheezing, chest pain, palpitations, orthopnea, edema, abdominal pain, nausea, melena, diarrhea, constipation, flank pain, dysuria, hematuria, urinary  Frequency, nocturia,  numbness, tingling, seizures,  Focal weakness, Loss of consciousness,  Tremor, insomnia, depression, anxiety, and suicidal ideation.      Objective:  BP 134/70 (BP Location: Left Arm, Patient Position: Sitting, Cuff Size: Normal)   Pulse 73   Temp 98.1 F (36.7 C) (Oral)   Resp 14   Ht 5\' 1"  (1.549 m)   Wt 138 lb 9.6 oz (62.9 kg)   SpO2 98%   BMI 26.19 kg/m   Physical Exam   General appearance: alert, cooperative and appears stated age Head: Normocephalic, without obvious abnormality, atraumatic Eyes: conjunctivae/corneas clear. PERRL, EOM's intact. Fundi benign. Ears: normal TM's and external ear canals both ears Nose: Nares normal. Septum midline. Mucosa normal. No drainage or sinus tenderness. Throat: lips, mucosa, and tongue normal; teeth and gums normal Neck: no adenopathy, no carotid bruit, no JVD, supple, symmetrical, trachea midline and thyroid not enlarged, symmetric, no tenderness/mass/nodules Lungs: clear to auscultation bilaterally Breasts: normal appearance, no masses or tenderness Heart: regular rate and rhythm, S1, S2 normal, no murmur, click, rub or gallop Abdomen: soft, non-tender; bowel sounds normal; no masses,  no organomegaly Extremities: extremities normal, atraumatic, no cyanosis or edema Pulses: 2+ and symmetric Skin: Skin color, texture, turgor normal. No  rashes or lesions Neurologic: Alert and oriented X 3, normal strength and tone. Normal symmetric reflexes. Normal coordination and gait.      Assessment & Plan:   Problem List Items Addressed This Visit    Subclinical hypothyroidism   Relevant Orders   TSH   Pure hypercholesterolemia   Relevant Orders   Lipid panel   White coat syndrome without diagnosis of hypertension    .home readings have been checked are are normal .  no medications prescribed today   Lab Results  Component Value Date   CREATININE 0.92 09/12/2017   Lab Results  Component Value Date   NA 139 09/12/2017   K 3.7  09/12/2017   CL 102 09/12/2017   CO2 27 09/12/2017        Neuritis or radiculitis due to rupture of lumbar intervertebral disc    Secondary to spinal stenosis from L3 to L5 due to degenerative changes. No foot drop or leg weakness.  Gabapentin rx given,  continue  with serial  transforaminal  ESI by Chasnis       Relevant Medications   gabapentin (NEURONTIN) 100 MG capsule   Insomnia due to anxiety and fear    Trial of melatonin encouraged      Hiatal hernia   GERD with esophagitis    With hiatal hernia noted on prior evaluation.   Reviewed behavioral modifications including avoidance of mint, chocolate and alcohol and overeating.  Advised to remain upright for at least 2 hours after eating.  If symptoms are not controlled with 150 mg zantac once or twice daily , encouraged to resume a daily PPI       Fatigue - Primary    With recurrent episodes of hight headedness with sudden position change.  She has no history of anemia and was not orthostatic on taoday's exam.  Screening for other causes  In progress  Lab Results  Component Value Date   WBC 6.9 09/12/2017   HGB 14.1 09/12/2017   HCT 42.4 09/12/2017   MCV 89.3 09/12/2017   PLT 233 09/12/2017   Lab Results  Component Value Date   TSH 2.45 09/18/2017   No results found for: VITAMINB12       Relevant Orders   Hepatitis C antibody   Comprehensive metabolic panel   CBC with Differential/Platelet   Vitamin B12   Encounter for preventive measure    Annual comprehensive preventive exam was done as well as an evaluation and management of chronic conditions .  During the course of the visit the patient was educated and counseled about appropriate screening and preventive services including :  diabetes screening, lipid analysis with projected  10 year  risk for CAD , nutrition counseling, breast, cervical and colorectal cancer screening, and recommended immunizations.  Printed recommendations for health maintenance screenings  was given       Other Visit Diagnoses    Need for vaccination with 13-polyvalent pneumococcal conjugate vaccine       Relevant Orders   Pneumococcal conjugate vaccine 13-valent IM (Completed)     A total of 40 minutes was spent with patient more than half of which was spent in counseling patient on the above mentioned issues , reviewing and explaining recent labs and imaging studies done, and coordination of care.  I am having Kimberly Regal A. Near start on gabapentin. I am also having her maintain her Multiple Vitamins-Minerals (MULTIVITAMIN PO), cholecalciferol, fluticasone, ALPRAZolam, busPIRone, omeprazole, and pravastatin.  Meds ordered this encounter  Medications  .  gabapentin (NEURONTIN) 100 MG capsule    Sig: Take 1 capsule (100 mg total) by mouth 3 (three) times daily.    Dispense:  90 capsule    Refill:  3    There are no discontinued medications.  Follow-up: Return in about 6 months (around 05/29/2018).   Sherlene Shams, MD

## 2017-11-26 NOTE — Patient Instructions (Addendum)
Get the gabapentin and keep it for the next occurrence of severe low back pain while you are waiting for  Dr Shirlyn Goltz to respond  Trial Melatonin 5 mg  After dinner  For insomnia   Return for fasting labs in late June   You received the Prevnar vaccine today    Mediterranean Diet A Mediterranean diet refers to food and lifestyle choices that are based on the traditions of countries located on the [employer]. This way of eating has been shown to help prevent certain conditions and improve outcomes for people who have chronic diseases, like kidney disease and heart disease. What are tips for following this plan? Lifestyle  Cook and eat meals together with your family, when possible.  Drink enough fluid to keep your urine clear or pale yellow.  Be physically active every day. This includes: ? Aerobic exercise like running or swimming. ? Leisure activities like gardening, walking, or housework.  Get 7-8 hours of sleep each night.  If recommended by your health care provider, drink red wine in moderation. This means 1 glass a day for nonpregnant women and 2 glasses a day for men. A glass of wine equals 5 oz (150 mL). Reading food labels  Check the serving size of packaged foods. For foods such as rice and pasta, the serving size refers to the amount of cooked product, not dry.  Check the total fat in packaged foods. Avoid foods that have saturated fat or trans fats.  Check the ingredients list for added sugars, such as corn syrup. Shopping  At the grocery store, buy most of your food from the areas near the walls of the store. This includes: ? Fresh fruits and vegetables (produce). ? Grains, beans, nuts, and seeds. Some of these may be available in unpackaged forms or large amounts (in bulk). ? Fresh seafood. ? Poultry and eggs. ? Low-fat dairy products.  Buy whole ingredients instead of prepackaged foods.  Buy fresh fruits and vegetables in-season from local farmers  markets.  Buy frozen fruits and vegetables in resealable bags.  If you do not have access to quality fresh seafood, buy precooked frozen shrimp or canned fish, such as tuna, salmon, or sardines.  Buy small amounts of raw or cooked vegetables, salads, or olives from the deli or salad bar at your store.  Stock your pantry so you always have certain foods on hand, such as olive oil, canned tuna, canned tomatoes, rice, pasta, and beans. Cooking  Cook foods with extra-virgin olive oil instead of using butter or other vegetable oils.  Have meat as a side dish, and have vegetables or grains as your main dish. This means having meat in small portions or adding small amounts of meat to foods like pasta or stew.  Use beans or vegetables instead of meat in common dishes like chili or lasagna.  Experiment with different cooking methods. Try roasting or broiling vegetables instead of steaming or sauteing them.  Add frozen vegetables to soups, stews, pasta, or rice.  Add nuts or seeds for added healthy fat at each meal. You can add these to yogurt, salads, or vegetable dishes.  Marinate fish or vegetables using olive oil, lemon juice, garlic, and fresh herbs. Meal planning  Plan to eat 1 vegetarian meal one day each week. Try to work up to 2 vegetarian meals, if possible.  Eat seafood 2 or more times a week.  Have healthy snacks readily available, such as: ? Vegetable sticks with hummus. ? [place] yogurt. ?  Fruit and nut trail mix.  Eat balanced meals throughout the week. This includes: ? Fruit: 2-3 servings a day ? Vegetables: 4-5 servings a day ? Low-fat dairy: 2 servings a day ? Fish, poultry, or lean meat: 1 serving a day ? Beans and legumes: 2 or more servings a week ? Nuts and seeds: 1-2 servings a day ? Whole grains: 6-8 servings a day ? Extra-virgin olive oil: 3-4 servings a day  Limit red meat and sweets to only a few servings a month What are my food choices?  Mediterranean  diet ? Recommended ? Grains: Whole-grain pasta. Brown rice. Bulgar wheat. Polenta. Couscous. Whole-wheat bread. [place] Cobbatmeal. Quinoa. ? Vegetables: Artichokes. Beets. Broccoli. Cabbage. Carrots. Eggplant. Green beans. Chard. Kale. Spinach. Onions. Leeks. Peas. Squash. Tomatoes. Peppers. Radishes. ? Fruits: Apples. Apricots. Avocado. Berries. Bananas. Cherries. Dates. Figs. Grapes. Lemons. Melon. Oranges. Peaches. Plums. Pomegranate. ? Meats and other protein foods: Beans. Almonds. Sunflower seeds. Pine nuts. Peanuts. Cod. Salmon. Scallops. Shrimp. Tuna. Tilapia. Clams. Oysters. Eggs. ? Dairy: Low-fat milk. Cheese. Greek yogurt. ? Beverages: Water. Red wine. Herbal tea. ? Fats and oils: Extra virgin olive oil. Avocado oil. Grape seed oil. ? Sweets and desserts: [place] yogurt with honey. Baked apples. Poached pears. Trail mix. ? Seasoning and other foods: Basil. Cilantro. Coriander. Cumin. Mint. Parsley. Sage. Rosemary. Tarragon. Garlic. Oregano. Thyme. Pepper. Balsalmic vinegar. Tahini. Hummus. Tomato sauce. Olives. Mushrooms. ? Limit these ? Grains: Prepackaged pasta or rice dishes. Prepackaged cereal with added sugar. ? Vegetables: Deep fried potatoes (french fries). ? Fruits: Fruit canned in syrup. ? Meats and other protein foods: Beef. Pork. Lamb. Poultry with skin. Hot dogs. Tomasa BlaseBacon. ? Dairy: Ice cream. Sour cream. Whole milk. ? Beverages: Juice. Sugar-sweetened soft drinks. Beer. Liquor and spirits. ? Fats and oils: Butter. Canola oil. Vegetable oil. Beef fat (tallow). Lard. ? Sweets and desserts: Cookies. Cakes. Pies. Candy. ? Seasoning and other foods: Mayonnaise. Premade sauces and marinades. ? The items listed may not be a complete list. Talk with your dietitian about what dietary choices are right for you. Summary  The Mediterranean diet includes both food and lifestyle choices.  Eat a variety of fresh fruits and vegetables, beans, nuts, seeds, and whole grains.  Limit the amount of red  meat and sweets that you eat.  Talk with your health care provider about whether it is safe for you to drink red wine in moderation. This means 1 glass a day for nonpregnant women and 2 glasses a day for men. A glass of wine equals 5 oz (150 mL). This information is not intended to replace advice given to you by your health care provider. Make sure you discuss any questions you have with your health care provider. Document Released: 04/11/2016 Document Revised: 05/14/2016 Document Reviewed: 04/11/2016 Elsevier Interactive Patient Education  [employer]2018 Elsevier Inc.

## 2017-11-29 DIAGNOSIS — R5383 Other fatigue: Secondary | ICD-10-CM | POA: Insufficient documentation

## 2017-11-29 DIAGNOSIS — K449 Diaphragmatic hernia without obstruction or gangrene: Secondary | ICD-10-CM | POA: Insufficient documentation

## 2017-11-29 NOTE — Assessment & Plan Note (Signed)
With recurrent episodes of hight headedness with sudden position change.  She has no history of anemia and was not orthostatic on taoday's exam.  Screening for other causes  In progress  Lab Results  Component Value Date   WBC 6.9 09/12/2017   HGB 14.1 09/12/2017   HCT 42.4 09/12/2017   MCV 89.3 09/12/2017   PLT 233 09/12/2017   Lab Results  Component Value Date   TSH 2.45 09/18/2017   No results found for: [id number]

## 2017-11-29 NOTE — Assessment & Plan Note (Signed)
Trial of melatonin encouraged

## 2017-11-29 NOTE — Assessment & Plan Note (Signed)
.  home readings have been checked are are normal .  no medications prescribed today   Lab Results  Component Value Date   CREATININE 0.92 09/12/2017   Lab Results  Component Value Date   NA 139 09/12/2017   K 3.7 09/12/2017   CL 102 09/12/2017   CO2 27 09/12/2017

## 2017-11-29 NOTE — Assessment & Plan Note (Addendum)
With hiatal hernia noted on prior evaluation.   Reviewed behavioral modifications including avoidance of mint, chocolate and alcohol and overeating.  Advised to remain upright for at least 2 hours after eating.  If symptoms are not controlled with 150 mg zantac once or twice daily , encouraged to resume a daily PPI

## 2017-11-29 NOTE — Assessment & Plan Note (Signed)
Secondary to spinal stenosis from L3 to L5 due to degenerative changes. No foot drop or leg weakness.  Gabapentin rx given,  continue  with serial  transforaminal  ESI by Chasnis

## 2017-11-29 NOTE — Assessment & Plan Note (Signed)
Annual comprehensive preventive exam was done as well as an evaluation and management of chronic conditions .  During the course of the visit the patient was educated and counseled about appropriate screening and preventive services including :  diabetes screening, lipid analysis with projected  10 year  risk for CAD , nutrition counseling, breast, cervical and colorectal cancer screening, and recommended immunizations.  Printed recommendations for health maintenance screenings was given 

## 2018-02-26 ENCOUNTER — Other Ambulatory Visit (INDEPENDENT_AMBULATORY_CARE_PROVIDER_SITE_OTHER): Payer: PPO

## 2018-02-26 DIAGNOSIS — R5383 Other fatigue: Secondary | ICD-10-CM

## 2018-02-26 DIAGNOSIS — E7800 Pure hypercholesterolemia, unspecified: Secondary | ICD-10-CM

## 2018-02-26 DIAGNOSIS — E039 Hypothyroidism, unspecified: Secondary | ICD-10-CM | POA: Diagnosis not present

## 2018-02-26 DIAGNOSIS — E038 Other specified hypothyroidism: Secondary | ICD-10-CM

## 2018-02-26 LAB — VITAMIN B12: VITAMIN B 12: 383 pg/mL (ref 211–911)

## 2018-02-26 LAB — CBC WITH DIFFERENTIAL/PLATELET
BASOS ABS: 0.1 10*3/uL (ref 0.0–0.1)
Basophils Relative: 1.2 % (ref 0.0–3.0)
EOS ABS: 0.3 10*3/uL (ref 0.0–0.7)
Eosinophils Relative: 4.8 % (ref 0.0–5.0)
HEMATOCRIT: 38 % (ref 36.0–46.0)
Hemoglobin: 13 g/dL (ref 12.0–15.0)
LYMPHS ABS: 1.2 10*3/uL (ref 0.7–4.0)
LYMPHS PCT: 19.8 % (ref 12.0–46.0)
MCHC: 34.2 g/dL (ref 30.0–36.0)
MCV: 88.9 fl (ref 78.0–100.0)
MONOS PCT: 9.3 % (ref 3.0–12.0)
Monocytes Absolute: 0.6 10*3/uL (ref 0.1–1.0)
NEUTROS PCT: 64.9 % (ref 43.0–77.0)
Neutro Abs: 4 10*3/uL (ref 1.4–7.7)
Platelets: 210 10*3/uL (ref 150.0–400.0)
RBC: 4.27 Mil/uL (ref 3.87–5.11)
RDW: 13.3 % (ref 11.5–15.5)
WBC: 6.2 10*3/uL (ref 4.0–10.5)

## 2018-02-26 LAB — LIPID PANEL
CHOL/HDL RATIO: 4
Cholesterol: 206 mg/dL — ABNORMAL HIGH (ref 0–200)
HDL: 51.7 mg/dL (ref >39.00)
LDL Cholesterol: 135 mg/dL — ABNORMAL HIGH (ref 0–99)
NonHDL: 154.13
Triglycerides: 94 mg/dL (ref 0.0–149.0)
VLDL: 18.8 mg/dL (ref 0.0–40.0)

## 2018-02-26 LAB — COMPREHENSIVE METABOLIC PANEL
ALK PHOS: 85 U/L (ref 39–117)
ALT: 16 U/L (ref 0–35)
AST: 21 U/L (ref 0–37)
Albumin: 3.9 g/dL (ref 3.5–5.2)
BUN: 19 mg/dL (ref 6–23)
CALCIUM: 9.4 mg/dL (ref 8.4–10.5)
CO2: 30 mEq/L (ref 19–32)
Chloride: 104 mEq/L (ref 96–112)
Creatinine, Ser: 0.87 mg/dL (ref 0.40–1.20)
GFR: 67.66 mL/min (ref >60.00)
Glucose, Bld: 99 mg/dL (ref 70–99)
POTASSIUM: 4.2 meq/L (ref 3.5–5.1)
Sodium: 140 mEq/L (ref 135–145)
TOTAL PROTEIN: 7.1 g/dL (ref 6.0–8.3)
Total Bilirubin: 0.4 mg/dL (ref 0.2–1.2)

## 2018-02-26 LAB — TSH: TSH: 7.75 u[IU]/mL — AB (ref 0.35–4.50)

## 2018-02-27 LAB — HEPATITIS C ANTIBODY
HEP C AB: NONREACTIVE
SIGNAL TO CUT-OFF: 0.01 (ref <1.00)

## 2018-03-01 ENCOUNTER — Encounter: Payer: Self-pay | Admitting: Internal Medicine

## 2018-03-03 ENCOUNTER — Other Ambulatory Visit: Payer: Self-pay | Admitting: Internal Medicine

## 2018-03-03 DIAGNOSIS — E039 Hypothyroidism, unspecified: Secondary | ICD-10-CM

## 2018-03-03 MED ORDER — LEVOTHYROXINE SODIUM 25 MCG PO TABS: 25.0000 ug | ORAL_TABLET | Freq: Every day | ORAL | 0 refills | Status: DC

## 2018-03-03 NOTE — Assessment & Plan Note (Signed)
Starting levothyroxine 25 mcg daily  °

## 2018-03-09 DIAGNOSIS — H25013 Cortical age-related cataract, bilateral: Secondary | ICD-10-CM | POA: Diagnosis not present

## 2018-03-09 DIAGNOSIS — H35361 Drusen (degenerative) of macula, right eye: Secondary | ICD-10-CM | POA: Diagnosis not present

## 2018-03-09 DIAGNOSIS — H2513 Age-related nuclear cataract, bilateral: Secondary | ICD-10-CM | POA: Diagnosis not present

## 2018-03-09 DIAGNOSIS — H43813 Vitreous degeneration, bilateral: Secondary | ICD-10-CM | POA: Diagnosis not present

## 2018-03-30 ENCOUNTER — Encounter: Payer: Self-pay | Admitting: Internal Medicine

## 2018-03-31 ENCOUNTER — Other Ambulatory Visit: Payer: Self-pay | Admitting: Internal Medicine

## 2018-04-02 ENCOUNTER — Other Ambulatory Visit (INDEPENDENT_AMBULATORY_CARE_PROVIDER_SITE_OTHER): Payer: PPO

## 2018-04-02 DIAGNOSIS — E039 Hypothyroidism, unspecified: Secondary | ICD-10-CM | POA: Diagnosis not present

## 2018-04-02 LAB — TSH: TSH: 3.52 u[IU]/mL (ref 0.35–4.50)

## 2018-04-06 ENCOUNTER — Encounter: Payer: Self-pay | Admitting: Internal Medicine

## 2018-04-06 DIAGNOSIS — E039 Hypothyroidism, unspecified: Secondary | ICD-10-CM

## 2018-04-09 ENCOUNTER — Encounter: Payer: Self-pay | Admitting: Family Medicine

## 2018-04-09 ENCOUNTER — Ambulatory Visit (INDEPENDENT_AMBULATORY_CARE_PROVIDER_SITE_OTHER): Payer: PPO | Admitting: Family Medicine

## 2018-04-09 VITALS — BP 130/78 | HR 66 | Temp 97.5°F | Wt 136.1 lb

## 2018-04-09 DIAGNOSIS — R519 Headache, unspecified: Secondary | ICD-10-CM

## 2018-04-09 DIAGNOSIS — B372 Candidiasis of skin and nail: Secondary | ICD-10-CM

## 2018-04-09 DIAGNOSIS — L0390 Cellulitis, unspecified: Secondary | ICD-10-CM | POA: Diagnosis not present

## 2018-04-09 DIAGNOSIS — R51 Headache: Secondary | ICD-10-CM

## 2018-04-09 MED ORDER — CEPHALEXIN 500 MG PO CAPS: 500.0000 mg | ORAL_CAPSULE | Freq: Two times a day (BID) | ORAL | 0 refills | Status: AC

## 2018-04-09 MED ORDER — NYSTATIN 100000 UNIT/GM EX CREA: 1.0000 "application " | TOPICAL_CREAM | Freq: Two times a day (BID) | CUTANEOUS | 0 refills | Status: AC

## 2018-04-09 NOTE — Patient Instructions (Signed)
Great to meet you!  Nystatin cream and Keflex pills sent to pharmacy  Cleanse skin with mild soap & water, pat dry  Can use tylenol as needed for headache and can also drink a small amount of caffeine (like a small cup of coffee)

## 2018-04-09 NOTE — Progress Notes (Signed)
Subjective:    Patient ID: Kimberly Rojas, female    DOB: 1946-03-08, 72 y.o.   MRN: [id number]  HPI  Patient presents to clinic complaining of red sore area on navel.  States this happened about 6 weeks ago, she put Neosporin on area for a few days and it resolved.  States she noticed the redness and itching in the navel returning so she decided to come in and have the area checked.   Also reports a mild headache on top of head off and on for the past week.  States she recently cut back on her caffeine intake.  States at nighttime she has taken a Tylenol on occasion and upon waking in the morning the headache is improved.  Denies any vision changes, denies any speech difficulty denies any coordination issues.  Denies fever or chills.  Denies head injury.   Patient Active Problem List   Diagnosis Date Noted  . Hiatal hernia 11/29/2017  . Fatigue 11/29/2017  . Atypical chest pain 09/20/2017  . GERD with esophagitis 09/20/2017  . Generalized anxiety disorder 09/20/2017  . White coat syndrome without diagnosis of hypertension 05/21/2017  . Osteopenia 01/09/2017  . 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 Chronic systolic dysfunction of right ventricle 11/05/2015  . Encounter for preventive measure 11/05/2015  . Grief 11/05/2015  . Acquired hypothyroidism 11/23/2014  . Pure hypercholesterolemia 11/23/2014  . Neuritis or radiculitis due to rupture of lumbar intervertebral disc 04/20/2014  . Complete tear of rotator cuff 03/31/2013    Social History   Tobacco Use  . Smoking status: Never Smoker  . Smokeless tobacco: Never Used  Substance Use Topics  . Alcohol use: Yes    Alcohol/week: 1.0 standard drinks    Types: 1 Glasses of wine per week   Review of Systems  Constitutional: Negative for chills, fatigue and fever.  HENT: Positive for dental problem. Negative for congestion, postnasal drip, sinus pressure, sinus pain, trouble swallowing and voice change.        Has  upcoming dental procedure - ?this could relate to headache.   Eyes: Negative for photophobia, pain and visual disturbance.  Respiratory: Negative for cough, shortness of breath and wheezing.   Cardiovascular: Negative for chest pain, palpitations and leg swelling.  Gastrointestinal: Negative.   Genitourinary: Negative.   Musculoskeletal: Positive for neck pain.       Notes occasional neck pain.  Skin: Negative for color change, pallor and rash.  Neurological: Positive for headaches. Negative for dizziness and syncope.      Objective:   Physical Exam  Constitutional: She is oriented to person, place, and time. She appears well-developed and well-nourished. No distress.  HENT:  Head: Normocephalic and atraumatic.  Eyes: Pupils are equal, round, and reactive to light. Conjunctivae and EOM are normal. Right eye exhibits no discharge. Left eye exhibits no discharge. No scleral icterus.  Neck: Normal range of motion. Neck supple. No JVD present. No tracheal deviation present.  Cardiovascular: Normal rate, regular rhythm and normal heart sounds.  No murmur heard. Pulmonary/Chest: Effort normal and breath sounds normal. No respiratory distress. She has no wheezes. She has no rales.  Musculoskeletal: Normal range of motion. She exhibits no tenderness or deformity.  Lymphadenopathy:    She has no cervical adenopathy.  Neurological: She is alert and oriented to person, place, and time. No cranial nerve deficit. Coordination normal.  Gait normal. Speech clear. Smile symmetrical.   Skin: Skin is warm and dry. There is  erythema.  Red and flaking skin in creases of naval skin consistent with fungal type rash. Some redness surrounding naval, most likely from patient scratching skin.   Psychiatric: She has a normal mood and affect. Her behavior is normal. Thought content normal.  Nursing note and vitals reviewed.     Vitals:   04/09/18 0951  BP: 130/78  Pulse: 66  Temp: (!) 97.5 F (36.4 C)    SpO2: 100%    Assessment & Plan:   Candida of skin Equities trader) -patient advised to cleanse skin with mild soap and water, dry.  Patient will then apply thin layer cream twice per day.  Patient encouraged to avoid scrubbing/scratching skin as this irritates the skin surface skin for infection.  Cellulitis -  patient will do short course of Keflex 500 mg twice a day to cover skin infection.  Headache - suspect this could be due to decreasing caffeine intake.  Patient advised to continue to use Tylenol as needed for headache and also trying drinking some caffeine. Also advised she can use a cold compress on head to treat pain. It is possible headache is connected to dental pain - keep upcoming dental appt. If headache becomes worse or you develop any neurological symptoms - contact office right away.   Keep follow up in September as scheduled. You can return to clinic sooner if needed.

## 2018-04-10 DIAGNOSIS — M5134 Other intervertebral disc degeneration, thoracic region: Secondary | ICD-10-CM | POA: Diagnosis not present

## 2018-04-10 DIAGNOSIS — M62830 Muscle spasm of back: Secondary | ICD-10-CM | POA: Diagnosis not present

## 2018-04-10 DIAGNOSIS — M5416 Radiculopathy, lumbar region: Secondary | ICD-10-CM | POA: Diagnosis not present

## 2018-04-10 DIAGNOSIS — M5136 Other intervertebral disc degeneration, lumbar region: Secondary | ICD-10-CM | POA: Diagnosis not present

## 2018-04-17 ENCOUNTER — Telehealth: Payer: Self-pay

## 2018-04-17 NOTE — Telephone Encounter (Signed)
Copied from CRM [id number]. Topic: Referral - Status >> Apr 17, 2018  2:35 PM Angela NevinWilliams, Candice N wrote: Reason for CRM: Pt states that [facility] has not been received by them. KC advised pt to call office and get it resent. Please advise.

## 2018-04-20 NOTE — Telephone Encounter (Signed)
[facility] to [facility] on 8/16

## 2018-04-22 DIAGNOSIS — E039 Hypothyroidism, unspecified: Secondary | ICD-10-CM | POA: Diagnosis not present

## 2018-04-22 DIAGNOSIS — E041 Nontoxic single thyroid nodule: Secondary | ICD-10-CM | POA: Diagnosis not present

## 2018-05-29 ENCOUNTER — Encounter: Payer: Self-pay | Admitting: Internal Medicine

## 2018-05-29 ENCOUNTER — Ambulatory Visit (INDEPENDENT_AMBULATORY_CARE_PROVIDER_SITE_OTHER): Payer: PPO | Admitting: Internal Medicine

## 2018-05-29 VITALS — BP 118/74 | HR 65 | Temp 98.3°F | Resp 14 | Ht 61.0 in | Wt 135.4 lb

## 2018-05-29 DIAGNOSIS — K2100 Gastro-esophageal reflux disease with esophagitis, without bleeding: Secondary | ICD-10-CM

## 2018-05-29 DIAGNOSIS — Z1231 Encounter for screening mammogram for malignant neoplasm of breast: Secondary | ICD-10-CM

## 2018-05-29 DIAGNOSIS — F411 Generalized anxiety disorder: Secondary | ICD-10-CM | POA: Diagnosis not present

## 2018-05-29 DIAGNOSIS — F5105 Insomnia due to other mental disorder: Secondary | ICD-10-CM | POA: Diagnosis not present

## 2018-05-29 DIAGNOSIS — F409 Phobic anxiety disorder, unspecified: Secondary | ICD-10-CM

## 2018-05-29 DIAGNOSIS — M85859 Other specified disorders of bone density and structure, unspecified thigh: Secondary | ICD-10-CM | POA: Diagnosis not present

## 2018-05-29 DIAGNOSIS — Z1239 Encounter for other screening for malignant neoplasm of breast: Secondary | ICD-10-CM

## 2018-05-29 MED ORDER — ZOSTER VAC RECOMB ADJUVANTED 50 MCG/0.5ML IM SUSR: 0.5000 mL | Freq: Once | INTRAMUSCULAR | 1 refills | Status: AC

## 2018-05-29 NOTE — Progress Notes (Signed)
Subjective:  Patient ID: Kimberly Rojas, female    DOB: February 24, 1946  Age: 72 y.o. MRN: [id number]  CC: The primary encounter diagnosis was Breast cancer screening. Diagnoses of Generalized anxiety disorder, GERD with esophagitis, Insomnia due to anxiety and fear, and Osteopenia of hip, unspecified laterality were also pertinent to this visit.  HPI Kimberly Rojas presents for follow up on anxiety,  hypothyroidisnm , other chronic issues .  She feels generally well,  And has no acute or ongoing complaints today.   Treated August 8 by LG for skin rash attributed  to cellulitis/candidiasis  Of navel with keflex and nystatin .Rash has resolved.    Also reported recurrent headache in setting of caffeine reduction .  headaches has resolved as  well   History  right rotator cuff  surgery over 5 years ago  ,  Exercising regularly   Had lifeline screening .  Results reviewed with patient today.  Thyroid nodule found during carotid  Ultrasound.  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  EKG was NSR and she had normal aortic ultrasound and ABI's   Bone (heel)l density was normal . Mild osteopenia by 2018 DEXA      Outpatient Medications Prior to Visit  Medication Sig Dispense Refill  . ALPRAZolam (XANAX) 0.25 MG tablet Take 1 tablet (0.25 mg total) by mouth daily as needed. for anxiety 30 tablet 0  . busPIRone (BUSPAR) 7.5 MG tablet Take 1 tablet (7.5 mg total) by mouth 3 (three) times daily. 90 tablet 3  . cholecalciferol (VITAMIN D) 1000 units tablet Take 1,000 Units by mouth daily.    Marland Kitchen gabapentin (NEURONTIN) 100 MG capsule Take 1 capsule (100 mg total) by mouth 3 (three) times daily. 90 capsule 3  . levothyroxine (SYNTHROID, LEVOTHROID) 25 MCG tablet Take 1 tablet (25 mcg total) by mouth daily. 90 tablet 0  . Multiple Vitamins-Minerals (MULTIVITAMIN PO) Take by mouth daily.    Marland Kitchen omeprazole (PRILOSEC) 40 MG capsule Take 1 capsule (40 mg total) by  mouth daily. 30 capsule 0  . fluticasone (FLONASE) 50 MCG/ACT nasal spray Place into the nose.    . pravastatin (PRAVACHOL) 20 MG tablet Take 1 tablet (20 mg total) by mouth daily. 90 tablet 0   No facility-administered medications prior to visit.     Review of Systems;  Patient denies headache, fevers, malaise, unintentional weight loss, skin rash, eye pain, sinus congestion and sinus pain, sore throat, dysphagia,  hemoptysis , cough, dyspnea, wheezing, chest pain, palpitations, orthopnea, edema, abdominal pain, nausea, melena, diarrhea, constipation, flank pain, dysuria, hematuria, urinary  Frequency, nocturia, numbness, tingling, seizures,  Focal weakness, Loss of consciousness,  Tremor, insomnia, depression, anxiety, and suicidal ideation.      Objective:  BP 118/74 (BP Location: Left Arm, Patient Position: Sitting, Cuff Size: Normal)   Pulse 65   Temp 98.3 F (36.8 C) (Oral)   Resp 14   Ht 5\' 1"  (1.549 m)   Wt 135 lb 6.4 oz (61.4 kg)   SpO2 97%   BMI 25.58 kg/m   BP Readings from Last 3 Encounters:  05/29/18 118/74  04/09/18 130/78  11/26/17 134/70    Wt Readings from Last 3 Encounters:  05/29/18 135 lb 6.4 oz (61.4 kg)  04/09/18 136 lb 2 oz (61.7 kg)  11/26/17 138 lb 9.6 oz (62.9 kg)    General appearance: alert, cooperative and appears stated age Ears: normal TM's and external ear canals  both ears Throat: lips, mucosa, and tongue normal; teeth and gums normal Neck: no adenopathy, no carotid bruit, supple, symmetrical, trachea midline and thyroid not enlarged, symmetric, no tenderness/mass/nodules Back: symmetric, no curvature. ROM normal. No CVA tenderness. Lungs: clear to auscultation bilaterally Heart: regular rate and rhythm, S1, S2 normal, no murmur, click, rub or gallop Abdomen: soft, non-tender; bowel sounds normal; no masses,  no organomegaly Pulses: 2+ and symmetric Skin: Skin color, texture, turgor normal. No rashes or lesions Lymph nodes: Cervical,  supraclavicular, and axillary nodes normal.  Lab Results  Component Value Date   HGBA1C 5.7 10/24/2015    Lab Results  Component Value Date   CREATININE 0.87 02/26/2018   CREATININE 0.92 09/12/2017   CREATININE 0.84 05/21/2017    Lab Results  Component Value Date   WBC 6.2 02/26/2018   HGB 13.0 02/26/2018   HCT 38.0 02/26/2018   PLT 210.0 02/26/2018   GLUCOSE 99 02/26/2018   CHOL 206 (H) 02/26/2018   TRIG 94.0 02/26/2018   HDL 51.70 02/26/2018   LDLDIRECT 141.0 09/18/2017   LDLCALC 135 (H) 02/26/2018   ALT 16 02/26/2018   AST 21 02/26/2018   NA 140 02/26/2018   K 4.2 02/26/2018   CL 104 02/26/2018   CREATININE 0.87 02/26/2018   BUN 19 02/26/2018   CO2 30 02/26/2018   TSH 3.52 04/02/2018   INR 1.03 03/26/2013   HGBA1C 5.7 10/24/2015    Dg Chest 2 View  Result Date: 09/12/2017 CLINICAL DATA:  Chest pain EXAM: CHEST  2 VIEW COMPARISON:  None. FINDINGS: There is a tiny granuloma in the right mid lung. There is no edema or consolidation. The heart size and pulmonary vascularity are normal. No adenopathy. There is mild degenerative change in the thoracic spine. IMPRESSION: No edema or consolidation. Electronically Signed   By: Bretta Bang III M.D.   On: 09/12/2017 11:18    Assessment & Plan:   Problem List Items Addressed This Visit    Generalized anxiety disorder    Continue buspirone bid/tid      GERD with esophagitis    With hiatal hernia noted on prior evaluation.   Reviewed behavioral modifications including avoidance of mint, chocolate and alcohol and overeating.  Advised to remain upright for at least 2 hours after eating.  If symptoms are not controlled with  50 mg of famotidine daily , encouraged to resume a daily PPI       Insomnia due to anxiety and fear    Discussed natural remedies for insomnia including herbal tea and melatonin on  a chronic basis  And reserve alprazolam for severe cases. The risks and benefits of benzodiazepine use were discussed  with patient today including excessive sedation leading to respiratory depression,  impaired thinking/driving, and addiction.  Patient was advised to avoid concurrent use with alcohol, to use medication only as needed and not to share with others  . Marland Kitchen  Reviewd principles of good sleep hygiene      Osteopenia    Bone Density scores from 2018reviewed she has osteopenia,  Mild.  Would repeat in 2023 and consider therapy then if there is a significant change. Continue calcium, vitamin d and weight bearing exercise on a regular basis.        Other Visit Diagnoses    Breast cancer screening    -  Primary   Relevant Orders   MM 3D SCREEN BREAST BILATERAL    A total of 25 minutes of face to face time was  spent with patient more than half of which was spent in counselling about the above mentioned conditions  and coordination of care   I have discontinued Okey Regal A. Alamillo's pravastatin. I am also having her start on Zoster Vaccine Adjuvanted. Additionally, I am having her maintain her Multiple Vitamins-Minerals (MULTIVITAMIN PO), cholecalciferol, fluticasone, ALPRAZolam, busPIRone, omeprazole, gabapentin, and levothyroxine.  Meds ordered this encounter  Medications  . Zoster Vaccine Adjuvanted [facility]) injection    Sig: Inject 0.5 mLs into the muscle once for 1 dose.    Dispense:  1 each    Refill:  1    Medications Discontinued During This Encounter  Medication Reason  . pravastatin (PRAVACHOL) 20 MG tablet     Follow-up: Return in about 6 months (around 11/27/2018) for CPE.   Sherlene Shams, MD

## 2018-05-29 NOTE — Patient Instructions (Addendum)
Your annual mammogram has been ordered.  You are encouraged (required) to call to make your appointment at [place]  [phone]  Your life line screening tests  Are excellent   [employer] medicated powder with zinc can prevent recurrent yeast infections under the breast , ,  In the navel  But not to be used vaginally     I ecommend getting half  of  your calcium and Vitamin D  through diet rather than supplements given the recent association of calcium supplements with increased coronary artery calcium scores  Try the almond, soy  cashew milks that most grocery stores  now carry  in the dairy  Section>   They are lactose free:  [employer] brand Almond Light,  Original formula.  Delicious,  Low carb,  Low cal,  Cholesterol free   increase your vitamin D intake to 2 x your current dose from November through March   You can get 42 ct ome omeprazole  20 mg at [facility] for  $15   Try using famotidine before spicy meals instead of omeprazole.  The  20 mg dose is  available at [facility]  The ShingRx vaccine is now available in local pharmacies and is much more protective thant Zostavaxs,  It is therefore ADVISED for all interested adults over 50 to prevent shingles

## 2018-05-31 NOTE — Assessment & Plan Note (Signed)
With hiatal hernia noted on prior evaluation.   Reviewed behavioral modifications including avoidance of mint, chocolate and alcohol and overeating.  Advised to remain upright for at least 2 hours after eating.  If symptoms are not controlled with  50 mg of famotidine daily , encouraged to resume a daily PPI

## 2018-05-31 NOTE — Assessment & Plan Note (Signed)
Continue buspirone bid/tid 

## 2018-05-31 NOTE — Assessment & Plan Note (Signed)
Bone Density scores from 2018 reviewed:  she has osteopenia,  Mild.  Would repeat in 2023 and consider therapy then if there is a significant change. Continue calcium, vitamin d and weight bearing exercise on a regular basis.  

## 2018-05-31 NOTE — Assessment & Plan Note (Addendum)
Discussed natural remedies for insomnia including herbal tea and melatonin on  a chronic basis  And reserve alprazolam for severe cases. The risks and benefits of benzodiazepine use were discussed with patient today including excessive sedation leading to respiratory depression,  impaired thinking/driving, and addiction.  Patient was advised to avoid concurrent use with alcohol, to use medication only as needed and not to share with others  . Marland Kitchen  Reviewd principles of good sleep hygiene

## 2018-06-24 DIAGNOSIS — E039 Hypothyroidism, unspecified: Secondary | ICD-10-CM | POA: Diagnosis not present

## 2018-06-24 DIAGNOSIS — E041 Nontoxic single thyroid nodule: Secondary | ICD-10-CM | POA: Diagnosis not present

## 2018-07-01 DIAGNOSIS — E039 Hypothyroidism, unspecified: Secondary | ICD-10-CM | POA: Diagnosis not present

## 2018-07-07 ENCOUNTER — Ambulatory Visit
Admission: RE | Admit: 2018-07-07 | Discharge: 2018-07-07 | Disposition: A | Discharge status: Home / Self Care | Payer: PPO | Source: Ambulatory Visit | Attending: Internal Medicine | Admitting: Internal Medicine

## 2018-07-07 DIAGNOSIS — Z1231 Encounter for screening mammogram for malignant neoplasm of breast: Secondary | ICD-10-CM | POA: Diagnosis not present

## 2018-07-07 DIAGNOSIS — Z1239 Encounter for other screening for malignant neoplasm of breast: Secondary | ICD-10-CM

## 2018-07-09 DIAGNOSIS — H9319 Tinnitus, unspecified ear: Secondary | ICD-10-CM | POA: Diagnosis not present

## 2018-07-09 DIAGNOSIS — H903 Sensorineural hearing loss, bilateral: Secondary | ICD-10-CM | POA: Diagnosis not present

## 2018-07-09 DIAGNOSIS — M26629 Arthralgia of temporomandibular joint, unspecified side: Secondary | ICD-10-CM | POA: Diagnosis not present

## 2018-09-01 ENCOUNTER — Ambulatory Visit: Payer: Self-pay

## 2018-09-01 NOTE — Telephone Encounter (Signed)
Phone call from pt. With c/o intermittent "mild" dizziness.  Stated she has had symptoms off and on, since she saw Dr. Darrick Huntsmanullo last September.  Reported the episodes have increased over past 2 weeks.  Reported the episodes are short in duration, and usually occur if she closes her eyes in the shower, or in bed.  Reported she feels off balance, but when she opens her eyes, the symptoms improve.  Described the feeling as "head is spinning, or the room is spinning."  Denied feeling weak or faint.  Stated she does not drink water very well, as it makes her nauseated.  Enc. to try to hydrate with decaffeinated beverages.  Pulse rate per Apple Watch is "64-73", during Triage call.  Stated her BP is usually low; "112" systolic.  Denied any feeling of shortness of breath or chest palpitations, recently.  Reported she remembered about 2 mos. ago, episode of feeling her heart rate increase.  Appt. Scheduled for 09/04/18 with Nurse Practitioner.  Care Advice given per protocol; verb. understanding.    Reason for Disposition . [1] MILD dizziness (e.g., walking normally) AND [2] has NOT been evaluated by physician for this  (Exception: dizziness caused by heat exposure, sudden standing, or poor fluid intake)  Answer Assessment - Initial Assessment Questions 1. DESCRIPTION: "Describe your dizziness."     If closes eyes, feels like head and the room is spinning; in shower and in bed ; c/o feeling off balance;  When opens eyes she feels okay.    2. LIGHTHEADED: "Do you feel lightheaded?" (e.g., somewhat faint, woozy, weak upon standing)     Denied feeling weak or faint 3. VERTIGO: "Do you feel like either you or the room is spinning or tilting?" (i.e. vertigo)     Feels like the room is spinning 4. SEVERITY: "How bad is it?"  "Do you feel like you are going to faint?" "Can you stand and walk?"   - MILD - walking normally   - MODERATE - interferes with normal activities (e.g., work, school)    - SEVERE - unable to stand,  requires support to walk, feels like passing out now.      Mild   5. ONSET:  "When did the dizziness begin?"     About  6. AGGRAVATING FACTORS: "Does anything make it worse?" (e.g., standing, change in head position)    Isn't sure if Benadryl aggravates it.   7. HEART RATE: "Can you tell me your heart rate?" "How many beats in 15 seconds?"  (Note: not all patients can do this)       Apple watch- pulse 64-73 8. CAUSE: "What do you think is causing the dizziness?"     unknown  9. RECURRENT SYMPTOM: "Have you had dizziness before?" If so, ask: "When was the last time?" "What happened that time?"     Did mention it to the doctor; was told it might be vertigo.  10. OTHER SYMPTOMS: "Do you have any other symptoms?" (e.g., fever, chest pain, vomiting, diarrhea, bleeding)       Denied any shortness of breath or chest palpitations at this time.  Felt she had an episode about 2 mos. ago with feeling heart rate increasing.  11. PREGNANCY: "Is there any chance you are pregnant?" "When was your last menstrual period?"      N/a  Protocols used: DIZZINESS [facility]- LIGHTHEADEDNESS-A-AH

## 2018-09-04 ENCOUNTER — Ambulatory Visit (INDEPENDENT_AMBULATORY_CARE_PROVIDER_SITE_OTHER): Payer: Medicare Other | Admitting: Family Medicine

## 2018-09-04 ENCOUNTER — Encounter: Payer: Self-pay | Admitting: Family Medicine

## 2018-09-04 VITALS — BP 126/70 | HR 73 | Temp 97.5°F | Ht 61.0 in | Wt 137.8 lb

## 2018-09-04 DIAGNOSIS — H8110 Benign paroxysmal vertigo, unspecified ear: Secondary | ICD-10-CM | POA: Diagnosis not present

## 2018-09-04 DIAGNOSIS — R42 Dizziness and giddiness: Secondary | ICD-10-CM | POA: Diagnosis not present

## 2018-09-04 MED ORDER — MECLIZINE HCL 12.5 MG PO TABS: 12.5000 mg | ORAL_TABLET | Freq: Three times a day (TID) | ORAL | 1 refills | Status: DC | PRN

## 2018-09-04 NOTE — Progress Notes (Signed)
Subjective:    Patient ID: Kimberly Rojas, female    DOB: Mar 03, 1946, 73 y.o.   MRN: [id number]  HPI  Presents to clinic c/o intermittent dizziness for months.  Patient states the dizziness is most notable possible when she bends over to pick something up and then stands back up, and when first getting up out of bed.  States he usually when she sits and rests and does not move her head, the dizziness will resolve.  Denies any loss of vision, denies speech difficulty, denies one-sided weakness, denies facial droop denies any fainting.  Patient Active Problem List   Diagnosis Date Noted  . Hiatal hernia 11/29/2017  . Fatigue 11/29/2017  . Atypical chest pain 09/20/2017  . GERD with esophagitis 09/20/2017  . Generalized anxiety disorder 09/20/2017  . White coat syndrome without diagnosis of hypertension 05/21/2017  . Osteopenia 01/09/2017  . 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 Chronic systolic dysfunction of right ventricle 11/05/2015  . Encounter for preventive measure 11/05/2015  . Grief 11/05/2015  . Acquired hypothyroidism 11/23/2014  . Pure hypercholesterolemia 11/23/2014  . Neuritis or radiculitis due to rupture of lumbar intervertebral disc 04/20/2014  . Complete tear of rotator cuff 03/31/2013   Social History   Tobacco Use  . Smoking status: Never Smoker  . Smokeless tobacco: Never Used  Substance Use Topics  . Alcohol use: Yes    Alcohol/week: 1.0 standard drinks    Types: 1 Glasses of wine per week   Review of Systems  Constitutional: Negative for chills, fatigue and fever.  HENT: Negative for congestion, ear pain, sinus pain and sore throat.   Eyes: Negative.   Respiratory: Negative for cough, shortness of breath and wheezing.   Cardiovascular: Negative for chest pain, palpitations and leg swelling.  Gastrointestinal: Negative for abdominal pain, diarrhea, nausea and vomiting.  Genitourinary: Negative for dysuria, frequency and urgency.    Musculoskeletal: Negative for arthralgias and myalgias.  Skin: Negative for color change, pallor and rash.  Neurological: Negative for syncope. +dizziness  Psychiatric/Behavioral: The patient is not nervous/anxious.       Objective:   Physical Exam   Constitutional: She appears well-developed and well-nourished. No distress.  HENT:  Head: Normocephalic and atraumatic.  Eyes: Pupils are equal, round, and reactive to light. EOM are normal. No scleral icterus.  Neck: Normal range of motion. Neck supple. No tracheal deviation present.  Cardiovascular: Normal rate, regular rhythm and normal heart sounds.  Pulmonary/Chest: Effort normal and breath sounds normal. No respiratory distress. She has no wheezes. She has no rales.  Neurological: She is alert and oriented to person, place, and time.  Gait normal.  Grips equal and strong.  Dorsi plantar flexion equal and strong.  Smile symmetrical, can raise eyebrows/puff out cheeks/clenched teeth without any issues. Skin: Skin is warm and dry. No pallor.  Psychiatric: She has a normal mood and affect. Her behavior is normal. Thought content normal.  Nursing note and vitals reviewed.      Vitals:   09/04/18 1013  BP: 126/70  Pulse: 73  Temp: (!) 97.5 F (36.4 C)  SpO2: 98%    Assessment & Plan:   Dizziness/benign positional vertigo - due to dizziness being intermittent off and on for months and also description of dizziness episodes occurring with position changes I strongly suspect that benign positional vertigo is the cause.  Patient advised to trial taking a daily nondrowsy antihistamine such as Claritin and also can use prescription meclizine if needed  for breakthrough dizziness.  Advised to keep self well-hydrated and avoid fast changes position or fast head movements.  Patient given instructions outlining alarm symptoms that could indicate something more serious is going on and was advised to call office right away and/or go to ER if any  of the symptoms develop.  We will also get MRI of brain to rule out any other cause for dizziness.  Patient will keep regularly scheduled follow-up with PCP as planned.  Advised that we will contact her in regards to MRI appointment.

## 2018-09-04 NOTE — Patient Instructions (Addendum)
Try taking a 10 mg loratadine (claritin) once daily in AM and then can use meclizine if needed for breakthrough dizziness.    Vertigo  Vertigo means that you feel like you are moving when you are not. Vertigo can also make you feel like things around you are moving when they are not. This feeling can come and go at any time. Vertigo often goes away on its own. Follow these instructions at home:  Avoid making fast movements.  Avoid driving.  Avoid using heavy machinery.  Avoid doing any task or activity that might cause danger to you or other people if you would have a vertigo attack while you are doing it.  Sit down right away if you feel dizzy or have trouble with your balance.  Take over-the-counter and prescription medicines only as told by your doctor.  Follow instructions from your doctor about which positions or movements you should avoid.  Drink enough fluid to keep your pee (urine) clear or pale yellow.  Keep all follow-up visits as told by your doctor. This is important. Contact a doctor if:  Medicine does not help your vertigo.  You have a fever.  Your problems get worse or you have new symptoms.  Your family or friends see changes in your behavior.  You feel sick to your stomach (nauseous) or you throw up (vomit).  You have a "pins and needles" feeling or you are numb in part of your body. Get help right away if:  You have trouble moving or talking.  You are always dizzy.  You pass out (faint).  You get very bad headaches.  You feel weak or have trouble using your hands, arms, or legs.  You have changes in your hearing.  You have changes in your seeing (vision).  You get a stiff neck.  Bright light starts to bother you. This information is not intended to replace advice given to you by your health care provider. Make sure you discuss any questions you have with your health care provider. Document Released: 05/28/2008 Document Revised: 01/25/2016  Document Reviewed: 12/12/2014 Elsevier Interactive Patient Education  [employer].

## 2018-09-09 DIAGNOSIS — M25562 Pain in left knee: Secondary | ICD-10-CM | POA: Diagnosis not present

## 2018-09-25 ENCOUNTER — Other Ambulatory Visit: Payer: Self-pay | Admitting: Unknown Physician Specialty

## 2018-09-25 ENCOUNTER — Ambulatory Visit
Admission: RE | Admit: 2018-09-25 | Discharge: 2018-09-25 | Disposition: A | Discharge status: Home / Self Care | Payer: Medicare Other | Source: Ambulatory Visit | Attending: Family Medicine | Admitting: Family Medicine

## 2018-09-25 DIAGNOSIS — R42 Dizziness and giddiness: Secondary | ICD-10-CM | POA: Insufficient documentation

## 2018-09-25 DIAGNOSIS — H81399 Other peripheral vertigo, unspecified ear: Secondary | ICD-10-CM | POA: Diagnosis not present

## 2018-09-25 DIAGNOSIS — M25562 Pain in left knee: Secondary | ICD-10-CM

## 2018-10-02 ENCOUNTER — Other Ambulatory Visit: Payer: Self-pay | Admitting: Internal Medicine

## 2018-10-02 DIAGNOSIS — E7800 Pure hypercholesterolemia, unspecified: Secondary | ICD-10-CM

## 2018-10-02 DIAGNOSIS — E039 Hypothyroidism, unspecified: Secondary | ICD-10-CM

## 2018-10-02 DIAGNOSIS — R030 Elevated blood-pressure reading, without diagnosis of hypertension: Secondary | ICD-10-CM

## 2018-10-02 NOTE — Progress Notes (Signed)
tsh

## 2018-10-09 ENCOUNTER — Other Ambulatory Visit (INDEPENDENT_AMBULATORY_CARE_PROVIDER_SITE_OTHER): Payer: Medicare Other

## 2018-10-09 DIAGNOSIS — E039 Hypothyroidism, unspecified: Secondary | ICD-10-CM

## 2018-10-09 DIAGNOSIS — E7800 Pure hypercholesterolemia, unspecified: Secondary | ICD-10-CM | POA: Diagnosis not present

## 2018-10-09 DIAGNOSIS — R030 Elevated blood-pressure reading, without diagnosis of hypertension: Secondary | ICD-10-CM

## 2018-10-09 LAB — COMPREHENSIVE METABOLIC PANEL
ALBUMIN: 4 g/dL (ref 3.5–5.2)
ALK PHOS: 90 U/L (ref 39–117)
ALT: 16 U/L (ref 0–35)
AST: 19 U/L (ref 0–37)
BUN: 18 mg/dL (ref 6–23)
CO2: 29 mEq/L (ref 19–32)
CREATININE: 0.86 mg/dL (ref 0.40–1.20)
Calcium: 9 mg/dL (ref 8.4–10.5)
Chloride: 105 mEq/L (ref 96–112)
GFR: 64.4 mL/min (ref >60.00)
GLUCOSE: 84 mg/dL (ref 70–99)
Potassium: 4.1 mEq/L (ref 3.5–5.1)
SODIUM: 141 meq/L (ref 135–145)
TOTAL PROTEIN: 6.5 g/dL (ref 6.0–8.3)
Total Bilirubin: 0.5 mg/dL (ref 0.2–1.2)

## 2018-10-09 LAB — LIPID PANEL
Cholesterol: 209 mg/dL — ABNORMAL HIGH (ref 0–200)
HDL: 54.1 mg/dL (ref >39.00)
LDL CALC: 135 mg/dL — AB (ref 0–99)
NONHDL: 155
Total CHOL/HDL Ratio: 4
Triglycerides: 102 mg/dL (ref 0.0–149.0)
VLDL: 20.4 mg/dL (ref 0.0–40.0)

## 2018-10-09 LAB — TSH: TSH: 4.16 u[IU]/mL (ref 0.35–4.50)

## 2018-11-06 ENCOUNTER — Ambulatory Visit
Admission: RE | Admit: 2018-11-06 | Discharge: 2018-11-06 | Disposition: A | Discharge status: Home / Self Care | Payer: Medicare Other | Source: Ambulatory Visit | Attending: Unknown Physician Specialty | Admitting: Unknown Physician Specialty

## 2018-11-06 DIAGNOSIS — M23342 Other meniscus derangements, anterior horn of lateral meniscus, left knee: Secondary | ICD-10-CM | POA: Diagnosis not present

## 2018-11-06 DIAGNOSIS — M25562 Pain in left knee: Secondary | ICD-10-CM | POA: Insufficient documentation

## 2018-11-14 DIAGNOSIS — H66001 Acute suppurative otitis media without spontaneous rupture of ear drum, right ear: Secondary | ICD-10-CM | POA: Diagnosis not present

## 2018-11-14 DIAGNOSIS — R590 Localized enlarged lymph nodes: Secondary | ICD-10-CM | POA: Diagnosis not present

## 2018-11-22 DIAGNOSIS — J189 Pneumonia, unspecified organism: Secondary | ICD-10-CM | POA: Diagnosis not present

## 2018-11-24 ENCOUNTER — Ambulatory Visit: Payer: Self-pay

## 2018-11-24 NOTE — Telephone Encounter (Signed)
Pt called and states that [facility].  She has a cough and chills no fever.  They did a CXR and told her she had Pneumonia.  She was placed on Augmentin and Azithromycin. She states that her cough has improved and she feels better. She has noticed that she  is feeling hot and sweaty today.  She states that her temperature last night was 100.  She took tylenol and woke today sweaty.  She states that her Temperature today is 98.3. She has taken more tylenol. She feels hot and clammy. Pt denies rash, swelling, redness.  She is concerned that the symptoms have come from medications. Pt was reassured. Home care advice read to patient. Pt verbalized understanding of all instructions. Note will be routed to practice.  Reason for Disposition . [1] Fever AND [2] no signs of serious infection or localizing symptoms (all other triage questions negative)  Answer Assessment - Initial Assessment Questions 1. TEMPERATURE: "What is the most recent temperature?"  "How was it measured?"      98 .3  100 last night 2. ONSET: "When did the fever start?"     Possibly Sunday 3. SYMPTOMS: "Do you have any other symptoms besides the fever?"  (e.g., colds, headache, sore throat, earache, cough, rash, diarrhea, vomiting, abdominal pain)    Achymness but has gone now 4. CAUSE: If there are no symptoms, ask: "What do you think is causing the fever?"      Mild fever 5. CONTACTS: "Does anyone else in the family have an infection?"    No 6. TREATMENT: "What have you done so far to treat this fever?" (e.g., medications)     Tylenol 7. IMMUNOCOMPROMISE: "Do you have of the following: diabetes, HIV positive, splenectomy, cancer chemotherapy, chronic steroid treatment, transplant patient, etc."     No 8. PREGNANCY: "Is there any chance you are pregnant?" "When was your last menstrual period?"     No 9. TRAVEL: "Have you traveled out of the country in the last month?" (e.g., travel history,  exposures)    No  Protocols used: FEVER-A-AH

## 2018-11-24 NOTE — Telephone Encounter (Signed)
triage note reviewed.  There is nothing else to do for patient except reassure her and tell her that she should self quarantine.  There are several flaws with the questionairre the [facility] and triage nurses are using:  1) Since COVID 19 is  Now in our communities,  It is no longer ENOUGH to ask about travel. If people have gone to church,  They could be infected. PEOPLE NEED TO BE TOLD TO  STAY HOME FROM [place]  2) WHEN DISCUSSING TEMPERATURES WITH PATIENTS WE MUST INQUIRE ABOUT USE OF TYLENOL  MOTRIN ETC (iE  USE OF antipyretics) with in the last 8 hours or the information is worthless   PLEASE DISTRIBUTE TO ALL STAFF ANSWERING PHONES

## 2018-11-24 NOTE — Telephone Encounter (Signed)
Spoke with pt to let her know that Dr. Darrick Huntsman stated that there is nothing more that she can do for her except to reassure her and make sure she self quarantines. The pt stated that she is staying at home and will finish her antibiotics and hopefully start feeling better. The pt was advised that if her symptoms worsened or she began having trouble breathing to please seek medical attention at either urgent care or the emergency room. Pt gave a verbal understanding.

## 2018-12-02 ENCOUNTER — Other Ambulatory Visit: Payer: Self-pay

## 2018-12-02 ENCOUNTER — Encounter: Payer: Self-pay | Admitting: Internal Medicine

## 2018-12-02 ENCOUNTER — Ambulatory Visit (INDEPENDENT_AMBULATORY_CARE_PROVIDER_SITE_OTHER): Payer: Medicare Other | Admitting: Internal Medicine

## 2018-12-02 VITALS — BP 152/84 | HR 69 | Temp 98.5°F | Resp 15 | Ht 61.0 in | Wt 137.8 lb

## 2018-12-02 DIAGNOSIS — Z0001 Encounter for general adult medical examination with abnormal findings: Secondary | ICD-10-CM | POA: Diagnosis not present

## 2018-12-02 DIAGNOSIS — R030 Elevated blood-pressure reading, without diagnosis of hypertension: Secondary | ICD-10-CM | POA: Diagnosis not present

## 2018-12-02 DIAGNOSIS — H6500 Acute serous otitis media, unspecified ear: Secondary | ICD-10-CM | POA: Diagnosis not present

## 2018-12-02 DIAGNOSIS — J1100 Influenza due to unidentified influenza virus with unspecified type of pneumonia: Secondary | ICD-10-CM | POA: Diagnosis not present

## 2018-12-02 DIAGNOSIS — J181 Lobar pneumonia, unspecified organism: Secondary | ICD-10-CM

## 2018-12-02 DIAGNOSIS — E039 Hypothyroidism, unspecified: Secondary | ICD-10-CM

## 2018-12-02 DIAGNOSIS — E7800 Pure hypercholesterolemia, unspecified: Secondary | ICD-10-CM

## 2018-12-02 DIAGNOSIS — H6690 Otitis media, unspecified, unspecified ear: Secondary | ICD-10-CM | POA: Insufficient documentation

## 2018-12-02 DIAGNOSIS — J189 Pneumonia, unspecified organism: Secondary | ICD-10-CM

## 2018-12-02 MED ORDER — FLUCONAZOLE 150 MG PO TABS: 150.0000 mg | ORAL_TABLET | Freq: Every day | ORAL | 0 refills | Status: DC

## 2018-12-02 NOTE — Patient Instructions (Addendum)
Go ahead and take the prednisone to help clear up the fluid behind your right eardrum  Add a daily claritin for seasonal allergies  Y ou can also add a daily irrigation system called [employer]'s Sinus rinse ;  It is a strong sinus "flush" using water and medicated salts.  Do it over the sink because it can be a bit messy  Please take a probiotic ( Align, Floraque or Culturelle), the generic version of one of these over the counter medications, or an alternative form (kombucha,  Yogurt, or another dietary source) for a minimum of 3 weeks to prevent a serious antibiotic associated diarrhea  Called clostridium dificile colitis.  Taking a probiotic may also prevent vaginitis due to yeast infections and can be continued indefinitely if you feel that it improves your digestion or your elimination (bowels).    Fluconazole sent to pharmacy for the yeast infection   We need to Repeat chest x ray mid April to make sure the right base is back to looking normal.   Please check your blood pressure a few times at home and send me the readings so I can determine if you need a change in medication    Health Maintenance for Postmenopausal Women Menopause is a normal process in which your reproductive ability comes to an end. This process happens gradually over a span of months to years, usually between the ages of 80 and 2. Menopause is complete when you have missed 12 consecutive menstrual periods. It is important to talk with your health care provider about some of the most common conditions that affect postmenopausal women, such as heart disease, cancer, and bone loss (osteoporosis). Adopting a healthy lifestyle and getting preventive care can help to promote your health and wellness. Those actions can also lower your chances of developing some of these common conditions. What should I know about menopause? During menopause, you may experience a number of symptoms, such as:  Moderate-to-severe hot  flashes.  Night sweats.  Decrease in sex drive.  Mood swings.  Headaches.  Tiredness.  Irritability.  Memory problems.  Insomnia. Choosing to treat or not to treat menopausal changes is an individual decision that you make with your health care provider. What should I know about hormone replacement therapy and supplements? Hormone therapy products are effective for treating symptoms that are associated with menopause, such as hot flashes and night sweats. Hormone replacement carries certain risks, especially as you become older. If you are thinking about using estrogen or estrogen with progestin treatments, discuss the benefits and risks with your health care provider. What should I know about heart disease and stroke? Heart disease, heart attack, and stroke become more likely as you age. This may be due, in part, to the hormonal changes that your body experiences during menopause. These can affect how your body processes dietary fats, triglycerides, and cholesterol. Heart attack and stroke are both medical emergencies. There are many things that you can do to help prevent heart disease and stroke:  Have your blood pressure checked at least every 1-2 years. High blood pressure causes heart disease and increases the risk of stroke.  If you are 37-58 years old, ask your health care provider if you should take aspirin to prevent a heart attack or a stroke.  Do not use any tobacco products, including cigarettes, chewing tobacco, or electronic cigarettes. If you need help quitting, ask your health care provider.  It is important to eat a healthy diet and maintain a healthy  weight. ? Be sure to include plenty of vegetables, fruits, low-fat dairy products, and lean protein. ? Avoid eating foods that are high in solid fats, added sugars, or salt (sodium).  Get regular exercise. This is one of the most important things that you can do for your health. ? Try to exercise for at least 150  minutes each week. The type of exercise that you do should increase your heart rate and make you sweat. This is known as moderate-intensity exercise. ? Try to do strengthening exercises at least twice each week. Do these in addition to the moderate-intensity exercise.  Know your numbers.Ask your health care provider to check your cholesterol and your blood glucose. Continue to have your blood tested as directed by your health care provider.  What should I know about cancer screening? There are several types of cancer. Take the following steps to reduce your risk and to catch any cancer development as early as possible. Breast Cancer  Practice breast self-awareness. ? This means understanding how your breasts normally appear and feel. ? It also means doing regular breast self-exams. Let your health care provider know about any changes, no matter how small.  If you are 87 or older, have a clinician do a breast exam (clinical breast exam or CBE) every year. Depending on your age, family history, and medical history, it may be recommended that you also have a yearly breast X-ray (mammogram).  If you have a family history of breast cancer, talk with your health care provider about genetic screening.  If you are at high risk for breast cancer, talk with your health care provider about having an MRI and a mammogram every year.  Breast cancer (BRCA) gene test is recommended for women who have family members with BRCA-related cancers. Results of the assessment will determine the need for genetic counseling and BRCA1 and for BRCA2 testing. BRCA-related cancers include these types: ? Breast. This occurs in males or females. ? Ovarian. ? Tubal. This may also be called fallopian tube cancer. ? Cancer of the abdominal or pelvic lining (peritoneal cancer). ? Prostate. ? Pancreatic. Cervical, Uterine, and Ovarian Cancer Your health care provider may recommend that you be screened regularly for cancer of  the pelvic organs. These include your ovaries, uterus, and vagina. This screening involves a pelvic exam, which includes checking for microscopic changes to the surface of your cervix (Pap test).  For women ages 21-65, health care providers may recommend a pelvic exam and a Pap test every three years. For women ages 35-65, they may recommend the Pap test and pelvic exam, combined with testing for human papilloma virus (HPV), every five years. Some types of HPV increase your risk of cervical cancer. Testing for HPV may also be done on women of any age who have unclear Pap test results.  Other health care providers may not recommend any screening for nonpregnant women who are considered low risk for pelvic cancer and have no symptoms. Ask your health care provider if a screening pelvic exam is right for you.  If you have had past treatment for cervical cancer or a condition that could lead to cancer, you need Pap tests and screening for cancer for at least 20 years after your treatment. If Pap tests have been discontinued for you, your risk factors (such as having a new sexual partner) need to be reassessed to determine if you should start having screenings again. Some women have medical problems that increase the chance of getting cervical  cancer. In these cases, your health care provider may recommend that you have screening and Pap tests more often.  If you have a family history of uterine cancer or ovarian cancer, talk with your health care provider about genetic screening.  If you have vaginal bleeding after reaching menopause, tell your health care provider.  There are currently no reliable tests available to screen for ovarian cancer. Lung Cancer Lung cancer screening is recommended for adults 59-34 years old who are at high risk for lung cancer because of a history of smoking. A yearly low-dose CT scan of the lungs is recommended if you:  Currently smoke.  Have a history of at least 30  pack-years of smoking and you currently smoke or have quit within the past 15 years. A pack-year is smoking an average of one pack of cigarettes per day for one year. Yearly screening should:  Continue until it has been 15 years since you quit.  Stop if you develop a health problem that would prevent you from having lung cancer treatment. Colorectal Cancer  This type of cancer can be detected and can often be prevented.  Routine colorectal cancer screening usually begins at age 21 and continues through age 76.  If you have risk factors for colon cancer, your health care provider may recommend that you be screened at an earlier age.  If you have a family history of colorectal cancer, talk with your health care provider about genetic screening.  Your health care provider may also recommend using home test kits to check for hidden blood in your stool.  A small camera at the end of a tube can be used to examine your colon directly (sigmoidoscopy or colonoscopy). This is done to check for the earliest forms of colorectal cancer.  Direct examination of the colon should be repeated every 5-10 years until age 101. However, if early forms of precancerous polyps or small growths are found or if you have a family history or genetic risk for colorectal cancer, you may need to be screened more often. Skin Cancer  Check your skin from head to toe regularly.  Monitor any moles. Be sure to tell your health care provider: ? About any new moles or changes in moles, especially if there is a change in a mole's shape or color. ? If you have a mole that is larger than the size of a pencil eraser.  If any of your family members has a history of skin cancer, especially at a young age, talk with your health care provider about genetic screening.  Always use sunscreen. Apply sunscreen liberally and repeatedly throughout the day.  Whenever you are outside, protect yourself by wearing long sleeves, pants, a  wide-brimmed hat, and sunglasses. What should I know about osteoporosis? Osteoporosis is a condition in which bone destruction happens more quickly than new bone creation. After menopause, you may be at an increased risk for osteoporosis. To help prevent osteoporosis or the bone fractures that can happen because of osteoporosis, the following is recommended:  If you are 77-31 years old, get at least 1,000 mg of calcium and at least 600 mg of vitamin D per day.  If you are older than age 75 but younger than age 86, get at least 1,200 mg of calcium and at least 600 mg of vitamin D per day.  If you are older than age 37, get at least 1,200 mg of calcium and at least 800 mg of vitamin D per day. Smoking  and excessive alcohol intake increase the risk of osteoporosis. Eat foods that are rich in calcium and vitamin D, and do weight-bearing exercises several times each week as directed by your health care provider. What should I know about how menopause affects my mental health? Depression may occur at any age, but it is more common as you become older. Common symptoms of depression include:  Low or sad mood.  Changes in sleep patterns.  Changes in appetite or eating patterns.  Feeling an overall lack of motivation or enjoyment of activities that you previously enjoyed.  Frequent crying spells. Talk with your health care provider if you think that you are experiencing depression. What should I know about immunizations? It is important that you get and maintain your immunizations. These include:  Tetanus, diphtheria, and pertussis (Tdap) booster vaccine.  Influenza every year before the flu season begins.  Pneumonia vaccine.  Shingles vaccine. Your health care provider may also recommend other immunizations. This information is not intended to replace advice given to you by your health care provider. Make sure you discuss any questions you have with your health care provider. Document  Released: 10/11/2005 Document Revised: 03/08/2016 Document Reviewed: 05/23/2015 Elsevier Interactive Patient Education  2019 [employer].

## 2018-12-02 NOTE — Assessment & Plan Note (Signed)

## 2018-12-02 NOTE — Assessment & Plan Note (Signed)
Treated empirically with antibiotics.  She has improved clinically but continues to have coarse rales at right base and is not hypoxic. Marland Kitchen  Repeat chest x ray recommended in 2-3 weeks

## 2018-12-02 NOTE — Assessment & Plan Note (Signed)
Using the Framingham risk calculator,  her 10 year risk of coronary artery disease  Was calculated  And elevated.   Statin therapy has been advised and repeatedly deferred    Lab Results  Component Value Date   CHOL 209 (H) 10/09/2018   HDL 54.10 10/09/2018   LDLCALC 135 (H) 10/09/2018   LDLDIRECT 141.0 09/18/2017   TRIG 102.0 10/09/2018   CHOLHDL 4 10/09/2018

## 2018-12-02 NOTE — Assessment & Plan Note (Signed)
.  home readings have been checked the distant past and have been  normal .  no medications prescribed today , advised to check BP at home daily for 5 days and submit readings for evaluation   Lab Results  Component Value Date   CREATININE 0.86 10/09/2018   Lab Results  Component Value Date   NA 141 10/09/2018   K 4.1 10/09/2018   CL 105 10/09/2018   CO2 29 10/09/2018

## 2018-12-02 NOTE — Assessment & Plan Note (Signed)
Right ear treated with augmentin March 14, she did not take the prednisone and symptoms are returning  She has no effusion on exam.  Advised her to take the prednisone now for inflammation

## 2018-12-02 NOTE — Progress Notes (Signed)
Patient ID: [name], female    DOB: 11-12-1945  Age: 73 y.o. MRN: [id number]  The patient is here for annual preventive  examination and management of other chronic and acute problems.   The risk factors are reflected in the social history.  The roster of all physicians providing medical care to patient - is listed in the Snapshot section of the chart.  Activities of daily living:  The patient is 100% independent in all ADLs: dressing, toileting, feeding as well as independent mobility  Home safety : The patient has smoke detectors in the home. They wear seatbelts.  There are no firearms at home. There is no violence in the home.   There is no risks for hepatitis, STDs or HIV. There is no   history of blood transfusion. They have no travel history to infectious disease endemic areas of the world.  The patient has seen their dentist in the last six month. They have seen their eye doctor in the last year. They admit to slight hearing difficulty with regard to whispered voices and some television programs.  They have deferred audiologic testing in the last year.  They do not  have excessive sun exposure. Discussed the need for sun protection: hats, long sleeves and use of sunscreen if there is significant sun exposure.   Diet: the importance of a healthy diet is discussed. They do have a healthy diet.  The benefits of regular aerobic exercise were discussed. She walks 4 times per week ,  20 minutes.   Depression screen: there are no signs or vegative symptoms of depression- irritability, change in appetite, anhedonia, sadness/tearfullness.  Cognitive assessment: the patient manages all their financial and personal affairs and is actively engaged. They could relate day,date,year and events; recalled 2/3 objects at 3 minutes; performed clock-face test normally.  The following portions of the patient's history were reviewed and updated as appropriate: allergies, current medications, past  family history, past medical history,  past surgical history, past social history  and problem list.  Visual acuity was not assessed per patient preference since she has regular follow up with her ophthalmologist. Hearing and body mass index were assessed and reviewed.   During the course of the visit the patient was educated and counseled about appropriate screening and preventive services including : fall prevention , diabetes screening, nutrition counseling, colorectal cancer screening, and recommended immunizations.    CC: The primary encounter diagnosis was Encounter for general adult medical examination with abnormal findings. Diagnoses of Atypical pneumonia, Pneumonia of right lower lobe due to influenza A virus, Acute serous otitis media, recurrence not specified, unspecified laterality, White coat syndrome without diagnosis of hypertension, Acquired hypothyroidism, and Pure hypercholesterolemia were also pertinent to this visit.  1) Lingering cough:  Right lung  Base  ATX noted on chest x ray done [facility] 22 after developing worsening cough despite  Being on augmentin for otitis media diagnosed March 14 by [facility].   HPI::  Treated March 14 by [facility] for right cervical LAD and Otitis media on the right .  Also had a mild cough,  No fevers,  Symptoms began after  a dental procedure .  Right jaw pain,  Then ear started tingling and feeling full, worse with swallowing,  Prescribed augmentin and prednisone 20 mg x 5 .  Did not take the prednisone .   Cough became worse despite taking augmentin went to [facility] on march 22,  Chest x ray  done and Right LL ATX noted .  Tested weakly positive for influenza .  treated for pneumonia with Augmentin XR and azithromycin ,  She was  "out of the window"  for Tamiflu.  Marland Kitchen Spiked a fever of 101 during this treatment, on or around March 24, described it as a drenching night sweat:  Pajamas,head and neck were drenched, took tylenol.   Denies any subsequent  fevers,  But has been taking tylenol for headache.    Her last dose was yesterday.    Has not had any shortness of breath or body aches.  Sputum has been clear. The cough has improved but has nasal congestion,  clearing throat a lot  Ear starting to tingle again .  Has had  anorexia, had a loose stool yesterday   White coat Hypertension :  Has not checked home readings in a while,  Last 3 clinic readings elevated.    History Machell has a past medical history of Arthritis, Chicken pox, Fatigue, Hair loss, Heart murmur, Heartburn, Hyperlipidemia, Migraines, and Peptic ulcer.   She has a past surgical history that includes Cholecystectomy; Breast surgery; Shoulder open rotator cuff repair (Right, 03/31/2013); Tonsillectomy and adenoidectomy; Dilation and curettage of uterus; and Breast excisional biopsy (Left, 2005-2010).   Her family history includes Arthritis in her mother; Breast cancer (age of onset: 15) in her mother; Chronic fatigue in her mother; Diabetes in her father and paternal grandfather.She reports that she has never smoked. She has never used smokeless tobacco. She reports current alcohol use of about 1.0 standard drinks of alcohol per week. She reports that she does not use drugs.  Outpatient Medications Prior to Visit  Medication Sig Dispense Refill  . cholecalciferol (VITAMIN D) 1000 units tablet Take 1,000 Units by mouth daily.    . Multiple Vitamins-Minerals (MULTIVITAMIN PO) Take by mouth daily.    Marland Kitchen ALPRAZolam (XANAX) 0.25 MG tablet Take 1 tablet (0.25 mg total) by mouth daily as needed. for anxiety (Patient not taking: Reported on 12/02/2018) 30 tablet 0  . busPIRone (BUSPAR) 7.5 MG tablet Take 1 tablet (7.5 mg total) by mouth 3 (three) times daily. (Patient not taking: Reported on 12/02/2018) 90 tablet 3  . gabapentin (NEURONTIN) 100 MG capsule Take 1 capsule (100 mg total) by mouth 3 (three) times daily. (Patient not taking: Reported on 12/02/2018) 90 capsule 3   . meclizine (ANTIVERT) 12.5 MG tablet Take 1 tablet (12.5 mg total) by mouth 3 (three) times daily as needed for dizziness. Can cause drowsiness. (Patient not taking: Reported on 12/02/2018) 30 tablet 1  . omeprazole (PRILOSEC) 40 MG capsule Take 1 capsule (40 mg total) by mouth daily. 30 capsule 0  . fluticasone (FLONASE) 50 MCG/ACT nasal spray Place into the nose.    . levothyroxine (SYNTHROID, LEVOTHROID) 25 MCG tablet Take 1 tablet (25 mcg total) by mouth daily. (Patient not taking: Reported on 12/02/2018) 90 tablet 0   No facility-administered medications prior to visit.     Review of Systems   Patient denies , fevers, malaise, unintentional weight loss, skin rash, eye pain,  sinus pain, sore throat, dysphagia,  hemoptysis , , abdominal pain, nausea, melena, , constipation, flank pain, dysuria, hematuria, urinary  Frequency, nocturia, numbness, tingling, seizures,  Focal weakness, Loss of consciousness,  Tremor, insomnia, depression, anxiety, and suicidal ideation.      Objective:  BP (!) 152/84 (BP Location: Left Arm, Patient Position: Sitting, Cuff Size: Normal)   Pulse 69   Temp 98.5 F (36.9  C) (Oral)   Resp 15   Ht 5\' 1"  (1.549 m)   Wt 137 lb 12.8 oz (62.5 kg)   SpO2 98%   BMI 26.04 kg/m   Physical Exam   General appearance: alert, cooperative and appears stated age Head: Normocephalic, without obvious abnormality, atraumatic Eyes: conjunctivae/corneas clear. PERRL, EOM's intact. Fundi benign. Ears: right EM with mild erythema, no effusion normal , left TM and normal external ear canals both ears Nose: Nares normal. Septum midline. Mucosa normal. No drainage or sinus tenderness. Throat: lips, mucosa, and tongue normal; teeth and gums normal Neck: no adenopathy, no carotid bruit, no JVD, supple, symmetrical, trachea midline and thyroid not enlarged, symmetric, no tenderness/mass/nodules Lungs: clear to auscultation on left and on right anteriorly,  Coarse crackles at right  base,  No egophony  Breasts: normal appearance, no masses or tenderness Heart: regular rate and rhythm, S1, S2 normal, no murmur, click, rub or gallop Abdomen: soft, non-tender; bowel sounds normal; no masses,  no organomegaly Extremities: extremities normal, atraumatic, no cyanosis or edema Pulses: 2+ and symmetric Skin: Skin color, texture, turgor normal. No rashes or lesions Neurologic: Alert and oriented X 3, normal strength and tone. Normal symmetric reflexes. Normal coordination and gait.      Assessment & Plan:   Problem List Items Addressed This Visit    Acquired hypothyroidism    TSh has normalized without medication   Lab Results  Component Value Date   TSH 4.16 10/09/2018         Pure hypercholesterolemia    Using the Framingham risk calculator,  her 10 year risk of coronary artery disease  Was calculated  And elevated.   Statin therapy has been advised and repeatedly deferred    Lab Results  Component Value Date   CHOL 209 (H) 10/09/2018   HDL 54.10 10/09/2018   LDLCALC 135 (H) 10/09/2018   LDLDIRECT 141.0 09/18/2017   TRIG 102.0 10/09/2018   CHOLHDL 4 10/09/2018         Encounter for general adult medical examination with abnormal findings - Primary    age appropriate education and counseling updated, referrals for preventative services and immunizations addressed, dietary and smoking counseling addressed, most recent labs reviewed.  I have personally reviewed and have noted:  1) the patient's medical and social history 2) The pt's use of alcohol, tobacco, and illicit drugs 3) The patient's current medications and supplements 4) Functional ability including ADL's, fall risk, home safety risk, hearing and visual impairment 5) Diet and physical activities 6) Evidence for depression or mood disorder 7) The patient's height, weight, and BMI have been recorded in the chart  I have made referrals, and provided counseling and education based on review of the  above      White coat syndrome without diagnosis of hypertension    .home readings have been checked the distant past and have been  normal .  no medications prescribed today , advised to check BP at home daily for 5 days and submit readings for evaluation   Lab Results  Component Value Date   CREATININE 0.86 10/09/2018   Lab Results  Component Value Date   NA 141 10/09/2018   K 4.1 10/09/2018   CL 105 10/09/2018   CO2 29 10/09/2018        RLL pneumonia (HCC)    Treated empirically with antibiotics.  She has improved clinically but continues to have coarse rales at right base and is not hypoxic. Marland Kitchen  Repeat chest  x ray recommended in 2-3 weeks       Relevant Medications   fluconazole (DIFLUCAN) 150 MG tablet   Otitis media    Right ear treated with augmentin March 14, she did not take the prednisone and symptoms are returning  She has no effusion on exam.  Advised her to take the prednisone now for inflammation       Relevant Medications   fluconazole (DIFLUCAN) 150 MG tablet    Other Visit Diagnoses    Atypical pneumonia       Relevant Medications   fluconazole (DIFLUCAN) 150 MG tablet   Other Relevant Orders   DG Chest 2 View      I have discontinued Haille A. Montee's fluticasone and levothyroxine. I am also having her start on fluconazole. Additionally, I am having her maintain her Multiple Vitamins-Minerals (MULTIVITAMIN PO), cholecalciferol, ALPRAZolam, busPIRone, omeprazole, gabapentin, and meclizine.  Meds ordered this encounter  Medications  . fluconazole (DIFLUCAN) 150 MG tablet    Sig: Take 1 tablet (150 mg total) by mouth daily.    Dispense:  2 tablet    Refill:  0    Medications Discontinued During This Encounter  Medication Reason  . fluticasone (FLONASE) 50 MCG/ACT nasal spray Patient has not taken in last 30 days  . levothyroxine (SYNTHROID, LEVOTHROID) 25 MCG tablet     Follow-up: No follow-ups on file.   Sherlene Shams, MD

## 2018-12-02 NOTE — Assessment & Plan Note (Addendum)
TSh has normalized without medication   Lab Results  Component Value Date   TSH 4.16 10/09/2018

## 2018-12-03 ENCOUNTER — Telehealth: Payer: Self-pay | Admitting: Internal Medicine

## 2018-12-03 MED ORDER — PREDNISONE 10 MG PO TABS: ORAL_TABLET | ORAL | 0 refills | Status: DC

## 2018-12-03 NOTE — Telephone Encounter (Signed)
Pt aware.

## 2018-12-03 NOTE — Telephone Encounter (Signed)
Copied from CRM [id number]. Topic: Quick Communication - Rx Refill/Question >> Dec 03, 2018  9:55 AM Louie Bun, Rosey Bath D wrote: Medication: predisone  Has the patient contacted their pharmacy? no (Agent: If no, request that the patient contact the pharmacy for the refill.) (Agent: If yes, when and what did the pharmacy advise?)  Preferred Pharmacy (with phone number or street name): [id number] - [place], [place] - [address]  Agent: Please be advised that RX refills may take up to 3 business days. We ask that you follow-up with your pharmacy.

## 2018-12-03 NOTE — Telephone Encounter (Signed)
Prednisone sent

## 2018-12-09 DIAGNOSIS — K219 Gastro-esophageal reflux disease without esophagitis: Secondary | ICD-10-CM | POA: Diagnosis not present

## 2018-12-23 ENCOUNTER — Other Ambulatory Visit: Payer: Self-pay

## 2018-12-23 ENCOUNTER — Ambulatory Visit (INDEPENDENT_AMBULATORY_CARE_PROVIDER_SITE_OTHER): Payer: Medicare Other

## 2018-12-23 ENCOUNTER — Other Ambulatory Visit: Payer: Medicare Other

## 2018-12-23 DIAGNOSIS — J189 Pneumonia, unspecified organism: Secondary | ICD-10-CM

## 2018-12-23 DIAGNOSIS — R918 Other nonspecific abnormal finding of lung field: Secondary | ICD-10-CM | POA: Diagnosis not present

## 2018-12-24 DIAGNOSIS — E039 Hypothyroidism, unspecified: Secondary | ICD-10-CM | POA: Diagnosis not present

## 2018-12-25 ENCOUNTER — Other Ambulatory Visit: Payer: Self-pay | Admitting: Internal Medicine

## 2018-12-25 DIAGNOSIS — R0600 Dyspnea, unspecified: Secondary | ICD-10-CM

## 2018-12-25 DIAGNOSIS — R0689 Other abnormalities of breathing: Principal | ICD-10-CM

## 2018-12-29 ENCOUNTER — Encounter: Payer: Self-pay | Admitting: Pulmonary Disease

## 2018-12-29 ENCOUNTER — Ambulatory Visit: Payer: Medicare Other | Admitting: Pulmonary Disease

## 2018-12-29 ENCOUNTER — Other Ambulatory Visit: Payer: Self-pay

## 2018-12-29 VITALS — BP 148/82 | HR 67 | Ht 61.0 in | Wt 136.4 lb

## 2018-12-29 DIAGNOSIS — R0602 Shortness of breath: Secondary | ICD-10-CM

## 2018-12-29 DIAGNOSIS — F419 Anxiety disorder, unspecified: Secondary | ICD-10-CM

## 2018-12-29 DIAGNOSIS — J302 Other seasonal allergic rhinitis: Secondary | ICD-10-CM

## 2018-12-29 DIAGNOSIS — K219 Gastro-esophageal reflux disease without esophagitis: Secondary | ICD-10-CM

## 2018-12-29 DIAGNOSIS — R05 Cough: Secondary | ICD-10-CM | POA: Diagnosis not present

## 2018-12-29 DIAGNOSIS — R0989 Other specified symptoms and signs involving the circulatory and respiratory systems: Secondary | ICD-10-CM | POA: Diagnosis not present

## 2018-12-29 DIAGNOSIS — R059 Cough, unspecified: Secondary | ICD-10-CM

## 2018-12-29 MED ORDER — PANTOPRAZOLE SODIUM 40 MG PO TBEC: 40.0000 mg | DELAYED_RELEASE_TABLET | Freq: Every day | ORAL | 2 refills | Status: DC

## 2018-12-29 NOTE — Progress Notes (Signed)
   Subjective:    Patient ID: Kimberly Rojas, female    DOB: February 17, 1946, 73 y.o.   MRN: [id number]  HPI    Error duplicate template ... see not under Pulmonology Progress note    Review of Systems     Objective:   Physical Exam        Assessment & Plan:

## 2018-12-29 NOTE — Patient Instructions (Addendum)
Order CT of your chest.  Add Protonix 40mg  daily. Follow up in our office in 2 months.

## 2018-12-29 NOTE — Progress Notes (Signed)
Subjective:    Patient ID: Kimberly Rojas, female    DOB: [birth year], 73 y.o.   MRN: [id number]  HPI Patient is a 73 year old Never smoker, who presents for evaluation of a cough that has been intermittent for 2 months duration.  She is kindly referred by Dr. Duncan Dulleresa Tullo.  The patient states that issue started when she was diagnosed with "pneumonia" this apparently was 2 to 3 months ago.  Subsequently she developed a cough that has been "on and off" and is worse when she is outside or after eating.  Cough is for the most part nonproductive.  She has not had any hemoptysis.  She has issues with significant gastroesophageal reflux to the point that she will have volume reflux at nighttime.  Recently she was restarted on omeprazole for this issue however she notes that omeprazole paradoxically makes her cough worse though it does help her reflux.  She has not found anything that makes her cough better.  The patient states that she has also had episodic shortness of breath that has no particular precipitating factor she notes that these will be short-lived and usually occur in the mornings.  They are not associated with chest pain, diaphoresis, arm pain or any other discomfort.  She notes that what makes these episodes better is taking some Xanax.  She does admit to being quite anxious particularly since the death of her husband.  The patient has been diagnosed with pneumonia approximately 3 months ago and treated with Augmentin and prednisone.  Initially she got better but then the cough noted above ensued.  She had a chest x-ray on 22 April that shows no active infiltrate.  Other issues that she describes are postnasal drip, need to clear her throat constantly and occasional hoarseness associated with this.  Patient is not on ACE inhibitors or ARB's.  Past medical history, family history, and social history has been reviewed and are as noted.  The patient has no past occupational exposure.    Review of Systems  Constitutional: Negative.   HENT: Positive for congestion, postnasal drip and voice change.   Eyes: Negative.   Respiratory: Positive for cough and shortness of breath.   Cardiovascular: Positive for palpitations.  Gastrointestinal:       + reflux  Endocrine: Negative.   Genitourinary: Negative.   Musculoskeletal: Negative.   Skin: Negative.   Allergic/Immunologic: Negative.   Neurological: Negative.   Hematological: Negative.   Psychiatric/Behavioral:       Anxiety  All other systems reviewed and are negative.      Objective:   Physical Exam Vitals signs and nursing note reviewed.  Constitutional:      General: She is not in acute distress.    Appearance: Normal appearance. She is normal weight. She is not ill-appearing.  HENT:     Head: Normocephalic and atraumatic.     Right Ear: Tympanic membrane, ear canal and external ear normal.     Left Ear: Tympanic membrane, ear canal and external ear normal.     Nose: Mucosal edema present. No septal deviation.     Right Turbinates: Swollen.     Left Turbinates: Swollen.     Mouth/Throat:     Mouth: Mucous membranes are moist.     Pharynx: Oropharynx is clear.  Eyes:     General: No scleral icterus.    Conjunctiva/sclera: Conjunctivae normal.     Pupils: Pupils are equal, round, and reactive to light.  Neck:  Musculoskeletal: Neck supple. No muscular tenderness.     Thyroid: No thyromegaly.     Trachea: Trachea and phonation normal.  Cardiovascular:     Rate and Rhythm: Normal rate and regular rhythm.     Pulses: Normal pulses.     Heart sounds: Normal heart sounds.  Pulmonary:     Effort: Pulmonary effort is normal. No respiratory distress.     Breath sounds: Normal air entry. Examination of the right-lower field reveals rales. Rales present. No decreased breath sounds, wheezing or rhonchi.  Abdominal:     General: There is no distension.  Musculoskeletal:     Comments: OA changes both hands   Lymphadenopathy:     Cervical: No cervical adenopathy.  Skin:    General: Skin is warm and dry.  Neurological:     General: No focal deficit present.     Mental Status: She is alert and oriented to person, place, and time.     Motor: Motor function is intact.     Coordination: Coordination is intact.     Gait: Gait is intact.  Psychiatric:        Mood and Affect: Affect normal. Mood is anxious.           Assessment & Plan:  1. Cough: She appears to have had issues with postinfectious cough aggravated by gastroesophageal reflux and laryngopharyngeal reflux.  Though omeprazole helps her gastroesophageal reflux she believes that it does make her cough worse.  We have stressed antireflux measures.  We will try to address gastroesophageal reflux as below.  This may be an aggravating factor for her current symptoms.  Particularly for her postprandial cough.  Other factors affecting this could be postnasal drip syndrome (upper airway cough syndrome).  She does not have bronchospasm on exam.  As her cough has been persistent will obtain chest CT without contrast for further evaluation.  2. Episodic shortness of breath: This symptom is limited to the mornings only.  She states that response to anxiolytics.  Suspect that this is due to her underlying anxiety issues.  3. GERD with LPR: This issue is adding complexity to her management.  Suspect that some of her issues are related to laryngopharyngeal reflux associated with GERD.  She has intolerance to omeprazole as she believes that this makes her cough worse.  We will give her a trial of pantoprazole to see if this helps with her reflux issues.  Again, antireflux measures were stressed.  4: Abnormal lung sounds RLL: Patient has dry crackles on the right base likely secondary to fibrotic changes.  These could result from chronic silent aspiration due to GERD.  Chest CT should help clear the etiology of these abnormal lung sounds.   5. Allergic  Rhinitis: She was instructed on nasal hygiene.  Also recommended that she take either Claritin or Allegra daily.  Postnasal drip can trigger upper airway cough syndrome.   6. Anxiety: This issue adds complexity to her management.  She states that she has done well in the past with counseling.  She has been referred by her primary care physician to reestablish this.  See the patient in follow-up in 3 months time.  We will notify her of the results of her chest CT when available.

## 2018-12-31 ENCOUNTER — Other Ambulatory Visit: Payer: Self-pay

## 2018-12-31 ENCOUNTER — Ambulatory Visit
Admission: RE | Admit: 2018-12-31 | Discharge: 2018-12-31 | Disposition: A | Discharge status: Home / Self Care | Payer: Medicare Other | Source: Ambulatory Visit | Attending: Pulmonary Disease | Admitting: Pulmonary Disease

## 2018-12-31 DIAGNOSIS — E041 Nontoxic single thyroid nodule: Secondary | ICD-10-CM | POA: Diagnosis not present

## 2018-12-31 DIAGNOSIS — R7989 Other specified abnormal findings of blood chemistry: Secondary | ICD-10-CM | POA: Diagnosis not present

## 2018-12-31 DIAGNOSIS — R05 Cough: Secondary | ICD-10-CM | POA: Diagnosis not present

## 2018-12-31 DIAGNOSIS — R059 Cough, unspecified: Secondary | ICD-10-CM

## 2018-12-31 DIAGNOSIS — Z09 Encounter for follow-up examination after completed treatment for conditions other than malignant neoplasm: Secondary | ICD-10-CM | POA: Diagnosis not present

## 2018-12-31 DIAGNOSIS — J189 Pneumonia, unspecified organism: Secondary | ICD-10-CM | POA: Diagnosis not present

## 2019-01-05 DIAGNOSIS — W010XXA Fall on same level from slipping, tripping and stumbling without subsequent striking against object, initial encounter: Secondary | ICD-10-CM | POA: Diagnosis not present

## 2019-01-05 DIAGNOSIS — M25462 Effusion, left knee: Secondary | ICD-10-CM | POA: Diagnosis not present

## 2019-01-05 DIAGNOSIS — S83272A Complex tear of lateral meniscus, current injury, left knee, initial encounter: Secondary | ICD-10-CM | POA: Diagnosis not present

## 2019-01-05 DIAGNOSIS — M1712 Unilateral primary osteoarthritis, left knee: Secondary | ICD-10-CM | POA: Diagnosis not present

## 2019-01-06 ENCOUNTER — Telehealth: Payer: Self-pay

## 2019-01-06 NOTE — Telephone Encounter (Signed)
-----   Message from Salena Saner, MD sent at 01/06/2019  8:15 AM EDT ----- Some mild scarring. Nothing of major concern.Marland Kitchen

## 2019-01-06 NOTE — Telephone Encounter (Signed)
Spoke to patient about her CT scan. She is aware.  States she still has some cough, noted that when she goes outside she sneezes, suggested patient try allergy medication.  She will notify us if it does not resolve.

## 2019-01-13 ENCOUNTER — Ambulatory Visit (INDEPENDENT_AMBULATORY_CARE_PROVIDER_SITE_OTHER): Payer: Medicare Other | Admitting: Family Medicine

## 2019-01-13 ENCOUNTER — Other Ambulatory Visit: Payer: Self-pay

## 2019-01-13 ENCOUNTER — Encounter: Payer: Self-pay | Admitting: Family Medicine

## 2019-01-13 DIAGNOSIS — N390 Urinary tract infection, site not specified: Secondary | ICD-10-CM

## 2019-01-13 DIAGNOSIS — R319 Hematuria, unspecified: Secondary | ICD-10-CM | POA: Diagnosis not present

## 2019-01-13 MED ORDER — SULFAMETHOXAZOLE-TRIMETHOPRIM 800-160 MG PO TABS: 1.0000 | ORAL_TABLET | Freq: Two times a day (BID) | ORAL | 0 refills | Status: AC

## 2019-01-13 NOTE — Progress Notes (Signed)
Patient ID: Kimberly Rojas, female   DOB: Dec 02, 1945, 73 y.o.   MRN: [id number]    Virtual Visit via video Note  This visit type was conducted due to national recommendations for restrictions regarding the COVID-19 pandemic (e.g. social distancing).  This format is felt to be most appropriate for this patient at this time.  All issues noted in this document were discussed and addressed.  No physical exam was performed (except for noted visual exam findings with Video Visits).   I connected with Kimberly Rojas Win today at  9:20 AM EDT by a video enabled telemedicine application and verified that I am speaking with the correct person using two identifiers. Location patient: home Location provider: [facility] Persons participating in the virtual visit: patient, provider  I discussed the limitations, risks, security and privacy concerns of performing an evaluation and management service by video and the availability of in person appointments. I also discussed with the patient that there may be a patient responsible charge related to this service. The patient expressed understanding and agreed to proceed.   HPI:  Patient and I connected via video today due to suspected UTI.  Patient has had increased pressure, burning with urination and also some low back pain for the past couple of days.  Patient states she did notice a small amount of blood in urine as well.  She does not commonly get UTIs.  This is probably the first UTI she has had in many many years.    Denies abdominal pain, vomiting or diarrhea.  Denies fever or chills.  Denies cough, shortness of breath or wheezing.  Denies body aches.  Overall patient states she is feeling much better after being very sick with influenza and pneumonia earlier this year.   ROS: See pertinent positives and negatives per HPI.  Past Medical History:  Diagnosis Date  . Arthritis   . Chicken pox   . Fatigue   . Hair loss   . Heart murmur    hx of    . Heartburn   . Hyperlipidemia   . Migraines   . Peptic ulcer     Past Surgical History:  Procedure Laterality Date  . BREAST EXCISIONAL BIOPSY Left 2005-2010  . BREAST SURGERY     left breast biopsy   . CHOLECYSTECTOMY    . DILATION AND CURETTAGE OF UTERUS    . SHOULDER OPEN ROTATOR CUFF REPAIR Right 03/31/2013   Procedure: RIGHT ROTATOR CUFF REPAIR SHOULDER OPEN;  Surgeon: Jacki Conesonald A Gioffre, MD;  Location: [facility];  Service: Orthopedics;  Laterality: Right;  With Patch and Anchor  . TONSILLECTOMY AND ADENOIDECTOMY      Family History  Problem Relation Age of Onset  . Breast cancer Mother [id number]  . Arthritis Mother   . Chronic fatigue Mother   . Diabetes Father   . Diabetes Paternal Grandfather    Social History   Tobacco Use  . Smoking status: Never Smoker  . Smokeless tobacco: Never Used  Substance Use Topics  . Alcohol use: Yes    Alcohol/week: 1.0 standard drinks    Types: 1 Glasses of wine per week    Current Outpatient Medications:  .  ALPRAZolam (XANAX) 0.25 MG tablet, Take 1 tablet (0.25 mg total) by mouth daily as needed. for anxiety, Disp: 30 tablet, Rfl: 0 .  busPIRone (BUSPAR) 7.5 MG tablet, Take 1 tablet (7.5 mg total) by mouth 3 (three) times daily., Disp: 90 tablet, Rfl: 3 .  cholecalciferol (VITAMIN D) 1000  units tablet, Take 1,000 Units by mouth daily., Disp: , Rfl:  .  gabapentin (NEURONTIN) 100 MG capsule, Take 1 capsule (100 mg total) by mouth 3 (three) times daily., Disp: 90 capsule, Rfl: 3 .  meclizine (ANTIVERT) 12.5 MG tablet, Take 1 tablet (12.5 mg total) by mouth 3 (three) times daily as needed for dizziness. Can cause drowsiness., Disp: 30 tablet, Rfl: 1 .  Multiple Vitamins-Minerals (MULTIVITAMIN PO), Take by mouth daily., Disp: , Rfl:  .  pantoprazole (PROTONIX) 40 MG tablet, Take 1 tablet (40 mg total) by mouth daily., Disp: 30 tablet, Rfl: 2  EXAM:  GENERAL: alert, oriented, appears well and in no acute distress  HEENT: atraumatic,  conjunttiva clear, no obvious abnormalities on inspection of external nose and ears  NECK: normal movements of the head and neck  LUNGS: on inspection no signs of respiratory distress, breathing rate appears normal, no obvious gross SOB, gasping or wheezing  CV: no obvious cyanosis  MS: moves all visible extremities without noticeable abnormality  PSYCH/NEURO: pleasant and cooperative, no obvious depression or anxiety, speech and thought processing grossly intact  ASSESSMENT AND PLAN:  Discussed the following assessment and plan:  Urinary tract infection with hematuria, site unspecified - Plan: sulfamethoxazole-trimethoprim (BACTRIM DS) 800-160 MG tablet  We will treat with Bactrim twice daily for 5 days.  Advised to increase water intake, avoid excess sugary caffeinated beverages, wear cotton underwear and always wipe front to back after using restroom. Advised that if symptoms persist after treatment to let us know and at that time we will have her come into office to drop off urine sample.    I discussed the assessment and treatment plan with the patient. The patient was provided an opportunity to ask questions and all were answered. The patient agreed with the plan and demonstrated an understanding of the instructions.   The patient was advised to call back or seek an in-person evaluation if the symptoms worsen or if the condition fails to improve as anticipated.   Tracey Harries, FNP

## 2019-01-18 DIAGNOSIS — M62830 Muscle spasm of back: Secondary | ICD-10-CM | POA: Diagnosis not present

## 2019-01-18 DIAGNOSIS — M5136 Other intervertebral disc degeneration, lumbar region: Secondary | ICD-10-CM | POA: Diagnosis not present

## 2019-01-18 DIAGNOSIS — M48062 Spinal stenosis, lumbar region with neurogenic claudication: Secondary | ICD-10-CM | POA: Diagnosis not present

## 2019-01-18 DIAGNOSIS — M5416 Radiculopathy, lumbar region: Secondary | ICD-10-CM | POA: Diagnosis not present

## 2019-01-18 DIAGNOSIS — M5134 Other intervertebral disc degeneration, thoracic region: Secondary | ICD-10-CM | POA: Diagnosis not present

## 2019-01-21 ENCOUNTER — Emergency Department: Payer: Medicare Other

## 2019-01-21 ENCOUNTER — Encounter: Payer: Self-pay | Admitting: Emergency Medicine

## 2019-01-21 ENCOUNTER — Emergency Department
Admission: EM | Admit: 2019-01-21 | Discharge: 2019-01-21 | Disposition: A | Discharge status: Home / Self Care | Payer: Medicare Other | Attending: Emergency Medicine | Admitting: Emergency Medicine

## 2019-01-21 ENCOUNTER — Other Ambulatory Visit: Payer: Self-pay

## 2019-01-21 DIAGNOSIS — R0602 Shortness of breath: Secondary | ICD-10-CM | POA: Diagnosis not present

## 2019-01-21 DIAGNOSIS — Z79899 Other long term (current) drug therapy: Secondary | ICD-10-CM | POA: Insufficient documentation

## 2019-01-21 DIAGNOSIS — J8410 Pulmonary fibrosis, unspecified: Secondary | ICD-10-CM | POA: Diagnosis not present

## 2019-01-21 DIAGNOSIS — R7989 Other specified abnormal findings of blood chemistry: Secondary | ICD-10-CM | POA: Diagnosis not present

## 2019-01-21 DIAGNOSIS — R110 Nausea: Secondary | ICD-10-CM | POA: Insufficient documentation

## 2019-01-21 DIAGNOSIS — R002 Palpitations: Secondary | ICD-10-CM | POA: Insufficient documentation

## 2019-01-21 DIAGNOSIS — R000 Tachycardia, unspecified: Secondary | ICD-10-CM | POA: Diagnosis not present

## 2019-01-21 LAB — CBC WITH DIFFERENTIAL/PLATELET
Abs Immature Granulocytes: 0.04 10*3/uL (ref 0.00–0.07)
Basophils Absolute: 0.1 10*3/uL (ref 0.0–0.1)
Basophils Relative: 0 %
Eosinophils Absolute: 0.3 10*3/uL (ref 0.0–0.5)
Eosinophils Relative: 3 %
HCT: 39.7 % (ref 36.0–46.0)
Hemoglobin: 13.4 g/dL (ref 12.0–15.0)
Immature Granulocytes: 0 %
Lymphocytes Relative: 24 %
Lymphs Abs: 2.8 10*3/uL (ref 0.7–4.0)
MCH: 29.8 pg (ref 26.0–34.0)
MCHC: 33.8 g/dL (ref 30.0–36.0)
MCV: 88.2 fL (ref 80.0–100.0)
Monocytes Absolute: 1.2 10*3/uL — ABNORMAL HIGH (ref 0.1–1.0)
Monocytes Relative: 10 %
Neutro Abs: 7.6 10*3/uL (ref 1.7–7.7)
Neutrophils Relative %: 63 %
Platelets: 250 10*3/uL (ref 150–400)
RBC: 4.5 MIL/uL (ref 3.87–5.11)
RDW: 13.8 % (ref 11.5–15.5)
WBC: 12 10*3/uL — ABNORMAL HIGH (ref 4.0–10.5)
nRBC: 0 % (ref 0.0–0.2)

## 2019-01-21 LAB — COMPREHENSIVE METABOLIC PANEL
ALT: 18 U/L (ref 0–44)
AST: 26 U/L (ref 15–41)
Albumin: 4.1 g/dL (ref 3.5–5.0)
Alkaline Phosphatase: 91 U/L (ref 38–126)
Anion gap: 12 (ref 5–15)
BUN: 14 mg/dL (ref 8–23)
CO2: 22 mmol/L (ref 22–32)
Calcium: 9 mg/dL (ref 8.9–10.3)
Chloride: 103 mmol/L (ref 98–111)
Creatinine, Ser: 0.81 mg/dL (ref 0.44–1.00)
GFR calc Af Amer: >60 mL/min (ref >60)
GFR calc non Af Amer: >60 mL/min (ref >60)
Glucose, Bld: 127 mg/dL — ABNORMAL HIGH (ref 70–99)
Potassium: 3.1 mmol/L — ABNORMAL LOW (ref 3.5–5.1)
Sodium: 137 mmol/L (ref 135–145)
Total Bilirubin: 0.5 mg/dL (ref 0.3–1.2)
Total Protein: 7.3 g/dL (ref 6.5–8.1)

## 2019-01-21 LAB — TROPONIN I
Troponin I: <0.03 ng/mL (ref <0.03)
Troponin I: <0.03 ng/mL (ref <0.03)

## 2019-01-21 LAB — FIBRIN DERIVATIVES D-DIMER (ARMC ONLY): Fibrin derivatives D-dimer (ARMC): 694.51 ng/mL (FEU) — ABNORMAL HIGH (ref 0.00–499.00)

## 2019-01-21 MED ORDER — AEROCHAMBER PLUS MISC: 2 refills | Status: DC

## 2019-01-21 MED ORDER — ALBUTEROL SULFATE HFA 108 (90 BASE) MCG/ACT IN AERS: 2.0000 | INHALATION_SPRAY | Freq: Four times a day (QID) | RESPIRATORY_TRACT | 2 refills | Status: DC | PRN

## 2019-01-21 MED ORDER — IOHEXOL 350 MG/ML SOLN
75.0000 mL | Freq: Once | INTRAVENOUS | Status: AC | PRN
  Administered 2019-01-21: 75 mL via INTRAVENOUS

## 2019-01-21 NOTE — ED Notes (Signed)
Pt speaking with this RN, A&Ox4. Pt reports her heart is racing but denies chest pain

## 2019-01-21 NOTE — ED Provider Notes (Signed)
Patient received in signout from Dr. Cyril Loosen.  Repeat troponin negative.  CTA is reassuring.  Mild leukocytosis but no clear evidence of pneumonia or consolidation.  She does not have any hypoxia or tachycardia.  Will give albuterol inhaler prescription.  Patient stable and appropriate for outpatient follow-up.   Willy Eddy, MD 01/21/19 [phone]

## 2019-01-21 NOTE — ED Notes (Signed)
Pt otf for CT

## 2019-01-21 NOTE — ED Provider Notes (Signed)
[facility] Emergency Department Provider Note   ____________________________________________    I have reviewed the triage vital signs and the nursing notes.   HISTORY  Chief Complaint Rapid heartbeat and shortness of breath    HPI Kimberly Rojas is a 73 y.o. female who presents because of complaints of palpitations and mild shortness of breath this morning.  Patient describes that she was sitting at her computer and developed a significant feeling of nausea without abdominal pain.  She then felt like her heart was racing.  And additionally she felt some mild shortness of breath.  She reports frequent shortness of breath in the mornings.  Currently her shortness of breath has improved and she reports her heart rate feels more normal now.  She did not have any chest pain.  No calf pain or swelling.  No pleurisy.  Past Medical History:  Diagnosis Date  . Arthritis   . Chicken pox   . Fatigue   . Hair loss   . Heart murmur    hx of   . Heartburn   . Hyperlipidemia   . Migraines   . Peptic ulcer     Patient Active Problem List   Diagnosis Date Noted  . RLL pneumonia (HCC) 12/02/2018  . Otitis media 12/02/2018  . Hiatal hernia 11/29/2017  . Fatigue 11/29/2017  . Atypical chest pain 09/20/2017  . GERD with esophagitis 09/20/2017  . Generalized anxiety disorder 09/20/2017  . White coat syndrome without diagnosis of hypertension 05/21/2017  . Osteopenia 01/09/2017  . 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 Chronic systolic dysfunction of right ventricle 11/05/2015  . Encounter for general adult medical examination with abnormal findings 11/05/2015  . Acquired hypothyroidism 11/23/2014  . Pure hypercholesterolemia 11/23/2014  . Neuritis or radiculitis due to rupture of lumbar intervertebral disc 04/20/2014  . Complete tear of rotator cuff 03/31/2013    Past Surgical History:  Procedure Laterality Date  . BREAST EXCISIONAL BIOPSY  Left 2005-2010  . BREAST SURGERY     left breast biopsy   . CHOLECYSTECTOMY    . DILATION AND CURETTAGE OF UTERUS    . SHOULDER OPEN ROTATOR CUFF REPAIR Right 03/31/2013   Procedure: RIGHT ROTATOR CUFF REPAIR SHOULDER OPEN;  Surgeon: Jacki Cones, MD;  Location: [facility];  Service: Orthopedics;  Laterality: Right;  With Patch and Anchor  . TONSILLECTOMY AND ADENOIDECTOMY      Prior to Admission medications   Medication Sig Start Date End Date Taking? Authorizing Provider  ALPRAZolam (XANAX) 0.25 MG tablet Take 1 tablet (0.25 mg total) by mouth daily as needed. for anxiety 05/21/17  Yes Sherlene Shams, MD  cholecalciferol (VITAMIN D3) 25 MCG (1000 UT) tablet Take 1,000 Units by mouth daily.   Yes [provider]  pantoprazole (PROTONIX) 40 MG tablet Take 1 tablet (40 mg total) by mouth daily. 12/29/18 03/29/19 Yes Salena Saner, MD  busPIRone (BUSPAR) 7.5 MG tablet Take 1 tablet (7.5 mg total) by mouth 3 (three) times daily. Patient not taking: Reported on 01/21/2019 09/18/17   Sherlene Shams, MD  gabapentin (NEURONTIN) 100 MG capsule Take 1 capsule (100 mg total) by mouth 3 (three) times daily. Patient not taking: Reported on 01/21/2019 11/26/17   Sherlene Shams, MD  meclizine (ANTIVERT) 12.5 MG tablet Take 1 tablet (12.5 mg total) by mouth 3 (three) times daily as needed for dizziness. Can cause drowsiness. Patient not taking: Reported on 01/21/2019 09/04/18   Guse,  Lauren M, FNP     Allergies Tramadol and Ibuprofen  Family HJanna Archistory  Problem Relation Age of Onset  . Breast cancer Mother [id number]  . Arthritis Mother   . Chronic fatigue Mother   . Diabetes Father   . Diabetes Paternal Grandfather     Social History Social History   Tobacco Use  . Smoking status: Never Smoker  . Smokeless tobacco: Never Used  Substance Use Topics  . Alcohol use: Yes    Alcohol/week: 1.0 standard drinks    Types: 1 Glasses of wine per week  . Drug use: No    Review of Systems   Constitutional: No fever/chills Eyes: No visual changes.  ENT: No sore throat. Cardiovascular: Denies chest pain.  Palpitations as above Respiratory: As above Gastrointestinal: No abdominal pain.  As above Genitourinary: Negative for dysuria. Musculoskeletal: Negative for back pain. Skin: Negative for rash. Neurological: Negative for headaches   ____________________________________________   PHYSICAL EXAM:  VITAL SIGNS: ED Triage Vitals  Enc Vitals Group     BP 01/21/19 1147 (!) 174/83     Pulse Rate 01/21/19 1147 82     Resp 01/21/19 1147 18     Temp 01/21/19 1147 97.7 F (36.5 C)     Temp Source 01/21/19 1147 Oral     SpO2 01/21/19 1146 100 %     Weight 01/21/19 1139 58.5 kg (129 lb)     Height 01/21/19 1139 1.549 m (5\' 1" )     Head Circumference --      Peak Flow --      Pain Score 01/21/19 1139 0     Pain Loc --      Pain Edu? --      Excl. in GC? --     Constitutional: Alert and oriented.  Eyes: Conjunctivae are normal.   Nose: No congestion/rhinnorhea. Mouth/Throat: Mucous membranes are moist.    Cardiovascular: Normal rate, regular rhythm. Grossly normal heart sounds.  Good peripheral circulation. Respiratory: Normal respiratory effort.  No retractions. Lungs CTAB. Gastrointestinal: Soft and nontender. No distention.    Musculoskeletal: No lower extremity tenderness nor edema.  Warm and well perfused Neurologic:  Normal speech and language. No gross focal neurologic deficits are appreciated.  Skin:  Skin is warm, dry and intact. No rash noted. Psychiatric: Mood and affect are normal. Speech and behavior are normal.  ____________________________________________   LABS (all labs ordered are listed, but only abnormal results are displayed)  Labs Reviewed  COMPREHENSIVE METABOLIC PANEL - Abnormal; Notable for the following components:      Result Value   Potassium 3.1 (*)    Glucose, Bld 127 (*)    All other components within normal limits  CBC WITH  DIFFERENTIAL/PLATELET - Abnormal; Notable for the following components:   WBC 12.0 (*)    Monocytes Absolute 1.2 (*)    All other components within normal limits  FIBRIN DERIVATIVES D-DIMER ([facility] ONLY) - Abnormal; Notable for the following components:   Fibrin derivatives D-dimer [facility] 694.51 (*)    All other components within normal limits  TROPONIN I  TROPONIN I   ____________________________________________  EKG  ED ECG REPORT I, Jene Everyobert Saagar Tortorella, the attending physician, personally viewed and interpreted this ECG.  Date: 01/21/2019  Rhythm: normal sinus rhythm QRS Axis: normal Intervals: normal ST/T Wave abnormalities: normal Narrative Interpretation: no evidence of acute ischemia  ____________________________________________  RADIOLOGY  Chest x-ray unremarkable, reviewed by me CT scan unremarkable ____________________________________________   PROCEDURES  Procedure(s) performed: No  Procedures   Critical Care performed: No ____________________________________________   INITIAL IMPRESSION / ASSESSMENT AND PLAN / ED COURSE  Pertinent labs & imaging results that were available during my care of the patient were reviewed by me and considered in my medical decision making (see chart for details).  Patient presents with complaints of nausea, palpitations, shortness of breath, differential is wide but certainly could be atypical presentation of ACS although EKG is reassuring, less likely PE as no pleurisy or chest pain or leg swelling however we will send d-dimer.  Also possibility of paroxysmal arrhythmia.  Pending labs   Lab work is overall reassuring mild elevation of d-dimer, discussed the patient will obtain CTA   CTA is negative for PE.  Pending second troponin have asked my colleague to follow-up on this result of negative anticipate discharge    ____________________________________________   FINAL CLINICAL IMPRESSION(S) / ED DIAGNOSES  Final  diagnoses:  Palpitations  SOB (shortness of breath)        Note:  This document was prepared using Dragon voice recognition software and may include unintentional dictation errors.   Jene Every, MD 01/21/19 1515

## 2019-01-21 NOTE — ED Triage Notes (Signed)
Pt reports feels like her heart is beating really fast and it is hard to breathe. Pt appears uncomfortable in the WR.

## 2019-01-22 ENCOUNTER — Telehealth: Payer: Self-pay

## 2019-01-22 NOTE — Telephone Encounter (Signed)
Copied from CRM [id number]. Topic: General - Other >> Jan 22, 2019 11:54 AM Percival Spanish wrote:  Pt call and req a ER fup app with Dr Darrick Huntsman   called office no answer

## 2019-01-26 NOTE — Telephone Encounter (Signed)
Pt is scheduled for a virtual visit on Thursday at 11:30am. Pt is aware of appt date and time.

## 2019-01-28 ENCOUNTER — Encounter: Payer: Self-pay | Admitting: Internal Medicine

## 2019-01-28 ENCOUNTER — Ambulatory Visit (INDEPENDENT_AMBULATORY_CARE_PROVIDER_SITE_OTHER): Payer: Medicare Other | Admitting: Internal Medicine

## 2019-01-28 ENCOUNTER — Other Ambulatory Visit: Payer: Self-pay

## 2019-01-28 DIAGNOSIS — J8410 Pulmonary fibrosis, unspecified: Secondary | ICD-10-CM | POA: Diagnosis not present

## 2019-01-28 DIAGNOSIS — R0683 Snoring: Secondary | ICD-10-CM

## 2019-01-28 DIAGNOSIS — I471 Supraventricular tachycardia: Secondary | ICD-10-CM | POA: Diagnosis not present

## 2019-01-28 MED ORDER — DILTIAZEM HCL 30 MG PO TABS: 30.0000 mg | ORAL_TABLET | Freq: Four times a day (QID) | ORAL | 0 refills | Status: DC | PRN

## 2019-01-28 NOTE — Progress Notes (Signed)
Virtual Visit via [email or web address]  This visit type was conducted due to national recommendations for restrictions regarding the COVID-19 pandemic (e.g. social distancing).  This format is felt to be most appropriate for this patient at this time.  All issues noted in this document were discussed and addressed.  No physical exam was performed (except for noted visual exam findings with Video Visits).   I connected with@ on 01/28/19 at 11:30 AM EDT by a video enabled telemedicine application or telephone and verified that I am speaking with the correct person using two identifiers. Location patient: home Location provider: work or home office Persons participating in the virtual visit: patient, provider  I discussed the limitations, risks, security and privacy concerns of performing an evaluation and management service by telephone and the availability of in person appointments. I also discussed with the patient that there may be a patient responsible charge related to this service. The patient expressed understanding and agreed to proceed.  Reason for visit: ER follow up  ZOX:[id number]:negative  73 yr old female with hyperlipidemia and hypertension  evaluated and treated in ER on May 21 for dyspnea, palpitations, tachycardia. Of sudden onset. EKG not atrial fib.  Troponin was neg ,  D dimer was  positive,  CTA done;  No P>  2 <  1 cm noules RML. Emphysema and fibrosis noted on CT .  She is a lifelong non smoker .  Imaging reports and labs reviewed with patient in detail , including a discussion of the significance D Ddme \   ROS: Patient denies headache, fevers, malaise, unintentional weight loss, skin rash, eye pain, sinus congestion and sinus pain, sore throat, dysphagia,  hemoptysis , cough,  wheezing, chest pain, palpitations, orthopnea, edema, abdominal pain, nausea, melena, diarrhea, constipation, flank pain, dysuria, hematuria, urinary  Frequency, nocturia, numbness, tingling, seizures,  Focal weakness, Loss of  consciousness,  Tremor, insomnia, depression, anxiety, and suicidal ideation.    Past Medical History:  Diagnosis Date  . Arthritis   . Chicken pox   . Fatigue   . Hair loss   . Heart murmur    hx of   . Heartburn   . Hyperlipidemia   . Migraines   . Peptic ulcer     Past Surgical History:  Procedure Laterality Date  . BREAST EXCISIONAL BIOPSY Left 2005-2010  . BREAST SURGERY     left breast biopsy   . CHOLECYSTECTOMY    . DILATION AND CURETTAGE OF UTERUS    . SHOULDER OPEN ROTATOR CUFF REPAIR Right 03/31/2013   Procedure: RIGHT ROTATOR CUFF REPAIR SHOULDER OPEN;  Surgeon: Jacki Conesonald A Gioffre, MD;  Location: [facility];  Service: Orthopedics;  Laterality: Right;  With Patch and Anchor  . TONSILLECTOMY AND ADENOIDECTOMY      Family History  Problem Relation Age of Onset  . Breast cancer Mother [id number]  . Arthritis Mother   . Chronic fatigue Mother   . Diabetes Father   . Diabetes Paternal Grandfather     SOCIAL HX:: . Social History   Tobacco Use  . Smoking status: Never Smoker  . Smokeless tobacco: Never Used  Substance Use Topics  . Alcohol use: Yes    Alcohol/week: 1.0 standard drinks    Types: 1 Glasses of wine per week  . Drug use: No    Current Outpatient Medications:  .  albuterol (VENTOLIN HFA) 108 (90 Base) MCG/ACT inhaler, Inhale 2 puffs into the lungs every 6 (six) hours as needed for wheezing or shortness of  breath., Disp: 1 Inhaler, Rfl: 2 .  ALPRAZolam (XANAX) 0.25 MG tablet, Take 1 tablet (0.25 mg total) by mouth daily as needed. for anxiety, Disp: 30 tablet, Rfl: 0 .  cholecalciferol (VITAMIN D3) 25 MCG (1000 UT) tablet, Take 1,000 Units by mouth daily., Disp: , Rfl:  .  pantoprazole (PROTONIX) 40 MG tablet, Take 1 tablet (40 mg total) by mouth daily., Disp: 30 tablet, Rfl: 2 .  Spacer/Aero-Holding Chambers (AEROCHAMBER PLUS) inhaler, Use as instructed, Disp: 1 each, Rfl: 2 .  busPIRone (BUSPAR) 7.5 MG tablet, Take 1 tablet (7.5 mg total) by mouth 3 (three)  times daily. (Patient not taking: Reported on 01/21/2019), Disp: 90 tablet, Rfl: 3 .  diltiazem (CARDIZEM) 30 MG tablet, Take 1 tablet (30 mg total) by mouth every 6 (six) hours as needed. For rapid heart rate, Disp: 30 tablet, Rfl: 0 .  gabapentin (NEURONTIN) 100 MG capsule, Take 1 capsule (100 mg total) by mouth 3 (three) times daily. (Patient not taking: Reported on 01/21/2019), Disp: 90 capsule, Rfl: 3 .  meclizine (ANTIVERT) 12.5 MG tablet, Take 1 tablet (12.5 mg total) by mouth 3 (three) times daily as needed for dizziness. Can cause drowsiness. (Patient not taking: Reported on 01/21/2019), Disp: 30 tablet, Rfl: 1  EXAM:  VITALS per patient if applicable:  GENERAL: alert, oriented, appears well and in no acute distress  HEENT: atraumatic, conjunttiva clear, no obvious abnormalities on inspection of external nose and ears  NECK: normal movements of the head and neck  LUNGS: on inspection no signs of respiratory distress, breathing rate appears normal, no obvious gross SOB, gasping or wheezing  CV: no obvious cyanosis  MS: moves all visible extremities without noticeable abnormality  PSYCH/NEURO: pleasant and cooperative, no obvious depression or anxiety, speech and thought processing grossly intact  ASSESSMENT AND PLAN:  Discussed the following assessment and plan:  SVT (supraventricular tachycardia) (HCC) - Plan: Ambulatory referral to Sleep Studies  Pulmonary fibrosis (HCC) - Plan: Ambulatory referral to Sleep Studies  Snoring - Plan: Ambulatory referral to Sleep Studies  SVT (supraventricular tachycardia) (HCC) Etiology unclear.  Cardiac enzymes were normal.  diet reviewed labs reviewed  will add cardizem for prn use.  Sleep study ordered to rule out OSA as the cause     I discussed the assessment and treatment plan with the patient. The patient was provided an opportunity to ask questions and all were answered. The patient agreed with the plan and demonstrated an  understanding of the instructions.   The patient was advised to call back or seek an in-person evaluation if the symptoms worsen or if the condition fails to improve as anticipated.  I provided 25 minutes of non-face-to-face time during this encounter.   Sherlene Shams, MD

## 2019-01-28 NOTE — Patient Instructions (Signed)
The D Dimer is very NON SPECIFIC .  When it is elevated, It COULD indicate a blood clot,  But it could also be elevated if you have inflammation ,  Or a recent procedure.  Your CT scan was done to make sure you did not have a blood clot.  YOU DO NOT!  I am prescribing cardizem ,  To slow your heart rate down if it happens again.  HOPEFULLY you won't ever need it.  I am ordering a sleep study to evaluate your breathing and heart rate while you sleep

## 2019-01-28 NOTE — Progress Notes (Signed)
Pt stated that she went to the ED for SOBr, heart palpitations, and nausea. Pt stated that she feeling fine now but is "nervous" about the d dimer result. Pt stated that the ED said nothing about it while she was there.

## 2019-01-30 DIAGNOSIS — I471 Supraventricular tachycardia, unspecified: Secondary | ICD-10-CM | POA: Insufficient documentation

## 2019-01-30 NOTE — Assessment & Plan Note (Signed)
Etiology unclear.  Cardiac enzymes were normal.  diet reviewed labs reviewed  will add cardizem for prn use.  Sleep study ordered to rule out OSA as the cause

## 2019-02-01 DIAGNOSIS — S83272A Complex tear of lateral meniscus, current injury, left knee, initial encounter: Secondary | ICD-10-CM | POA: Insufficient documentation

## 2019-02-01 DIAGNOSIS — M1712 Unilateral primary osteoarthritis, left knee: Secondary | ICD-10-CM | POA: Diagnosis not present

## 2019-02-08 ENCOUNTER — Ambulatory Visit (INDEPENDENT_AMBULATORY_CARE_PROVIDER_SITE_OTHER): Payer: Medicare Other | Admitting: Internal Medicine

## 2019-02-08 ENCOUNTER — Encounter: Payer: Self-pay | Admitting: Internal Medicine

## 2019-02-08 ENCOUNTER — Other Ambulatory Visit: Payer: Self-pay

## 2019-02-08 DIAGNOSIS — R21 Rash and other nonspecific skin eruption: Secondary | ICD-10-CM | POA: Diagnosis not present

## 2019-02-08 DIAGNOSIS — H00019 Hordeolum externum unspecified eye, unspecified eyelid: Secondary | ICD-10-CM | POA: Insufficient documentation

## 2019-02-08 DIAGNOSIS — H00015 Hordeolum externum left lower eyelid: Secondary | ICD-10-CM

## 2019-02-08 MED ORDER — TRIAMCINOLONE ACETONIDE 0.1 % EX CREA: 1.0000 "application " | TOPICAL_CREAM | Freq: Two times a day (BID) | CUTANEOUS | 0 refills | Status: DC

## 2019-02-08 MED ORDER — ERYTHROMYCIN 5 MG/GM OP OINT: 1.0000 "application " | TOPICAL_OINTMENT | Freq: Three times a day (TID) | OPHTHALMIC | 0 refills | Status: DC

## 2019-02-08 NOTE — Assessment & Plan Note (Signed)
No improvement with 2 weeks of warm compresses.  Adding erythromycin ointment

## 2019-02-08 NOTE — Assessment & Plan Note (Signed)
Contact dermatitis vs bug bite.  No evidence of shingles based on current exam and symptoms.  Triamcinolone cream bid

## 2019-02-08 NOTE — Progress Notes (Signed)
Virtual Visit via [email or web address]  This visit type was conducted due to national recommendations for restrictions regarding the COVID-19 pandemic (e.g. social distancing).  This format is felt to be most appropriate for this patient at this time.  All issues noted in this document were discussed and addressed.  No physical exam was performed (except for noted visual exam findings with Video Visits).   I connected with@ on 02/08/19 at  9:00 AM EDT by a video enabled telemedicine application or telephone and verified that I am speaking with the correct person using two identifiers. Location patient: home Location provider: work or home office Persons participating in the virtual visit: patient, provider  I discussed the limitations, risks, security and privacy concerns of performing an evaluation and management service by telephone and the availability of in person appointments. I also discussed with the patient that there may be a patient responsible charge related to this service. The patient expressed understanding and agreed to proceed.    Reason for visit: breast rash  HPI:  73 yr old female with sudden onset of a  pruritic rash on the anterior medial surface of right breast. Occurred on Saturday as a red papule , has developed several papules  since then.  Denies pain, fevers, tick bites.  Worked outside in the yard pulling weeds the day before,  And she also has  a small lapdog that also goes outside.  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  2) style left  Lower eyelid.  2 weeks of using warm compresses.  No change    ROS: See pertinent positives and negatives per HPI.  Past Medical History:  Diagnosis Date  . Arthritis   . Chicken pox   . Fatigue   . Hair loss   . Heart murmur    hx of   . Heartburn   . Hyperlipidemia   . Migraines   . Peptic ulcer     Past Surgical History:  Procedure Laterality Date  . BREAST EXCISIONAL BIOPSY Left 2005-2010  . BREAST  SURGERY     left breast biopsy   . CHOLECYSTECTOMY    . DILATION AND CURETTAGE OF UTERUS    . SHOULDER OPEN ROTATOR CUFF REPAIR Right 03/31/2013   Procedure: RIGHT ROTATOR CUFF REPAIR SHOULDER OPEN;  Surgeon: Jacki Conesonald A Gioffre, MD;  Location: [facility];  Service: Orthopedics;  Laterality: Right;  With [facility] [employer] [facility]  . TONSILLECTOMY AND ADENOIDECTOMY      Family History  Problem Relation Age of Onset  . Breast cancer Mother 678  . Arthritis Mother   . Chronic fatigue Mother   . Diabetes Father   . Diabetes Paternal Grandfather     SOCIAL HX: widowed ,  IADL.    Current Outpatient Medications:  .  albuterol (VENTOLIN HFA) 108 (90 Base) MCG/ACT inhaler, Inhale 2 puffs into the lungs every 6 (six) hours as needed for wheezing or shortness of breath., Disp: 1 Inhaler, Rfl: 2 .  ALPRAZolam (XANAX) 0.25 MG tablet, Take 1 tablet (0.25 mg total) by mouth daily as needed. for anxiety, Disp: 30 tablet, Rfl: 0 .  cholecalciferol (VITAMIN D3) 25 MCG (1000 UT) tablet, Take 1,000 Units by mouth daily., Disp: , Rfl:  .  diltiazem (CARDIZEM) 30 MG tablet, Take 1 tablet (30 mg total) by mouth every 6 (six) hours as needed. For rapid heart rate, Disp: 30 tablet, Rfl: 0 .  pantoprazole (PROTONIX) 40 MG tablet, Take 1 tablet (40 mg total) by  mouth daily., Disp: 30 tablet, Rfl: 2 .  Spacer/Aero-Holding Chambers (AEROCHAMBER PLUS) inhaler, Use as instructed, Disp: 1 each, Rfl: 2 .  busPIRone (BUSPAR) 7.5 MG tablet, Take 1 tablet (7.5 mg total) by mouth 3 (three) times daily. (Patient not taking: Reported on 01/21/2019), Disp: 90 tablet, Rfl: 3 .  erythromycin ophthalmic ointment, Place 1 application into the left eye 3 (three) times daily., Disp: 3.5 g, Rfl: 0 .  gabapentin (NEURONTIN) 100 MG capsule, Take 1 capsule (100 mg total) by mouth 3 (three) times daily. (Patient not taking: Reported on 01/21/2019), Disp: 90 capsule, Rfl: 3 .  meclizine (ANTIVERT) 12.5 MG tablet, Take 1 tablet (12.5 mg total) by mouth 3  (three) times daily as needed for dizziness. Can cause drowsiness. (Patient not taking: Reported on 01/21/2019), Disp: 30 tablet, Rfl: 1 .  triamcinolone cream (KENALOG) 0.1 %, Apply 1 application topically 2 (two) times daily. To rash until resolved, Disp: 30 g, Rfl: 0  EXAM:  VITALS per patient if applicable:  GENERAL: alert, oriented, appears well and in no acute distress  HEENT: atraumatic, conjunctiva clear,  Left lower eyelid with stye,  Some sub orbital edema. no obvious abnormalities on inspection of external nose and ears  NECK: normal movements of the head and neck  LUNGS: on inspection no signs of respiratory distress, breathing rate appears normal, no obvious gross SOB, gasping or wheezing  CV: no obvious cyanosis  MS: moves all visible extremities without noticeable abnormality  PSYCH/NEURO: pleasant and cooperative, no obvious depression or anxiety, speech and thought processing grossly intact  Skin: left breast with papular rash , non herpetic.   ASSESSMENT AND PLAN:  Discussed the following assessment and plan:  Rash without hives  Hordeolum externum of left lower eyelid  Rash without hives Contact dermatitis vs bug bite.  No evidence of shingles based on current exam and symptoms.  Triamcinolone cream bid   Hordeolum externum (stye) No improvement with 2 weeks of warm compresses.  Adding erythromycin ointment     I discussed the assessment and treatment plan with the patient. The patient was provided an opportunity to ask questions and all were answered. The patient agreed with the plan and demonstrated an understanding of the instructions.   The patient was advised to call back or seek an in-person evaluation if the symptoms worsen or if the condition fails to improve as anticipated.  I provided 25 minutes of non-face-to-face time during this encounter.   Crecencio Mc, MD

## 2019-02-10 DIAGNOSIS — H01112 Allergic dermatitis of right lower eyelid: Secondary | ICD-10-CM | POA: Diagnosis not present

## 2019-02-10 DIAGNOSIS — H01115 Allergic dermatitis of left lower eyelid: Secondary | ICD-10-CM | POA: Diagnosis not present

## 2019-02-24 ENCOUNTER — Telehealth: Payer: Self-pay | Admitting: Pulmonary Disease

## 2019-02-24 NOTE — Telephone Encounter (Signed)
Called pt to reschedule 03/03/2019 appt. To different day.

## 2019-03-03 ENCOUNTER — Telehealth: Payer: Self-pay | Admitting: Pulmonary Disease

## 2019-03-03 ENCOUNTER — Ambulatory Visit: Payer: Medicare Other | Admitting: Pulmonary Disease

## 2019-03-03 NOTE — Telephone Encounter (Signed)
Called patient for COVID-19 pre-screening for in office visit. ° °Have you recently traveled any where out of the local area in the last 2 weeks? No ° °Have you been in close contact with a person diagnosed with COVID-19 or someone awaiting results within the last 2 weeks? No ° °Do you currently have any of the following symptoms? If so, when did they start? °Cough     Diarrhea   Joint Pain °Fever      Muscle Pain   Red eyes °Shortness of breath   Abdominal pain  Vomiting °Loss of smell    Rash    Sore Throat °Headache    Weakness   Bruising or bleeding ° ° °Okay to proceed with visit 03/04/2019  ° ° °

## 2019-03-04 ENCOUNTER — Ambulatory Visit: Payer: Medicare Other | Admitting: Pulmonary Disease

## 2019-03-04 ENCOUNTER — Encounter: Payer: Self-pay | Admitting: Pulmonary Disease

## 2019-03-04 ENCOUNTER — Other Ambulatory Visit: Payer: Self-pay

## 2019-03-04 ENCOUNTER — Ambulatory Visit (INDEPENDENT_AMBULATORY_CARE_PROVIDER_SITE_OTHER): Payer: Medicare Other | Admitting: Pulmonary Disease

## 2019-03-04 VITALS — BP 160/82 | HR 65 | Temp 98.0°F | Ht 61.0 in | Wt 132.6 lb

## 2019-03-04 DIAGNOSIS — F419 Anxiety disorder, unspecified: Secondary | ICD-10-CM | POA: Diagnosis not present

## 2019-03-04 DIAGNOSIS — K219 Gastro-esophageal reflux disease without esophagitis: Secondary | ICD-10-CM | POA: Diagnosis not present

## 2019-03-04 DIAGNOSIS — R918 Other nonspecific abnormal finding of lung field: Secondary | ICD-10-CM

## 2019-03-04 DIAGNOSIS — R0602 Shortness of breath: Secondary | ICD-10-CM | POA: Diagnosis not present

## 2019-03-04 NOTE — Progress Notes (Deleted)
   Subjective:    Patient ID: Kimberly Rojas, female    DOB: 10-30-1945, 73 y.o.   MRN: [id number]  HPI    Review of Systems     Objective:   Physical Exam        Assessment & Plan:

## 2019-03-04 NOTE — Progress Notes (Signed)
Subjective:    Patient ID: Kimberly Rojas, female    DOB: 12-19-1945, 73 y.o.   MRN: [id number]  HPI Patient is a 73 year old lifelong never smoker who presents for follow-up on a cough that at the time of first evaluation in April had been present for 2 months.  Since that time this symptom has abated.  She has had careful attention to management of gastroesophageal reflux with pantoprazole and antireflux measures and she believes that this has helped with her cough.  She has some issues with episodic shortness of breath which could not be ascribed to pulmonary issues.  She relates this more to anxiety.  She has been given a prescription for Xanax which she actually cuts in half and she takes when she has these episodes of shortness of breath.  She feels that Xanax helps.  Other issues that aggravate her symptoms are allergic rhinitis and she has been managing this with over-the-counter nonsedating antihistamines.  We had ordered a CT scan to evaluate her chronic cough this was done in April 2020 she had no acute CT finding at the time she had very minimal changes of subpleural fibrosis which was nonspecific.  Subsequently on 21 May she presented to the emergency room with a complaint of tachycardia and shortness of breath, she was noted to have a d-dimer in the 600 range and underwent CT angiography my review of that film is that the patient had mild volume overload there was no pulmonary embolus noted there were some minimal nodular opacities noted in the right middle lobe these were new from April and I suspect that these are related to possible an episode of aspiration from reflux.  Will need to be followed with repeat CT in 6 months.  Today she has no specific complaint.  She continues to have these episodes of dyspnea that she cannot characterize except for the fact that she is very anxious when she has them.   Review of Systems  Constitutional: Negative.   HENT: Positive for postnasal drip.  Negative for congestion and voice change.   Eyes: Negative.   Respiratory: Positive for shortness of breath. Negative for cough.   Cardiovascular: Positive for palpitations.  Gastrointestinal:       + reflux  Endocrine: Negative.   Genitourinary: Negative.   Musculoskeletal: Negative.   Skin: Negative.   Allergic/Immunologic: Negative.   Neurological: Negative.   Hematological: Negative.   Psychiatric/Behavioral:       Anxiety  All other systems reviewed and are negative.      Objective:   Physical Exam Vitals signs and nursing note reviewed.  Constitutional:      General: She is not in acute distress.    Appearance: Normal appearance. She is normal weight. She is not ill-appearing.  HENT:     Head: Normocephalic and atraumatic.     Right Ear: External ear normal.     Left Ear: External ear normal.     Nose: Mucosal edema present. No septal deviation.     Right Turbinates: Swollen.     Left Turbinates: Swollen.     Mouth/Throat:     Comments: Nose/mouth/throat not examined due to masking requirements for COVID-19. Eyes:     General: No scleral icterus.    Conjunctiva/sclera: Conjunctivae normal.     Pupils: Pupils are equal, round, and reactive to light.  Neck:     Musculoskeletal: Neck supple. No muscular tenderness.     Thyroid: No thyromegaly.     Trachea:  Trachea and phonation normal.  Cardiovascular:     Rate and Rhythm: Normal rate and regular rhythm.     Pulses: Normal pulses.     Heart sounds: Normal heart sounds.  Pulmonary:     Effort: Pulmonary effort is normal.     Breath sounds: Normal breath sounds and air entry. No decreased breath sounds.  Abdominal:     General: There is no distension.  Musculoskeletal:     Right lower leg: No edema.     Left lower leg: No edema.     Comments: OA changes both hands  Lymphadenopathy:     Cervical: No cervical adenopathy.  Skin:    General: Skin is warm and dry.  Neurological:     General: No focal deficit  present.     Mental Status: She is alert and oriented to person, place, and time.     Motor: Motor function is intact.     Coordination: Coordination is intact.     Gait: Gait is intact.  Psychiatric:        Mood and Affect: Mood and affect normal. Mood is not anxious.           Assessment & Plan:   1.  Abnormal findings CT scan of the chest: I have reviewed the patient's films independently, the above film is representative.  It appears the patient had either an episode of pneumonitis associated with aspiration and/or perhaps some mild volume overload.  Given that she had not had any nodules noted less than a month prior I suspect that the issues are due to inflammation and/or acute process.  We will follow this expectantly and repeat CT scan of the chest in 6 months time.  2.  Shortness of breath/dyspnea: She describes these as sudden episodic changes.  She was noted to have some mild elevation in her blood pressure.  Query whether she has issues with diastolic dysfunction.  Will obtain a 2D echo to evaluate this issue.  3.  Gastroesophageal reflux disease: Patient has been advised to continue antireflux measures as well as continue Protonix.  4.  Anxiety: This issue adds complexity to her management and may be aggravating her symptomatology of shortness of breath.   This chart was dictated using voice recognition software/Dragon.  Despite best efforts to proofread, errors can occur which can change the meaning.  Any change was purely unintentional.

## 2019-03-04 NOTE — Patient Instructions (Signed)
1.  We are going to schedule an echocardiogram to evaluate for potential issues with relaxation evaluate your shortness of breath further.  2.  You will have a repeat CT scan of the chest in 6 months time.  3.  I will see him in follow-up in 2 months time.

## 2019-03-09 ENCOUNTER — Other Ambulatory Visit: Payer: Self-pay

## 2019-03-09 DIAGNOSIS — R0600 Dyspnea, unspecified: Secondary | ICD-10-CM

## 2019-03-09 DIAGNOSIS — R0602 Shortness of breath: Secondary | ICD-10-CM

## 2019-03-11 ENCOUNTER — Other Ambulatory Visit: Payer: Self-pay

## 2019-03-11 ENCOUNTER — Ambulatory Visit: Payer: Medicare Other

## 2019-03-11 ENCOUNTER — Ambulatory Visit
Admission: RE | Admit: 2019-03-11 | Discharge: 2019-03-11 | Disposition: A | Discharge status: Home / Self Care | Payer: Medicare Other | Source: Ambulatory Visit | Attending: Pulmonary Disease | Admitting: Pulmonary Disease

## 2019-03-11 DIAGNOSIS — E785 Hyperlipidemia, unspecified: Secondary | ICD-10-CM | POA: Insufficient documentation

## 2019-03-11 DIAGNOSIS — R0600 Dyspnea, unspecified: Secondary | ICD-10-CM | POA: Insufficient documentation

## 2019-03-11 DIAGNOSIS — R0602 Shortness of breath: Secondary | ICD-10-CM | POA: Insufficient documentation

## 2019-03-11 DIAGNOSIS — R5383 Other fatigue: Secondary | ICD-10-CM | POA: Diagnosis not present

## 2019-03-11 NOTE — Progress Notes (Signed)
*  PRELIMINARY RESULTS* Echocardiogram 2D Echocardiogram has been performed.  Sherrie Sport 03/11/2019, 10:37 AM

## 2019-03-15 ENCOUNTER — Ambulatory Visit (INDEPENDENT_AMBULATORY_CARE_PROVIDER_SITE_OTHER): Payer: Medicare Other

## 2019-03-15 ENCOUNTER — Other Ambulatory Visit: Payer: Self-pay

## 2019-03-15 DIAGNOSIS — Z0000 Encounter for general adult medical examination without abnormal findings: Secondary | ICD-10-CM

## 2019-03-15 NOTE — Patient Instructions (Addendum)
  Kimberly Rojas , Thank you for taking time to come for your Medicare Wellness Visit. I appreciate your ongoing commitment to your health goals. Please review the following plan we discussed and let me know if I can assist you in the future.   [employer]: http://[email or web address].com/content/home; [phone] [phone]   These are the goals we discussed: Goals      Patient Stated   . Follow up with Primary Care Provider (pt-stated)     Research melatonin consider taking for sleep      Other   . Increase physical activity     Walk more for exercise        This is a list of the screening recommended for you and due dates:  Health Maintenance  Topic Date Due  . Pneumonia vaccines (2 of 2 - PPSV23) 03/14/2020*  . Flu Shot  04/03/2019  . Mammogram  07/08/2019  . Colon Cancer Screening  10/02/2019  . Tetanus Vaccine  11/01/2023  . DEXA scan (bone density measurement)  Completed  .  Hepatitis C: One time screening is recommended by Center for Disease Control  (CDC) for  adults born from [birth year] through 1967.   Completed  *Topic was postponed. The date shown is not the original due date.

## 2019-03-15 NOTE — Progress Notes (Addendum)
Subjective:   Kimberly Rojas is a 73 y.o. female who presents for Medicare Annual (Subsequent) preventive examination.  Review of Systems:  No ROS.  Medicare Wellness Virtual Visit.  Visual/audio telehealth visit, UTA vital signs.   See social history for additional risk factors.   Cardiac Risk Factors include: advanced age (>1455men, 32>65 women)     Objective:     Vitals: There were no vitals taken for this visit.  There is no height or weight on file to calculate BMI.  Advanced Directives 03/15/2019 09/12/2017 09/24/2016 03/31/2013 03/26/2013  Does Patient Have a Medical Advance Directive? No No No Patient does not have advance directive Patient does not have advance directive  Would patient like information on creating a medical advance directive? Yes (MAU/Ambulatory/Procedural Areas - Information given) No - Patient declined Yes (MAU/Ambulatory/Procedural Areas - Information given) - -  Pre-existing out of facility DNR order (yellow form or pink MOST form) - - - No -    Tobacco Social History   Tobacco Use  Smoking Status Never Smoker  Smokeless Tobacco Never Used     Counseling given: Not Answered   Clinical Intake:  Pre-visit preparation completed: Yes        Diabetes: No  How often do you need to have someone help you when you read instructions, pamphlets, or other written materials from your doctor or pharmacy?: 1 - Never  Interpreter Needed?: No     Past Medical History:  Diagnosis Date  . Arthritis   . Chicken pox   . Fatigue   . Hair loss   . Heart murmur    hx of   . Heartburn   . Hyperlipidemia   . Migraines   . Peptic ulcer    Past Surgical History:  Procedure Laterality Date  . BREAST EXCISIONAL BIOPSY Left 2005-2010  . BREAST SURGERY     left breast biopsy   . CHOLECYSTECTOMY    . DILATION AND CURETTAGE OF UTERUS    . SHOULDER OPEN ROTATOR CUFF REPAIR Right 03/31/2013   Procedure: RIGHT ROTATOR CUFF REPAIR SHOULDER OPEN;  Surgeon:  Jacki Conesonald A Gioffre, MD;  Location: [facility];  Service: Orthopedics;  Laterality: Right;  With [employer]  . TONSILLECTOMY AND ADENOIDECTOMY     Family History  Problem Relation Age of Onset  . Breast cancer Mother [id number]  . Arthritis Mother   . Chronic fatigue Mother   . Diabetes Father   . Diabetes Paternal Grandfather    Social History   Socioeconomic History  . Marital status: Widowed    Spouse name: Not on file  . Number of children: Not on file  . Years of education: Not on file  . Highest education level: Not on file  Occupational History  . Not on file  Social Needs  . Financial resource strain: Not hard at all  . Food insecurity    Worry: Never true    Inability: Never true  . Transportation needs    Medical: No    Non-medical: No  Tobacco Use  . Smoking status: Never Smoker  . Smokeless tobacco: Never Used  Substance and Sexual Activity  . Alcohol use: Yes    Alcohol/week: 1.0 standard drinks    Types: 1 Glasses of wine per week  . Drug use: No  . Sexual activity: Not Currently  Lifestyle  . Physical activity    Days per week: 3 days    Minutes per session: 30 min  . Stress:  Not at all  Relationships  . Social Musicianconnections    Talks on phone: Not on file    Gets together: Not on file    Attends religious service: Not on file    Active member of club or organization: Not on file    Attends meetings of clubs or organizations: Not on file    Relationship status: Not on file  Other Topics Concern  . Not on file  Social History Narrative   Widower as of recent    Retired   HS education   Caffeine- 3 cups tea, no soda or coffee    Exercise- 3 times, walking   2 children- 1 passed     Outpatient Encounter Medications as of 03/15/2019  Medication Sig  . albuterol (VENTOLIN HFA) 108 (90 Base) MCG/ACT inhaler Inhale 2 puffs into the lungs every 6 (six) hours as needed for wheezing or shortness of breath.  . ALPRAZolam (XANAX) 0.25 MG tablet Take 1 tablet  (0.25 mg total) by mouth daily as needed. for anxiety  . busPIRone (BUSPAR) 7.5 MG tablet Take 1 tablet (7.5 mg total) by mouth 3 (three) times daily.  . cholecalciferol (VITAMIN D3) 25 MCG (1000 UT) tablet Take 1,000 Units by mouth daily.  Marland Kitchen. diltiazem (CARDIZEM) 30 MG tablet Take 1 tablet (30 mg total) by mouth every 6 (six) hours as needed. For rapid heart rate  . meclizine (ANTIVERT) 12.5 MG tablet Take 1 tablet (12.5 mg total) by mouth 3 (three) times daily as needed for dizziness. Can cause drowsiness.  . Multiple Vitamin (MULTI-VITAMIN) tablet Take by mouth.  . pantoprazole (PROTONIX) 40 MG tablet Take 1 tablet (40 mg total) by mouth daily.  Marland Kitchen. Spacer/Aero-Holding Chambers (AEROCHAMBER PLUS) inhaler Use as instructed  . triamcinolone cream (KENALOG) 0.1 % Apply 1 application topically 2 (two) times daily. To rash until resolved   No facility-administered encounter medications on file as of 03/15/2019.     Activities of Daily Living In your present state of health, do you have any difficulty performing the following activities: 03/15/2019  Hearing? Y  Comment Hearing aid, R ear only  Vision? N  Difficulty concentrating or making decisions? N  Walking or climbing stairs? N  Dressing or bathing? N  Doing errands, shopping? N  Preparing Food and eating ? N  Using the Toilet? N  In the past six months, have you accidently leaked urine? N  Do you have problems with loss of bowel control? N  Managing your Medications? N  Managing your Finances? N  Housekeeping or managing your Housekeeping? N  Some recent data might be hidden    Patient Care Team: Sherlene Shamsullo, Teresa L, MD as PCP - General (Internal Medicine)    Assessment:   This is a routine wellness examination for [name].  I connected with patient 03/15/19 at 11:00 AM EDT by an audio enabled telemedicine application and verified that I am speaking with the correct person using two identifiers. Patient stated full name and DOB. Patient  gave permission to continue with virtual visit. Patient's location was at home and Nurse's location was at [place] office.   Health Screenings  Mammogram - 07/2018 Colonoscopy - 09/2009 Bone Density - 12/2016 Glaucoma -none Hearing -hearing aid, R ear only TSH- 10/2018 Cholesterol - 10/2018 Dental- UTD Vision- visits within the last 12 months.  Social  Alcohol intake - yes      Smoking history- never    Smokers in home? none Illicit drug use? none Exercise -  walking Diet - healthy Sexually Active -not currently BMI- discussed the importance of a healthy diet, water intake and the benefits of aerobic exercise.  Educational material provided.   Safety  Patient feels safe at home- yes Patient does have smoke detectors at home- yes Patient does wear sunscreen or protective clothing when in direct sunlight -yes Patient does wear seat belt when in a moving vehicle -yes Patient drives- yes  Covid-19 precautions and sickness symptoms discussed.   Activities of Daily Living Patient denies needing assistance with: driving, household chores, feeding themselves, getting from bed to chair, getting to the toilet, bathing/showering, dressing, managing money, or preparing meals.  No new identified risk were noted.    Depression Screen Patient denies losing interest in daily life, feeling hopeless, or crying easily over simple problems.   Anxiety-  notes she is doing breathing exercise during the day as needed. Wakes at night with anxiety sometimes and difficulty relaxing back to sleep. She is considering taking melatonin to help her rest through the night but plans to research first. Deferred to follow up with pcp if anxiety or sleep worsens. Patient states she will pursue further intervention as needed but ok today.   Medication-taking as directed and without issues.   Fall Screen Patient denies being afraid of falling or falling in the last year.   Memory Screen Patient is alert.   Patient denies difficulty focusing, concentrating or misplacing items. Correctly identified the president of the [place], season and recall. Patient likes to read for brain stimulation.  Immunizations The following Immunizations were discussed: Influenza, shingles, pneumonia, and tetanus.   Other Providers Patient Care Team: Sherlene Shamsullo, Teresa L, MD as PCP - General (Internal Medicine)  Exercise Activities and Dietary recommendations Current Exercise Habits: Home exercise routine, Type of exercise: walking, Time (Minutes): 30, Frequency (Times/Week): 3, Weekly Exercise (Minutes/Week): 90, Intensity: Mild  Goals      Patient Stated   . Follow up with Primary Care Provider (pt-stated)     Research melatonin consider taking for sleep      Other   . Increase physical activity     Walk more for exercise        Fall Risk Fall Risk  03/15/2019 01/13/2019 05/29/2018 09/24/2016 11/01/2015  Falls in the past year? 0 0 No No No  Number falls in past yr: - 0 - - -  Injury with Fall? - 0 - - -  Follow up - Falls evaluation completed - - -   Depression Screen PHQ 2/9 Scores 03/15/2019 01/13/2019 05/29/2018 05/21/2017  PHQ - 2 Score 0 0 0 0  PHQ- 9 Score - 1 1 3      Cognitive Function     6CIT Screen 03/15/2019 09/24/2016  What Year? 0 points 0 points  What month? 0 points 0 points  What time? 0 points 0 points  Count back from 20 0 points 0 points  Months in reverse 0 points 0 points  Repeat phrase 0 points 0 points  Total Score 0 0    Immunization History  Administered Date(s) Administered  . Influenza-Unspecified 07/18/2015  . Pneumococcal Conjugate-13 11/26/2017  . Tdap 10/31/2013   Screening Tests Health Maintenance  Topic Date Due  . PNA vac Low Risk Adult (2 of 2 - PPSV23) 03/14/2020 (Originally 11/27/2018)  . INFLUENZA VACCINE  04/03/2019  . MAMMOGRAM  07/08/2019  . COLONOSCOPY  10/02/2019  . TETANUS/TDAP  11/01/2023  . DEXA SCAN  Completed  . Hepatitis C Screening  Completed  Plan:    End of life planning; Advance aging; Advanced directives discussed.  Copy of current HCPOA/Living Will requested upon completion.    Lifeline: http://[email or web address].com/content/home; [phone] J8119. Mailed to patient.   I have personally reviewed and noted the following in the patient's chart:   . Medical and social history . Use of alcohol, tobacco or illicit drugs  . Current medications and supplements . Functional ability and status . Nutritional status . Physical activity . Advanced directives . List of other physicians . Hospitalizations, surgeries, and ER visits in previous 12 months . Vitals . Screenings to include cognitive, depression, and falls . Referrals and appointments  In addition, I have reviewed and discussed with patient certain preventive protocols, quality metrics, and best practice recommendations. A written personalized care plan for preventive services as well as general preventive health recommendations were provided to patient.     OBrien-Blaney, Denisa L, LPN  1/47/8295    I have reviewed the above information and agree with above.   Deborra Medina, MD

## 2019-04-01 DIAGNOSIS — T63441A Toxic effect of venom of bees, accidental (unintentional), initial encounter: Secondary | ICD-10-CM | POA: Diagnosis not present

## 2019-04-01 DIAGNOSIS — M25449 Effusion, unspecified hand: Secondary | ICD-10-CM | POA: Diagnosis not present

## 2019-04-19 DIAGNOSIS — M1712 Unilateral primary osteoarthritis, left knee: Secondary | ICD-10-CM | POA: Diagnosis not present

## 2019-04-19 DIAGNOSIS — S83272D Complex tear of lateral meniscus, current injury, left knee, subsequent encounter: Secondary | ICD-10-CM | POA: Diagnosis not present

## 2019-05-03 ENCOUNTER — Ambulatory Visit: Payer: Medicare Other | Admitting: Pulmonary Disease

## 2019-05-03 ENCOUNTER — Telehealth: Payer: Self-pay | Admitting: Pulmonary Disease

## 2019-05-03 ENCOUNTER — Other Ambulatory Visit
Admission: RE | Admit: 2019-05-03 | Discharge: 2019-05-03 | Disposition: A | Discharge status: Home / Self Care | Payer: Medicare Other | Source: Ambulatory Visit | Attending: Pulmonary Disease | Admitting: Pulmonary Disease

## 2019-05-03 ENCOUNTER — Encounter: Payer: Self-pay | Admitting: Pulmonary Disease

## 2019-05-03 ENCOUNTER — Other Ambulatory Visit: Payer: Self-pay

## 2019-05-03 VITALS — BP 152/82 | HR 66 | Temp 97.7°F | Ht 61.0 in | Wt 134.4 lb

## 2019-05-03 DIAGNOSIS — R0602 Shortness of breath: Secondary | ICD-10-CM | POA: Diagnosis not present

## 2019-05-03 DIAGNOSIS — J849 Interstitial pulmonary disease, unspecified: Secondary | ICD-10-CM

## 2019-05-03 DIAGNOSIS — K219 Gastro-esophageal reflux disease without esophagitis: Secondary | ICD-10-CM | POA: Diagnosis not present

## 2019-05-03 DIAGNOSIS — F419 Anxiety disorder, unspecified: Secondary | ICD-10-CM

## 2019-05-03 DIAGNOSIS — R918 Other nonspecific abnormal finding of lung field: Secondary | ICD-10-CM | POA: Diagnosis not present

## 2019-05-03 LAB — CBC WITH DIFFERENTIAL/PLATELET
Abs Immature Granulocytes: 0.02 10*3/uL (ref 0.00–0.07)
Basophils Absolute: 0 10*3/uL (ref 0.0–0.1)
Basophils Relative: 1 %
Eosinophils Absolute: 0.4 10*3/uL (ref 0.0–0.5)
Eosinophils Relative: 6 %
HCT: 38.3 % (ref 36.0–46.0)
Hemoglobin: 12.6 g/dL (ref 12.0–15.0)
Immature Granulocytes: 0 %
Lymphocytes Relative: 22 %
Lymphs Abs: 1.6 10*3/uL (ref 0.7–4.0)
MCH: 29.4 pg (ref 26.0–34.0)
MCHC: 32.9 g/dL (ref 30.0–36.0)
MCV: 89.3 fL (ref 80.0–100.0)
Monocytes Absolute: 0.7 10*3/uL (ref 0.1–1.0)
Monocytes Relative: 10 %
Neutro Abs: 4.3 10*3/uL (ref 1.7–7.7)
Neutrophils Relative %: 61 %
Platelets: 231 10*3/uL (ref 150–400)
RBC: 4.29 MIL/uL (ref 3.87–5.11)
RDW: 12.4 % (ref 11.5–15.5)
WBC: 7.1 10*3/uL (ref 4.0–10.5)
nRBC: 0 % (ref 0.0–0.2)

## 2019-05-03 LAB — SEDIMENTATION RATE: Sed Rate: 35 mm/hr — ABNORMAL HIGH (ref 0–30)

## 2019-05-03 NOTE — Patient Instructions (Addendum)
1.  We will do blood test to evaluate for inflammation that may be affecting your lung.  2.  We will repeat a CT scan of the chest towards the latter part of October.  3.  We will order breathing tests.  4.  We will see you in follow-up after the breathing tests are done.

## 2019-05-03 NOTE — Telephone Encounter (Signed)
error 

## 2019-05-03 NOTE — Progress Notes (Signed)
Subjective:    Patient ID: Kimberly Rojas, female    DOB: 1945-12-22, 73 y.o.   MRN: [id number]  HPI Patient is a 73 year old lifelong never smoker who presents for follow-up on a cough that at the time of first evaluation in April had been present for 2 months.  She was last seen here on 2 July.   At that time cough had abated and dyspnea only occurred with episodes of anxiety.  She has had careful attention to management of gastroesophageal reflux with pantoprazole and antireflux measures and she believes that this helped resolve cough.  Today she has no complaint of dyspnea episodic or otherwise. We had ordered a CT scan to evaluate her chronic cough this was done in April 2020 she had no acute CT finding at the time she had very minimal changes of subpleural fibrosis which was nonspecific.  Subsequently on 21 May she presented to the emergency room with a complaint of tachycardia and shortness of breath, she was noted to have a d-dimer in the 600 range and underwent CT angiography my review of that film is that the patient had mild volume overload there was no pulmonary embolus noted there were some minimal nodular opacities noted in the right middle lobe these were new from April and I suspect that these are related to possible an episode of aspiration from reflux.  Since that evaluation on the emergency room in May, she has not had any further difficulty.  She does not seem to be anxious today.  She appears calm and cooperative.  She underwent a 2D echo in July 2020 that was essentially normal.  She has not had any fevers, chills or sweats and voices no other complaint.   Review of Systems  Constitutional: Negative.   HENT: Negative.   Eyes: Negative.   Respiratory: Negative.   Cardiovascular: Negative.   Gastrointestinal: Negative.        + reflux  Endocrine: Negative.   Genitourinary: Negative.   Musculoskeletal: Positive for arthralgias (Mostly in the hands.).  Skin: Negative.    Allergic/Immunologic: Negative.   Neurological: Negative.   Hematological: Negative.   Psychiatric/Behavioral: The patient is nervous/anxious (Better controlled.).   All other systems reviewed and are negative.      Objective:   Physical Exam Vitals signs and nursing note reviewed.  Constitutional:      General: She is not in acute distress.    Appearance: Normal appearance. She is normal weight. She is not ill-appearing.  HENT:     Head: Normocephalic and atraumatic.     Right Ear: External ear normal.     Left Ear: External ear normal.     Nose:     Comments: Nose/mouth/throat not examined due to masking requirements for COVID 19. Eyes:     General: No scleral icterus.    Conjunctiva/sclera: Conjunctivae normal.     Pupils: Pupils are equal, round, and reactive to light.  Neck:     Musculoskeletal: Neck supple.     Thyroid: No thyromegaly.     Trachea: Trachea and phonation normal.  Cardiovascular:     Rate and Rhythm: Normal rate and regular rhythm.     Pulses: Normal pulses.     Heart sounds: Normal heart sounds.  Pulmonary:     Effort: Pulmonary effort is normal.     Breath sounds: Normal breath sounds and air entry. No decreased breath sounds.  Musculoskeletal:     Right lower leg: No edema.  Left lower leg: No edema.     Comments: OA changes both hands  Lymphadenopathy:     Cervical: No cervical adenopathy.  Skin:    General: Skin is warm and dry.  Neurological:     General: No focal deficit present.     Mental Status: She is alert and oriented to person, place, and time.     Motor: Motor function is intact.     Coordination: Coordination is intact.     Gait: Gait is intact.  Psychiatric:        Mood and Affect: Mood and affect normal. Mood is not anxious.           Assessment & Plan:  1.  Abnormal findings CT scan of the chest: We will be repeating CT scan of the chest towards the latter part of October.  Appears that she may have had an issue  with aspiration pneumonitis on her last CT noted in May.  Having said that however she did have some mild fibrosis noted on initial CT.  Will evaluate for potential development of Interstitial Lung Disease, will also evaluate for potential underlying connective tissue disease and or rheumatoid arthritis.  She does have evidence of osteoarthritis but sometimes rheumatoid arthritis can coexist.  Will obtain sed rate, ANA, rheumatoid factor and CBC today.  2.  Shortness of breath/dyspnea:  This issue has improved with management of her anxiety but given her changes on her CT scan of the chest will proceed with obtaining PFTs as well.  3.  Gastroesophageal reflux disease: Patient has been advised to continue antireflux measures as well as continue Protonix.  She has been doing well with this regimen.  4.  Anxiety: This issue adds complexity to her management and appears to be well compensated at present.  Management as per primary care.   We will see the patient in follow-up after her pulmonary function testing and CT scan of the chest are done.  She is to contact us prior to that time should any new difficulties arise.    This chart was dictated using voice recognition software/Dragon.  Despite best efforts to proofread, errors can occur which can change the meaning.  Any change was purely unintentional.

## 2019-05-04 ENCOUNTER — Encounter: Payer: Self-pay | Admitting: Pulmonary Disease

## 2019-05-04 DIAGNOSIS — H35361 Drusen (degenerative) of macula, right eye: Secondary | ICD-10-CM | POA: Diagnosis not present

## 2019-05-04 DIAGNOSIS — H43813 Vitreous degeneration, bilateral: Secondary | ICD-10-CM | POA: Diagnosis not present

## 2019-05-04 DIAGNOSIS — T1512XA Foreign body in conjunctival sac, left eye, initial encounter: Secondary | ICD-10-CM | POA: Diagnosis not present

## 2019-05-04 DIAGNOSIS — H2513 Age-related nuclear cataract, bilateral: Secondary | ICD-10-CM | POA: Diagnosis not present

## 2019-05-04 LAB — ANA W/REFLEX: Anti Nuclear Antibody (ANA): NEGATIVE

## 2019-05-04 LAB — RHEUMATOID FACTOR: Rheumatoid fact SerPl-aCnc: <10 IU/mL (ref 0.0–13.9)

## 2019-05-11 ENCOUNTER — Encounter
Admission: RE | Admit: 2019-05-11 | Discharge: 2019-05-11 | Disposition: A | Discharge status: Home / Self Care | Payer: Medicare Other | Source: Ambulatory Visit | Attending: Surgery | Admitting: Surgery

## 2019-05-11 ENCOUNTER — Other Ambulatory Visit: Payer: Self-pay

## 2019-05-11 DIAGNOSIS — Z20828 Contact with and (suspected) exposure to other viral communicable diseases: Secondary | ICD-10-CM | POA: Diagnosis not present

## 2019-05-11 DIAGNOSIS — Z01812 Encounter for preprocedural laboratory examination: Secondary | ICD-10-CM | POA: Insufficient documentation

## 2019-05-11 HISTORY — DX: Nontoxic single thyroid nodule: E04.1

## 2019-05-11 HISTORY — DX: Anxiety disorder, unspecified: F41.9

## 2019-05-11 HISTORY — DX: Gastro-esophageal reflux disease without esophagitis: K21.9

## 2019-05-11 HISTORY — DX: Dyspnea, unspecified: R06.00

## 2019-05-11 NOTE — Patient Instructions (Signed)
Your procedure is scheduled on: 9/15.2020  Tues Report to Same Day Surgery 2nd floor medical mall [facility](Medical Mall Entrance-take elevator on left to 2nd floor.  Check in with surgery information desk.) To find out your arrival time please call [phone] between 1PM - 3PM on 05/17/2019 Mon  Remember: Instructions that are not followed completely may result in serious medical risk, up to and including death, or upon the discretion of your surgeon and anesthesiologist your surgery may need to be rescheduled.    _x___ 1. Do not eat food after midnight the night before your procedure. You may drink clear liquids up to 2 hours before you are scheduled to arrive at the hospital for your procedure.  Do not drink clear liquids within 2 hours of your scheduled arrival to the hospital.  Clear liquids include  --Water or Apple juice without pulp  --Clear carbohydrate beverage such as ClearFast or Gatorade  --Black Coffee or Clear Tea (No milk, no creamers, do not add anything to                  the coffee or Tea Type 1 and type 2 diabetics should only drink water.   ____Ensure clear carbohydrate drink on the way to the hospital for bariatric patients  ____Ensure clear carbohydrate drink 3 hours before surgery.   No gum chewing or hard candies.     __x__ 2. No Alcohol for 24 hours before or after surgery.   __x__3. No Smoking or e-cigarettes for 24 prior to surgery.  Do not use any chewable tobacco products for at least 6 hour prior to surgery   ____  4. Bring all medications with you on the day of surgery if instructed.    __x__ 5. Notify your doctor if there is any change in your medical condition     (cold, fever, infections).    x___6. On the morning of surgery brush your teeth with toothpaste and water.  You may rinse your mouth with mouth wash if you wish.  Do not swallow any toothpaste or mouthwash.   Do not wear jewelry, make-up, hairpins, clips or nail polish.  Do not wear lotions,  powders, or perfumes. You may wear deodorant.  Do not shave 48 hours prior to surgery. Men may shave face and neck.  Do not bring valuables to the hospital.    [facility] is not responsible for any belongings or valuables.               Contacts, dentures or bridgework may not be worn into surgery.  Leave your suitcase in the car. After surgery it may be brought to your room.  For patients admitted to the hospital, discharge time is determined by your                       treatment team.  _  Patients discharged the day of surgery will not be allowed to drive home.  You will need someone to drive you home and stay with you the night of your procedure.    Please read over the following fact sheets that you were given:   [facility] Preparing for Surgery and or MRSA Information   _x___ Take anti-hypertensive listed below, cardiac, seizure, asthma,     anti-reflux and psychiatric medicines. These include:  1. ALPRAZolam (XANAX) 0.25 MG tablet as needed the morning of surgery  2.busPIRone (BUSPAR) 7.5 MG tablet as needed the morning of surgery  3.pantoprazole (  PROTONIX) 40 MG tablet  4.  5.  6.  ____Fleets enema or Magnesium Citrate as directed.   _x___ Use CHG Soap or sage wipes as directed on instruction sheet   ____ Use inhalers on the day of surgery and bring to hospital day of surgery  ____ Stop Metformin and Janumet 2 days prior to surgery.    ____ Take 1/2 of usual insulin dose the night before surgery and none on the morning     surgery.   _x___ Follow recommendations from Cardiologist, Pulmonologist or PCP regarding          stopping Aspirin, Coumadin, Plavix ,Eliquis, Effient, or Pradaxa, and Pletal.  X____Stop Anti-inflammatories such as Advil, Aleve, Ibuprofen, Motrin, Naproxen, Naprosyn, Goodies powders or aspirin products. OK to take Tylenol and                          Celebrex.   _x___ Stop supplements until after surgery.  But may continue Vitamin D, Vitamin B,        and multivitamin.   ____ Bring C-Pap to the hospital.

## 2019-05-14 ENCOUNTER — Other Ambulatory Visit: Payer: Self-pay

## 2019-05-14 ENCOUNTER — Other Ambulatory Visit
Admission: RE | Admit: 2019-05-14 | Discharge: 2019-05-14 | Disposition: A | Discharge status: Home / Self Care | Payer: Medicare Other | Source: Ambulatory Visit | Attending: Surgery | Admitting: Surgery

## 2019-05-14 DIAGNOSIS — Z01812 Encounter for preprocedural laboratory examination: Secondary | ICD-10-CM | POA: Diagnosis not present

## 2019-05-15 LAB — SARS CORONAVIRUS 2 (TAT 6-24 HRS): SARS Coronavirus 2: NEGATIVE

## 2019-05-18 ENCOUNTER — Ambulatory Visit
Admission: RE | Admit: 2019-05-18 | Discharge: 2019-05-18 | Disposition: A | Discharge status: Home / Self Care | Payer: Medicare Other | Source: Ambulatory Visit | Attending: Surgery | Admitting: Surgery

## 2019-05-18 ENCOUNTER — Ambulatory Visit: Payer: Medicare Other | Admitting: Anesthesiology

## 2019-05-18 ENCOUNTER — Encounter: Payer: Self-pay | Admitting: *Deleted

## 2019-05-18 ENCOUNTER — Encounter
Admission: RE | Disposition: A | Discharge status: Home / Self Care | Payer: Self-pay | Source: Ambulatory Visit | Attending: Surgery

## 2019-05-18 ENCOUNTER — Other Ambulatory Visit: Payer: Self-pay

## 2019-05-18 DIAGNOSIS — E039 Hypothyroidism, unspecified: Secondary | ICD-10-CM | POA: Diagnosis not present

## 2019-05-18 DIAGNOSIS — J449 Chronic obstructive pulmonary disease, unspecified: Secondary | ICD-10-CM | POA: Diagnosis not present

## 2019-05-18 DIAGNOSIS — I10 Essential (primary) hypertension: Secondary | ICD-10-CM | POA: Diagnosis not present

## 2019-05-18 DIAGNOSIS — R9389 Abnormal findings on diagnostic imaging of other specified body structures: Secondary | ICD-10-CM | POA: Insufficient documentation

## 2019-05-18 DIAGNOSIS — M1712 Unilateral primary osteoarthritis, left knee: Secondary | ICD-10-CM | POA: Diagnosis not present

## 2019-05-18 DIAGNOSIS — M94262 Chondromalacia, left knee: Secondary | ICD-10-CM | POA: Diagnosis not present

## 2019-05-18 DIAGNOSIS — R0602 Shortness of breath: Secondary | ICD-10-CM | POA: Insufficient documentation

## 2019-05-18 DIAGNOSIS — S83272A Complex tear of lateral meniscus, current injury, left knee, initial encounter: Secondary | ICD-10-CM | POA: Diagnosis not present

## 2019-05-18 DIAGNOSIS — X58XXXA Exposure to other specified factors, initial encounter: Secondary | ICD-10-CM | POA: Insufficient documentation

## 2019-05-18 DIAGNOSIS — F419 Anxiety disorder, unspecified: Secondary | ICD-10-CM | POA: Insufficient documentation

## 2019-05-18 DIAGNOSIS — K219 Gastro-esophageal reflux disease without esophagitis: Secondary | ICD-10-CM | POA: Insufficient documentation

## 2019-05-18 HISTORY — DX: Cardiac arrhythmia, unspecified: I49.9

## 2019-05-18 HISTORY — PX: KNEE ARTHROSCOPY WITH LATERAL MENISECTOMY: SHX6193

## 2019-05-18 SURGERY — ARTHROSCOPY, KNEE, WITH LATERAL MENISCECTOMY
Anesthesia: General | Site: Knee | Laterality: Left

## 2019-05-18 MED ORDER — METOCLOPRAMIDE HCL 10 MG PO TABS: 5.0000 mg | ORAL_TABLET | Freq: Three times a day (TID) | ORAL | Status: DC | PRN

## 2019-05-18 MED ORDER — ONDANSETRON HCL 4 MG/2ML IJ SOLN: 4.0000 mg | Freq: Once | INTRAMUSCULAR | Status: DC | PRN

## 2019-05-18 MED ORDER — GLYCOPYRROLATE 0.2 MG/ML IJ SOLN
INTRAMUSCULAR | Status: DC | PRN
  Administered 2019-05-18: 0.2 mg via INTRAVENOUS

## 2019-05-18 MED ORDER — SEVOFLURANE IN SOLN
RESPIRATORY_TRACT | Status: AC
  Filled 2019-05-18: qty 250

## 2019-05-18 MED ORDER — HYDROCODONE-ACETAMINOPHEN 5-325 MG PO TABS: 1.0000 | ORAL_TABLET | ORAL | 0 refills | Status: DC | PRN

## 2019-05-18 MED ORDER — MIDAZOLAM HCL 2 MG/2ML IJ SOLN
INTRAMUSCULAR | Status: DC | PRN
  Administered 2019-05-18: 2 mg via INTRAVENOUS

## 2019-05-18 MED ORDER — LACTATED RINGERS IV SOLN
INTRAVENOUS | Status: DC
  Administered 2019-05-18: 20 mL/h via INTRAVENOUS

## 2019-05-18 MED ORDER — LIDOCAINE HCL (CARDIAC) PF 100 MG/5ML IV SOSY
PREFILLED_SYRINGE | INTRAVENOUS | Status: DC | PRN
  Administered 2019-05-18: 100 mg via INTRAVENOUS

## 2019-05-18 MED ORDER — FENTANYL CITRATE (PF) 100 MCG/2ML IJ SOLN
INTRAMUSCULAR | Status: DC | PRN
  Administered 2019-05-18: 50 ug via INTRAVENOUS

## 2019-05-18 MED ORDER — ACETAMINOPHEN 10 MG/ML IV SOLN
INTRAVENOUS | Status: DC | PRN
  Administered 2019-05-18: 1000 mg via INTRAVENOUS

## 2019-05-18 MED ORDER — LIDOCAINE HCL 1 % IJ SOLN
INTRAMUSCULAR | Status: DC | PRN
  Administered 2019-05-18: 30 mL

## 2019-05-18 MED ORDER — SODIUM CHLORIDE (PF) 0.9 % IJ SOLN
INTRAMUSCULAR | Status: AC
  Filled 2019-05-18: qty 10

## 2019-05-18 MED ORDER — PROPOFOL 10 MG/ML IV BOLUS
INTRAVENOUS | Status: AC
  Filled 2019-05-18: qty 20

## 2019-05-18 MED ORDER — PROPOFOL 10 MG/ML IV BOLUS
INTRAVENOUS | Status: DC | PRN
  Administered 2019-05-18: 100 mg via INTRAVENOUS

## 2019-05-18 MED ORDER — ONDANSETRON HCL 4 MG/2ML IJ SOLN: 4.0000 mg | Freq: Four times a day (QID) | INTRAMUSCULAR | Status: DC | PRN

## 2019-05-18 MED ORDER — ONDANSETRON HCL 4 MG/2ML IJ SOLN
INTRAMUSCULAR | Status: DC | PRN
  Administered 2019-05-18: 4 mg via INTRAVENOUS

## 2019-05-18 MED ORDER — ONDANSETRON HCL 4 MG PO TABS: 4.0000 mg | ORAL_TABLET | Freq: Four times a day (QID) | ORAL | Status: DC | PRN

## 2019-05-18 MED ORDER — ONDANSETRON HCL 4 MG/2ML IJ SOLN
INTRAMUSCULAR | Status: AC
  Filled 2019-05-18: qty 2

## 2019-05-18 MED ORDER — PHENYLEPHRINE HCL (PRESSORS) 10 MG/ML IV SOLN
INTRAVENOUS | Status: DC | PRN
  Administered 2019-05-18: 100 ug via INTRAVENOUS

## 2019-05-18 MED ORDER — LIDOCAINE HCL (PF) 2 % IJ SOLN
INTRAMUSCULAR | Status: AC
  Filled 2019-05-18: qty 10

## 2019-05-18 MED ORDER — BUPIVACAINE-EPINEPHRINE (PF) 0.5% -1:200000 IJ SOLN
INTRAMUSCULAR | Status: DC | PRN
  Administered 2019-05-18: 30 mL
  Administered 2019-05-18: 20 mL

## 2019-05-18 MED ORDER — EPHEDRINE SULFATE 50 MG/ML IJ SOLN
INTRAMUSCULAR | Status: AC
  Filled 2019-05-18: qty 1

## 2019-05-18 MED ORDER — EPHEDRINE SULFATE 50 MG/ML IJ SOLN
INTRAMUSCULAR | Status: DC | PRN
  Administered 2019-05-18: 10 mg via INTRAVENOUS

## 2019-05-18 MED ORDER — METOCLOPRAMIDE HCL 5 MG/ML IJ SOLN: 5.0000 mg | Freq: Three times a day (TID) | INTRAMUSCULAR | Status: DC | PRN

## 2019-05-18 MED ORDER — FENTANYL CITRATE (PF) 100 MCG/2ML IJ SOLN: 25.0000 ug | INTRAMUSCULAR | Status: DC | PRN

## 2019-05-18 MED ORDER — POTASSIUM CHLORIDE IN NACL 20-0.9 MEQ/L-% IV SOLN: INTRAVENOUS | Status: DC

## 2019-05-18 MED ORDER — ACETAMINOPHEN 10 MG/ML IV SOLN
INTRAVENOUS | Status: AC
  Filled 2019-05-18: qty 100

## 2019-05-18 MED ORDER — CEFAZOLIN SODIUM-DEXTROSE 2-4 GM/100ML-% IV SOLN
INTRAVENOUS | Status: AC
  Filled 2019-05-18: qty 100

## 2019-05-18 MED ORDER — GLYCOPYRROLATE 0.2 MG/ML IJ SOLN
INTRAMUSCULAR | Status: AC
  Filled 2019-05-18: qty 1

## 2019-05-18 MED ORDER — MIDAZOLAM HCL 2 MG/2ML IJ SOLN
INTRAMUSCULAR | Status: AC
  Filled 2019-05-18: qty 2

## 2019-05-18 MED ORDER — FENTANYL CITRATE (PF) 250 MCG/5ML IJ SOLN
INTRAMUSCULAR | Status: AC
  Filled 2019-05-18: qty 5

## 2019-05-18 MED ORDER — CEFAZOLIN SODIUM-DEXTROSE 2-4 GM/100ML-% IV SOLN
2.0000 g | Freq: Once | INTRAVENOUS | Status: AC
  Administered 2019-05-18: 11:00:00 2 g via INTRAVENOUS

## 2019-05-18 SURGICAL SUPPLY — 38 items
"PENCIL ELECTRO HAND CTR " (MISCELLANEOUS) ×1 IMPLANT
BAG COUNTER SPONGE EZ (MISCELLANEOUS) IMPLANT
BLADE FULL RADIUS 3.5 (BLADE) ×3 IMPLANT
BLADE SHAVER 4.5X7 STR FR (MISCELLANEOUS) ×3 IMPLANT
BNDG ELASTIC 6X5.8 VLCR STR LF (GAUZE/BANDAGES/DRESSINGS) ×3 IMPLANT
CHLORAPREP W/TINT 26 (MISCELLANEOUS) ×3 IMPLANT
COUNTER SPONGE BAG EZ (MISCELLANEOUS)
COVER WAND RF STERILE (DRAPES) ×3 IMPLANT
CUFF TOURN SGL QUICK 24 (TOURNIQUET CUFF) ×2
CUFF TOURN SGL QUICK 30 (TOURNIQUET CUFF)
CUFF TRNQT CYL 24X4X16.5-23 (TOURNIQUET CUFF) IMPLANT
CUFF TRNQT CYL 30X4X21-28X (TOURNIQUET CUFF) IMPLANT
DRAPE SPLIT 6X30 W/TAPE (DRAPES) ×3 IMPLANT
ELECT REM PT RETURN 9FT ADLT (ELECTROSURGICAL) ×3
ELECTRODE REM PT RTRN 9FT ADLT (ELECTROSURGICAL) ×1 IMPLANT
GAUZE SPONGE 4X4 12PLY STRL (GAUZE/BANDAGES/DRESSINGS) ×3 IMPLANT
GLOVE BIO SURGEON STRL SZ8 (GLOVE) ×6 IMPLANT
GLOVE BIOGEL M 7.0 STRL (GLOVE) ×10 IMPLANT
GLOVE BIOGEL PI IND STRL 7.5 (GLOVE) ×1 IMPLANT
GLOVE BIOGEL PI INDICATOR 7.5 (GLOVE) ×4
GLOVE INDICATOR 8.0 STRL GRN (GLOVE) ×3 IMPLANT
GOWN STRL REUS W/ TWL LRG LVL3 (GOWN DISPOSABLE) ×1 IMPLANT
GOWN STRL REUS W/ TWL XL LVL3 (GOWN DISPOSABLE) ×2 IMPLANT
GOWN STRL REUS W/TWL LRG LVL3 (GOWN DISPOSABLE) ×2
GOWN STRL REUS W/TWL XL LVL3 (GOWN DISPOSABLE) ×4
IV LACTATED RINGER IRRG 3000ML (IV SOLUTION) ×2
IV LR IRRIG 3000ML ARTHROMATIC (IV SOLUTION) ×1 IMPLANT
KIT TURNOVER KIT A (KITS) ×3 IMPLANT
MANIFOLD NEPTUNE II (INSTRUMENTS) ×3 IMPLANT
NDL HYPO 21X1.5 SAFETY (NEEDLE) ×1 IMPLANT
NEEDLE HYPO 21X1.5 SAFETY (NEEDLE) ×3 IMPLANT
PACK ARTHROSCOPY KNEE (MISCELLANEOUS) ×3 IMPLANT
PENCIL ELECTRO HAND CTR (MISCELLANEOUS) ×3 IMPLANT
SUT PROLENE 4 0 PS 2 18 (SUTURE) ×3 IMPLANT
SUT TICRON COATED BLUE 2 0 30 (SUTURE) IMPLANT
SYR 50ML LL SCALE MARK (SYRINGE) ×3 IMPLANT
TUBING ARTHRO INFLOW-ONLY STRL (TUBING) ×3 IMPLANT
WAND WEREWOLF FLOW 90D (MISCELLANEOUS) ×3 IMPLANT

## 2019-05-18 NOTE — Transfer of Care (Signed)
Immediate Anesthesia Transfer of Care Note  Patient: Kimberly Rojas  Procedure(s) Performed: KNEE ARTHROSCOPY WITH DEBRIDEMENT AND PARTIAL  LATERAL MENISECTOMY (Left Knee)  Patient Location: PACU  Anesthesia Type:General  Level of Consciousness: awake, alert  and oriented  Airway & Oxygen Therapy: Patient Spontanous Breathing and Patient connected to nasal cannula oxygen  Post-op Assessment: Report given to RN and Post -op Vital signs reviewed and stable  Post vital signs: Reviewed and stable  Last Vitals:  Vitals Value Taken Time  BP    Temp    Pulse 118 05/18/19 1156  Resp    SpO2 98 % 05/18/19 1156  Vitals shown include unvalidated device data.  Last Pain:  Vitals:   05/18/19 0839  TempSrc: Tympanic  PainSc: 5       Patients Stated Pain Goal: 1 (12/87/86 7672)  Complications: No apparent anesthesia complications

## 2019-05-18 NOTE — Anesthesia Post-op Follow-up Note (Signed)
Anesthesia QCDR form completed.        

## 2019-05-18 NOTE — Anesthesia Postprocedure Evaluation (Signed)
Anesthesia Post Note  Patient: Kimberly Rojas  Procedure(s) Performed: KNEE ARTHROSCOPY WITH DEBRIDEMENT AND PARTIAL  LATERAL MENISECTOMY (Left Knee)  Patient location during evaluation: PACU Anesthesia Type: General Level of consciousness: awake and alert Pain management: pain level controlled Vital Signs Assessment: post-procedure vital signs reviewed and stable Respiratory status: spontaneous breathing and respiratory function stable Cardiovascular status: stable Anesthetic complications: no     Last Vitals:  Vitals:   05/18/19 1203 05/18/19 1216  BP:  116/63  Pulse: (!) 107 (!) 102  Resp: 17 16  Temp:    SpO2: 98% 98%    Last Pain:  Vitals:   05/18/19 1216  TempSrc:   PainSc: 0-No pain                 KEPHART,WILLIAM K

## 2019-05-18 NOTE — Op Note (Signed)
05/18/2019  11:59 AM  Patient:   Kimberly Rojas  Pre-Op Diagnosis:   Complex tear of lateral meniscus with underlying degenerative joint disease, left knee.  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  Surgeon:   Pascal Lux, M.D.  Anesthesia:   General LMA.  Findings:   As above.  There were grade 4 chondromalacial changes involving the posterior lateral portion of the lateral tibial plateau, grade 3 chondromalacial changes involving the lateral femoral condyle and patella, and grade 2 chondromalacial changes involving the medial femoral condyle.  The medial meniscus was in satisfactory condition, as were the anterior and posterior cruciate ligaments.  Complications:   None.  EBL:   2 cc.  Total fluids:   700 cc of crystalloid.  Tourniquet time:   None  Drains:   None  Closure:   4-0 Prolene interrupted sutures.  Brief clinical note:   The patient is a 73 year old female with a several month history of lateral and posterior left knee pain. Her symptoms have persisted despite medications, activity modification, etc. Her history and examination consistent with a lateral meniscus tear, confirmed by MRI scan. The MRI scan also demonstrated evidence of degenerative joint disease. The patient presents at this time for arthroscopy, debridement, and partial lateral meniscectomy.  Procedure:   The patient was brought into the operating room and lain in the supine position. After adequate general laryngeal mask anesthesia was obtained, a timeout was performed to verify the appropriate side. The patient's left knee was injected sterilely using a solution of 30 cc of 1% lidocaine and 30 cc of 0.5% Sensorcaine with epinephrine. The left lower extremity was prepped with ChloraPrep solution before being draped sterilely. Preoperative  antibiotics were administered. The expected portal sites were injected with 0.5% Sensorcaine with epinephrine before the camera was placed in the anterolateral portal and instrumentation performed through the anteromedial portal.   The knee was sequentially examined beginning in the suprapatellar pouch, then progressing to the patellofemoral space, the medial gutter and compartment, the notch, and finally the lateral compartment and gutter. The findings were as described above. Abundant reactive synovial tissues anteriorly were debrided using the full-radius resector in order to improve visualization. The medial meniscus was probed and found to be intact. Laterally, there was extensive tearing of the anterior and middle portions of the meniscus, including a primarily horizontal cleavage tear. The torn meniscal tissue was too fibrinous to repair so the loose portions were debrided back to stable margins using the full-radius resector and small baskets. Anterolaterally, the meniscus was trimmed back using the side-biting baskets and full-radius resector. Subsequent probing of the remaining rim demonstrated excellent stability. Areas of grade III chondromalacia involving the lateral femoral condyle and patella also were debrided back to stable margins using the full-radius resector. The instruments were removed from the joint after suctioning the excess fluid.   The portal sites were closed using 4-0 Prolene interrupted sutures before a sterile bulky dressing was applied to the knee. The patient was then awakened, extubated, and returned to the recovery room in satisfactory condition after tolerating the procedure well.

## 2019-05-18 NOTE — Discharge Instructions (Addendum)
Orthopedic discharge instructions: Keep dressing dry and intact.  May shower after dressing changed on post-op day #4 (Saturday).  Cover sutures with Band-Aids after drying off. Apply ice frequently to knee. Take pain medication as prescribed or ES Tylenol when needed.  May weight-bear as tolerated - use crutches or walker as needed. Follow-up in 10-14 days or as scheduled.    AMBULATORY SURGERY  DISCHARGE INSTRUCTIONS   1) The drugs that you were given will stay in your system until tomorrow so for the next 24 hours you should not:  A) Drive an automobile B) Make any legal decisions C) Drink any alcoholic beverage   2) You may resume regular meals tomorrow.  Today it is better to start with liquids and gradually work up to solid foods.  You may eat anything you prefer, but it is better to start with liquids, then soup and crackers, and gradually work up to solid foods.   3) Please notify your doctor immediately if you have any unusual bleeding, trouble breathing, redness and pain at the surgery site, drainage, fever, or pain not relieved by medication.    4) Additional Instructions:        Please contact your physician with any problems or Same Day Surgery at [phone], Monday through Friday 6 am to 4 pm, or [place] at [facility] number at [phone].

## 2019-05-18 NOTE — H&P (Signed)
Paper H&P to be scanned into permanent record. H&P reviewed and patient re-examined. No changes. 

## 2019-05-18 NOTE — Anesthesia Preprocedure Evaluation (Signed)
Anesthesia Evaluation  Patient identified by MRN, date of birth, ID band Patient awake    Reviewed: Allergy & Precautions, NPO status , Patient's Chart, lab work & pertinent test results  History of Anesthesia Complications Negative for: history of anesthetic complications  Airway Mallampati: II       Dental   Pulmonary neg sleep apnea, COPD,  COPD inhaler, Not current smoker,           Cardiovascular hypertension, (-) Past MI + dysrhythmias (tachycardia, occassional, on prn diltiazem) + Valvular Problems/Murmurs      Neuro/Psych neg Seizures Anxiety    GI/Hepatic Neg liver ROS, hiatal hernia, PUD, GERD  Medicated,  Endo/Other  neg diabetesHypothyroidism   Renal/GU negative Renal ROS     Musculoskeletal   Abdominal   Peds  Hematology   Anesthesia Other Findings   Reproductive/Obstetrics                             Anesthesia Physical Anesthesia Plan  ASA: III  Anesthesia Plan: General   Post-op Pain Management:    Induction:   PONV Risk Score and Plan: 3 and Dexamethasone, Ondansetron and Treatment may vary due to age or medical condition  Airway Management Planned: LMA  Additional Equipment:   Intra-op Plan:   Post-operative Plan:   Informed Consent: I have reviewed the patients History and Physical, chart, labs and discussed the procedure including the risks, benefits and alternatives for the proposed anesthesia with the patient or authorized representative who has indicated his/her understanding and acceptance.       Plan Discussed with:   Anesthesia Plan Comments:         Anesthesia Quick Evaluation

## 2019-05-18 NOTE — Anesthesia Procedure Notes (Signed)
Procedure Name: LMA Insertion Date/Time: 05/18/2019 10:51 AM Performed by: Bernardo Heater, CRNA Pre-anesthesia Checklist: Patient identified, Patient being monitored, Timeout performed, Emergency Drugs available and Suction available Patient Re-evaluated:Patient Re-evaluated prior to induction Oxygen Delivery Method: Circle system utilized Preoxygenation: Pre-oxygenation with 100% oxygen Induction Type: IV induction Ventilation: Mask ventilation without difficulty LMA: LMA inserted LMA Size: 4.0 Tube type: Oral Number of attempts: 1 Placement Confirmation: positive ETCO2 and breath sounds checked- equal and bilateral Tube secured with: Tape Dental Injury: Teeth and Oropharynx as per pre-operative assessment

## 2019-05-25 ENCOUNTER — Ambulatory Visit: Payer: Medicare Other

## 2019-06-01 DIAGNOSIS — M1712 Unilateral primary osteoarthritis, left knee: Secondary | ICD-10-CM | POA: Diagnosis not present

## 2019-06-11 ENCOUNTER — Ambulatory Visit (INDEPENDENT_AMBULATORY_CARE_PROVIDER_SITE_OTHER): Payer: Medicare Other

## 2019-06-11 ENCOUNTER — Other Ambulatory Visit: Payer: Self-pay

## 2019-06-11 DIAGNOSIS — Z23 Encounter for immunization: Secondary | ICD-10-CM | POA: Diagnosis not present

## 2019-06-17 DIAGNOSIS — M5416 Radiculopathy, lumbar region: Secondary | ICD-10-CM | POA: Diagnosis not present

## 2019-06-17 DIAGNOSIS — M48062 Spinal stenosis, lumbar region with neurogenic claudication: Secondary | ICD-10-CM | POA: Diagnosis not present

## 2019-06-17 DIAGNOSIS — M5136 Other intervertebral disc degeneration, lumbar region: Secondary | ICD-10-CM | POA: Diagnosis not present

## 2019-06-28 DIAGNOSIS — E041 Nontoxic single thyroid nodule: Secondary | ICD-10-CM | POA: Diagnosis not present

## 2019-06-28 DIAGNOSIS — R946 Abnormal results of thyroid function studies: Secondary | ICD-10-CM | POA: Diagnosis not present

## 2019-06-29 IMAGING — DX PORTABLE CHEST - 1 VIEW
1 series · 1 of 1 positions shown · non-contrast
Comparison: Chest radiograph December 23, 2018 and chest CT December 31, 2018.

CLINICAL DATA: Tachycardia and difficulty breathing

EXAM:
PORTABLE CHEST 1 VIEW

[chest ap]
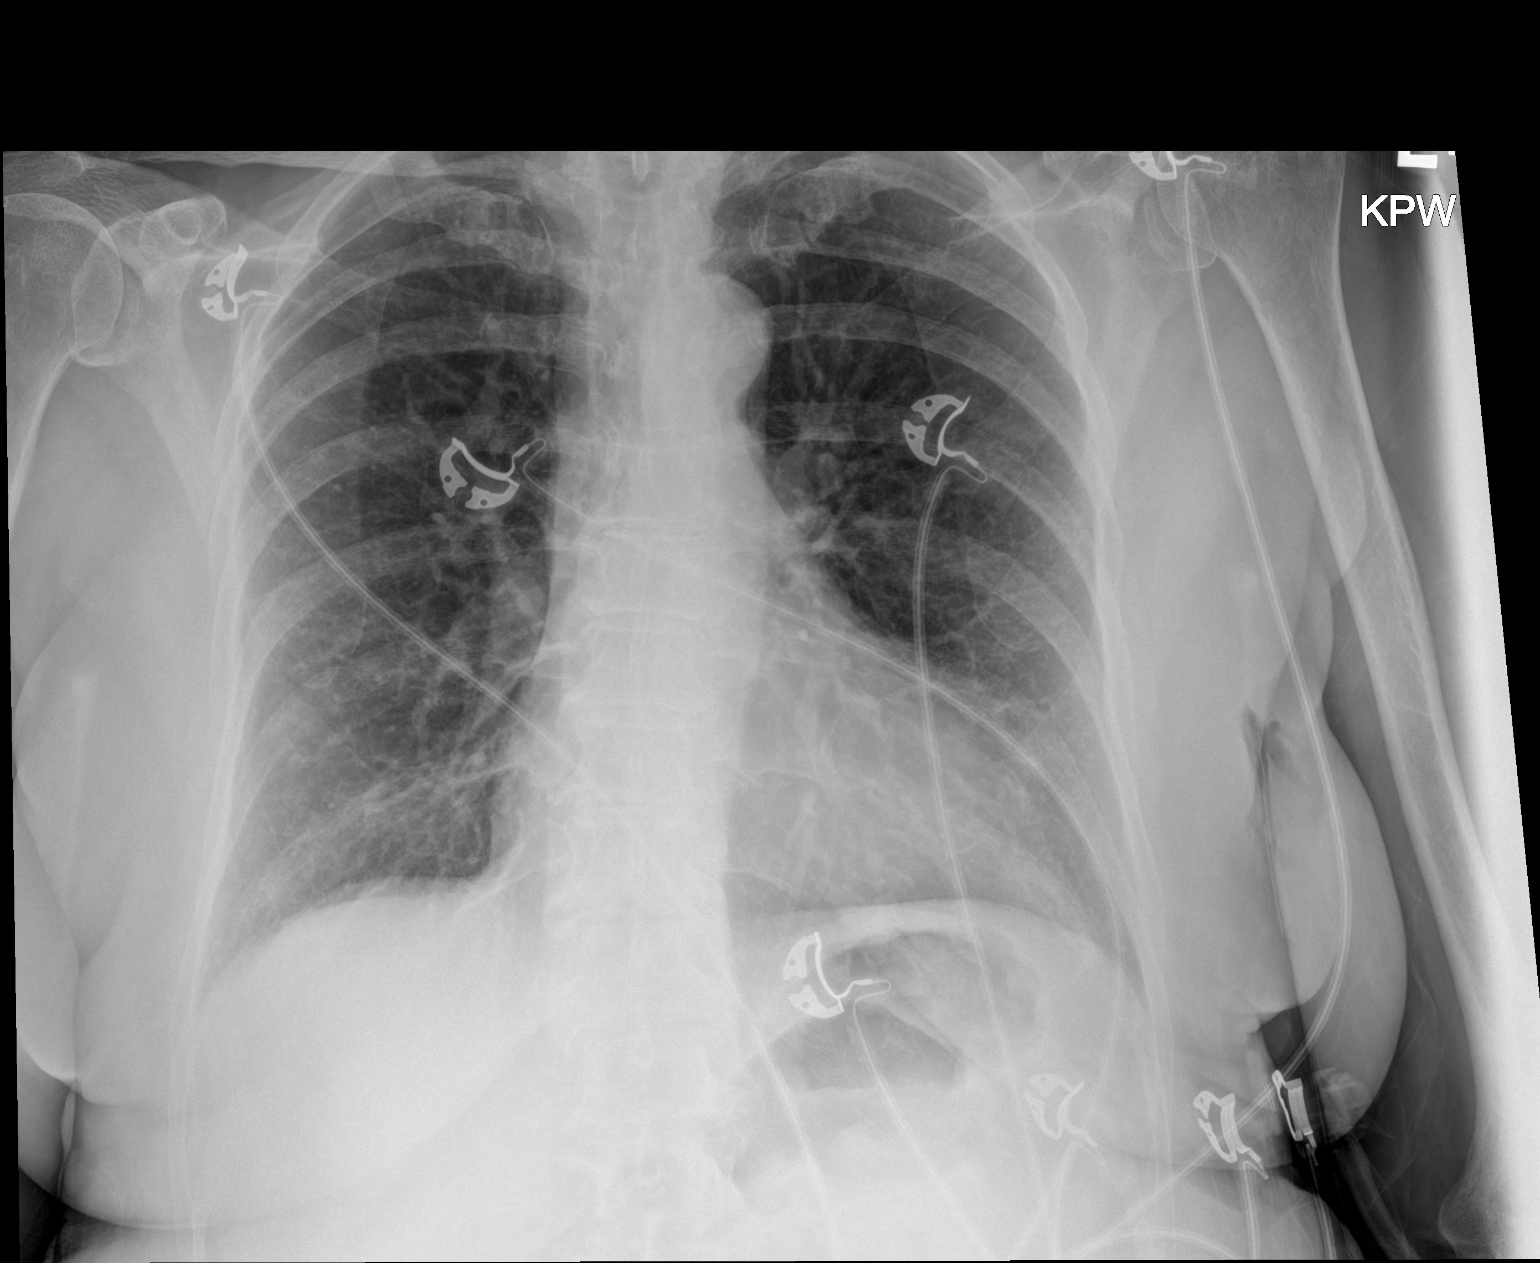

[1 of 1 positions shown; findings below may reference images not displayed]

FINDINGS: There is fibrosis in the right base region. There is no edema or
consolidation. Heart size and pulmonary vascularity are normal. No
adenopathy. No bone lesions.
IMPRESSION: Fibrosis right base region, better seen on recent CT. No edema or
consolidation. Stable cardiac silhouette.

## 2019-07-01 ENCOUNTER — Ambulatory Visit
Admission: RE | Admit: 2019-07-01 | Discharge: 2019-07-01 | Disposition: A | Discharge status: Home / Self Care | Payer: Medicare Other | Source: Ambulatory Visit | Attending: Pulmonary Disease | Admitting: Pulmonary Disease

## 2019-07-01 ENCOUNTER — Other Ambulatory Visit: Payer: Self-pay

## 2019-07-01 ENCOUNTER — Telehealth: Payer: Self-pay | Admitting: Pulmonary Disease

## 2019-07-01 DIAGNOSIS — R918 Other nonspecific abnormal finding of lung field: Secondary | ICD-10-CM

## 2019-07-01 DIAGNOSIS — J849 Interstitial pulmonary disease, unspecified: Secondary | ICD-10-CM | POA: Diagnosis not present

## 2019-07-01 NOTE — Telephone Encounter (Signed)
Notes recorded by Tyler Pita, MD on 07/01/2019 at 1:14 PM EDT  CT shows improvement from her last CT performed. She has 2 small nodules that have not changed at all or have regressed since the last study. She will need CT in 6 months time. She has mild scarring of the lungs which is unchanged from prior.  pt is aware of results and voiced her understanding.,  Order has been placed for CT.  Nothing further is needed.

## 2019-07-02 DIAGNOSIS — M1712 Unilateral primary osteoarthritis, left knee: Secondary | ICD-10-CM | POA: Diagnosis not present

## 2019-07-02 DIAGNOSIS — S83272D Complex tear of lateral meniscus, current injury, left knee, subsequent encounter: Secondary | ICD-10-CM | POA: Diagnosis not present

## 2019-07-05 DIAGNOSIS — E041 Nontoxic single thyroid nodule: Secondary | ICD-10-CM | POA: Diagnosis not present

## 2019-07-05 DIAGNOSIS — R7989 Other specified abnormal findings of blood chemistry: Secondary | ICD-10-CM | POA: Diagnosis not present

## 2019-07-09 ENCOUNTER — Other Ambulatory Visit: Payer: Self-pay

## 2019-07-13 ENCOUNTER — Ambulatory Visit: Payer: Medicare Other | Admitting: Internal Medicine

## 2019-07-21 ENCOUNTER — Other Ambulatory Visit: Payer: Self-pay

## 2019-07-21 ENCOUNTER — Other Ambulatory Visit (INDEPENDENT_AMBULATORY_CARE_PROVIDER_SITE_OTHER): Payer: Medicare Other

## 2019-07-21 ENCOUNTER — Telehealth: Payer: Self-pay | Admitting: Internal Medicine

## 2019-07-21 DIAGNOSIS — R300 Dysuria: Secondary | ICD-10-CM | POA: Diagnosis not present

## 2019-07-21 LAB — POCT URINALYSIS DIPSTICK
Bilirubin, UA: NEGATIVE
Glucose, UA: NEGATIVE
Ketones, UA: NEGATIVE
Nitrite, UA: NEGATIVE
Protein, UA: NEGATIVE
Spec Grav, UA: 1.01 (ref 1.010–1.025)
Urobilinogen, UA: 0.2 E.U./dL
pH, UA: 5 (ref 5.0–8.0)

## 2019-07-21 LAB — URINALYSIS, MICROSCOPIC ONLY

## 2019-07-21 NOTE — Telephone Encounter (Signed)
Possible UTI, No available appts. Urine has strong order and a little burining.

## 2019-07-21 NOTE — Telephone Encounter (Signed)
Spoke with pt and she is coming by today to leave a urine sample and then is scheduled for a virtual visit with Dr. Derrel Nip on Friday at 11:30. Orders have been placed.

## 2019-07-23 ENCOUNTER — Encounter: Payer: Self-pay | Admitting: Internal Medicine

## 2019-07-23 ENCOUNTER — Ambulatory Visit (INDEPENDENT_AMBULATORY_CARE_PROVIDER_SITE_OTHER): Payer: Medicare Other | Admitting: Internal Medicine

## 2019-07-23 VITALS — Ht 61.0 in | Wt 125.0 lb

## 2019-07-23 DIAGNOSIS — Z1231 Encounter for screening mammogram for malignant neoplasm of breast: Secondary | ICD-10-CM | POA: Diagnosis not present

## 2019-07-23 DIAGNOSIS — R300 Dysuria: Secondary | ICD-10-CM | POA: Diagnosis not present

## 2019-07-23 LAB — URINE CULTURE
MICRO NUMBER:: 1114707
SPECIMEN QUALITY:: ADEQUATE

## 2019-07-23 NOTE — Progress Notes (Signed)
Virtual Visit via [email or web address]  This visit type was conducted due to national recommendations for restrictions regarding the COVID-19 pandemic (e.g. social distancing).  This format is felt to be most appropriate for this patient at this time.  All issues noted in this document were discussed and addressed.  No physical exam was performed (except for noted visual exam findings with Video Visits).   I connected with@ on 07/23/19 at 11:30 AM EST by a video enabled telemedicine application  and verified that I am speaking with the correct person using two identifiers. Location patient: home Location provider: work or home office Persons participating in the virtual visit: patient, provider  I discussed the limitations, risks, security and privacy concerns of performing an evaluation and management service by telephone and the availability of in person appointments. I also discussed with the patient that there may be a patient responsible charge related to this service. The patient expressed understanding and agreed to proceed.  Reason for visit: UTI symptoms.  Follow up  HPI:   73 yr old female with one day history of dark urine with strong odor and mild dysuria which started on Nov 18.  No recent prolonged travel,  No recent abx.  No recent sexual encounter.  Symptoms resolved after she increased her water intake. denied back pain , flank pain,  Suprapubic pain ,  Nausea  Chills and fever.   ROS: See pertinent positives and negatives per HPI.  Past Medical History:  Diagnosis Date  . Anxiety   . Arthritis   . Chicken pox   . Dyspnea    denies  . Dysrhythmia    tachy at times  . Fatigue   . GERD (gastroesophageal reflux disease)   . Hair loss   . Heart murmur    hx of   . Heartburn   . Hyperlipidemia   . Migraines    had many years ago, not currently  . Peptic ulcer   . Thyroid nodule     Past Surgical History:  Procedure Laterality Date  . BREAST EXCISIONAL BIOPSY Left 2005-2010    benign  . BREAST SURGERY     left breast biopsy   . CESAREAN SECTION    . CHOLECYSTECTOMY    . DILATION AND CURETTAGE OF UTERUS    . KNEE ARTHROSCOPY WITH LATERAL MENISECTOMY Left 05/18/2019   Procedure: KNEE ARTHROSCOPY WITH DEBRIDEMENT AND PARTIAL  LATERAL MENISECTOMY;  Surgeon: Corky Mull, MD;  Location: [facility] ORS;  Service: Orthopedics;  Laterality: Left;  . SHOULDER OPEN ROTATOR CUFF REPAIR Right 03/31/2013   Procedure: RIGHT ROTATOR CUFF REPAIR SHOULDER OPEN;  Surgeon: Tobi Bastos, MD;  Location: [facility];  Service: Orthopedics;  Laterality: Right;  With Patch and Anchor  . TONSILLECTOMY AND ADENOIDECTOMY      Family History  Problem Relation Age of Onset  . Breast cancer Mother 59  . Arthritis Mother   . Chronic fatigue Mother   . Diabetes Father   . Diabetes Paternal Grandfather     SOCIAL HX:  reports that she has never smoked. She has never used smokeless tobacco. She reports current alcohol use of about 1.0 standard drinks of alcohol per week. She reports that she does not use drugs.   Current Outpatient Medications:  .  acetaminophen (TYLENOL) 500 MG tablet, Take 500 mg by mouth every 6 (six) hours as needed., Disp: , Rfl:  .  ALPRAZolam (XANAX) 0.25 MG tablet, Take 1 tablet (0.25 mg total) by mouth daily as  needed. for anxiety (Patient taking differently: Take 0.25 mg by mouth daily as needed for anxiety. ), Disp: 30 tablet, Rfl: 0 .  busPIRone (BUSPAR) 7.5 MG tablet, Take 1 tablet (7.5 mg total) by mouth 3 (three) times daily. (Patient taking differently: Take 7.5 mg by mouth daily as needed (anxiety). ), Disp: 90 tablet, Rfl: 3 .  cholecalciferol (VITAMIN D3) 25 MCG (1000 UT) tablet, Take 1,000 Units by mouth daily., Disp: , Rfl:  .  diltiazem (CARDIZEM) 30 MG tablet, Take 1 tablet (30 mg total) by mouth every 6 (six) hours as needed. For rapid heart rate, Disp: 30 tablet, Rfl: 0 .  hydroxypropyl methylcellulose / hypromellose (ISOPTO TEARS / GONIOVISC) 2.5 %  ophthalmic solution, Place 1 drop into both eyes 3 (three) times daily as needed for dry eyes., Disp: , Rfl:  .  Multiple Vitamin (MULTI-VITAMIN) tablet, Take 1 tablet by mouth daily. , Disp: , Rfl:  .  pantoprazole (PROTONIX) 40 MG tablet, Take 1 tablet (40 mg total) by mouth daily. (Patient taking differently: Take 40 mg by mouth daily as needed (heartburn). ), Disp: 30 tablet, Rfl: 2 .  Spacer/Aero-Holding Chambers (AEROCHAMBER PLUS) inhaler, Use as instructed, Disp: 1 each, Rfl: 2  EXAM:  VITALS per patient if applicable:  GENERAL: alert, oriented, appears well and in no acute distress  HEENT: atraumatic, conjunttiva clear, no obvious abnormalities on inspection of external nose and ears  NECK: normal movements of the head and neck  LUNGS: on inspection no signs of respiratory distress, breathing rate appears normal, no obvious gross SOB, gasping or wheezing  CV: no obvious cyanosis  MS: moves all visible extremities without noticeable abnormality  PSYCH/NEURO: pleasant and cooperative, no obvious depression or anxiety, speech and thought processing grossly intact  ASSESSMENT AND PLAN:  Discussed the following assessment and plan:  Encounter for screening mammogram for breast cancer - Plan: 3d mammogram [facility]  Dysuria  Dysuria Urine culture is negative,  But  she has pyuria. Advised to follow up if symptoms persist she should accept urology referral for interstitial cystitis     I discussed the assessment and treatment plan with the patient. The patient was provided an opportunity to ask questions and all were answered. The patient agreed with the plan and demonstrated an understanding of the instructions.   The patient was advised to call back or seek an in-person evaluation if the symptoms worsen or if the condition fails to improve as anticipated.  I provided  15 minutes of non-face-to-face time during this encounter reviewing patient's current problems and post  surgeries.  Providing counseling on the above mentioned problems , and coordination  of care .   Sherlene Shams, MD

## 2019-07-25 DIAGNOSIS — R300 Dysuria: Secondary | ICD-10-CM | POA: Insufficient documentation

## 2019-07-25 NOTE — Assessment & Plan Note (Signed)
Urine culture is negative,  But  she has pyuria. Advised to follow up if symptoms persist she should accept urology referral for interstitial cystitis

## 2019-07-27 DIAGNOSIS — R300 Dysuria: Secondary | ICD-10-CM

## 2019-08-13 ENCOUNTER — Ambulatory Visit
Admission: RE | Admit: 2019-08-13 | Discharge: 2019-08-13 | Disposition: A | Discharge status: Home / Self Care | Payer: Medicare Other | Source: Ambulatory Visit | Attending: Internal Medicine | Admitting: Internal Medicine

## 2019-08-13 ENCOUNTER — Other Ambulatory Visit: Payer: Self-pay

## 2019-08-13 DIAGNOSIS — Z1231 Encounter for screening mammogram for malignant neoplasm of breast: Secondary | ICD-10-CM | POA: Insufficient documentation

## 2019-08-17 DIAGNOSIS — Z1211 Encounter for screening for malignant neoplasm of colon: Secondary | ICD-10-CM | POA: Diagnosis not present

## 2019-08-17 DIAGNOSIS — E039 Hypothyroidism, unspecified: Secondary | ICD-10-CM | POA: Diagnosis not present

## 2019-08-17 DIAGNOSIS — K219 Gastro-esophageal reflux disease without esophagitis: Secondary | ICD-10-CM | POA: Diagnosis not present

## 2019-08-17 DIAGNOSIS — M5136 Other intervertebral disc degeneration, lumbar region: Secondary | ICD-10-CM | POA: Diagnosis not present

## 2019-08-23 DIAGNOSIS — S83272D Complex tear of lateral meniscus, current injury, left knee, subsequent encounter: Secondary | ICD-10-CM | POA: Diagnosis not present

## 2019-08-23 DIAGNOSIS — M1712 Unilateral primary osteoarthritis, left knee: Secondary | ICD-10-CM | POA: Diagnosis not present

## 2019-09-15 DIAGNOSIS — M5416 Radiculopathy, lumbar region: Secondary | ICD-10-CM | POA: Diagnosis not present

## 2019-09-15 DIAGNOSIS — M48062 Spinal stenosis, lumbar region with neurogenic claudication: Secondary | ICD-10-CM | POA: Diagnosis not present

## 2019-09-15 DIAGNOSIS — M5136 Other intervertebral disc degeneration, lumbar region: Secondary | ICD-10-CM | POA: Diagnosis not present

## 2019-09-17 ENCOUNTER — Ambulatory Visit: Payer: Self-pay | Admitting: Urology

## 2019-09-22 DIAGNOSIS — K1121 Acute sialoadenitis: Secondary | ICD-10-CM | POA: Diagnosis not present

## 2019-09-22 DIAGNOSIS — M26649 Arthritis of unspecified temporomandibular joint: Secondary | ICD-10-CM | POA: Diagnosis not present

## 2019-09-23 ENCOUNTER — Ambulatory Visit: Payer: Medicare Other

## 2019-10-06 DIAGNOSIS — J301 Allergic rhinitis due to pollen: Secondary | ICD-10-CM | POA: Diagnosis not present

## 2019-10-06 DIAGNOSIS — K1123 Chronic sialoadenitis: Secondary | ICD-10-CM | POA: Diagnosis not present

## 2019-10-11 DIAGNOSIS — M1712 Unilateral primary osteoarthritis, left knee: Secondary | ICD-10-CM | POA: Diagnosis not present

## 2019-10-11 DIAGNOSIS — S83272D Complex tear of lateral meniscus, current injury, left knee, subsequent encounter: Secondary | ICD-10-CM | POA: Diagnosis not present

## 2019-10-19 ENCOUNTER — Ambulatory Visit: Payer: Medicare Other | Admitting: Urology

## 2019-10-19 ENCOUNTER — Encounter: Payer: Self-pay | Admitting: Urology

## 2019-10-19 ENCOUNTER — Other Ambulatory Visit: Payer: Self-pay

## 2019-10-19 VITALS — BP 145/84 | HR 68 | Ht 61.0 in | Wt 129.0 lb

## 2019-10-19 DIAGNOSIS — N3946 Mixed incontinence: Secondary | ICD-10-CM | POA: Diagnosis not present

## 2019-10-19 DIAGNOSIS — R300 Dysuria: Secondary | ICD-10-CM

## 2019-10-19 LAB — BLADDER SCAN AMB NON-IMAGING: Scan Result: 12

## 2019-10-19 NOTE — Progress Notes (Signed)
10/19/2019 2:14 PM   Derrill Kay February 10, 1946 [id number]  Referring provider: Crecencio Mc, MD [address] [place],  [place] [address]  Chief Complaint  Patient presents with  . Dysuria    HPI: 74 year old female who presents today for further evaluation of dysuria.  She was seen and evaluated by her primary care physician, Dr. Kalman Jewels in December 2020 for burning with urination and foul-smelling urine without frequency urgency.  On this occasion, her urine was mildly suspicious for infection including the presence of WBCs and RBCs with few bacteria, nitrate negative.  Ultimately, the urine culture was also negative.  No history of recent or recurrent urinary tract infections other than above.  She never received antibiotics.  Her symptoms cleared up fairly quickly after her visit and have not recurred.  She denies any ongoing dysuria.  Her urine is also cleared.  She does have some baseline urgency which is not severe.  She occasionally will have accidents not getting to the bathroom in time but these are very infrequent.  She also leaks a little bit when she sneezes.  She wears a thin panty liner once daily for this but otherwise has no bother from this.  This is a chronic issue and not severe.  She does mention today that her father had bladder cancer.  She denies any gross hematuria.  PVR today 12 cc.   PMH: Past Medical History:  Diagnosis Date  . Anxiety   . Arthritis   . Chicken pox   . Dyspnea    denies  . Dysrhythmia    tachy at times  . Fatigue   . GERD (gastroesophageal reflux disease)   . Hair loss   . Heart murmur    hx of   . Heartburn   . Hyperlipidemia   . Migraines    had many years ago, not currently  . Peptic ulcer   . Thyroid nodule     Surgical History: Past Surgical History:  Procedure Laterality Date  . BREAST EXCISIONAL BIOPSY Left 2005-2010   benign  . BREAST SURGERY     left breast biopsy   . CESAREAN SECTION     . CHOLECYSTECTOMY    . DILATION AND CURETTAGE OF UTERUS    . KNEE ARTHROSCOPY WITH LATERAL MENISECTOMY Left 05/18/2019   Procedure: KNEE ARTHROSCOPY WITH DEBRIDEMENT AND PARTIAL  LATERAL MENISECTOMY;  Surgeon: Corky Mull, MD;  Location: [facility] ORS;  Service: Orthopedics;  Laterality: Left;  . SHOULDER OPEN ROTATOR CUFF REPAIR Right 03/31/2013   Procedure: RIGHT ROTATOR CUFF REPAIR SHOULDER OPEN;  Surgeon: Tobi Bastos, MD;  Location: [facility];  Service: Orthopedics;  Laterality: Right;  With Patch and Anchor  . TONSILLECTOMY AND ADENOIDECTOMY      Home Medications:  Allergies as of 10/19/2019      Reactions   Tramadol Shortness Of Breath   Ibuprofen Other (See Comments)   Gas pains      Medication List       Accurate as of October 19, 2019  2:14 PM. If you have any questions, ask your nurse or doctor.        STOP taking these medications   ALPRAZolam 0.25 MG tablet Commonly known as: Duanne Moron Stopped by: Hollice Espy, MD   diltiazem 30 MG tablet Commonly known as: Cardizem Stopped by: Hollice Espy, MD   pantoprazole 40 MG tablet Commonly known as: Protonix Stopped by: Hollice Espy, MD     TAKE these medications  acetaminophen 500 MG tablet Commonly known as: TYLENOL Take 500 mg by mouth every 6 (six) hours as needed.   AeroChamber Plus inhaler Use as instructed   busPIRone 7.5 MG tablet Commonly known as: BUSPAR Take 1 tablet (7.5 mg total) by mouth 3 (three) times daily. What changed:   when to take this  reasons to take this   cholecalciferol 25 MCG (1000 UNIT) tablet Commonly known as: VITAMIN D3 Take 1,000 Units by mouth daily.   hydroxypropyl methylcellulose / hypromellose 2.5 % ophthalmic solution Commonly known as: ISOPTO TEARS / GONIOVISC Place 1 drop into both eyes 3 (three) times daily as needed for dry eyes.   Multi-Vitamin tablet Take 1 tablet by mouth daily.       Allergies:  Allergies  Allergen Reactions  . Tramadol  Shortness Of Breath  . Ibuprofen Other (See Comments)    Gas pains    Family History: Family History  Problem Relation Age of Onset  . Breast cancer Mother 15  . Arthritis Mother   . Chronic fatigue Mother   . Diabetes Father   . Prostate cancer Father   . Diabetes Paternal Grandfather     Social History:  reports that she has never smoked. She has never used smokeless tobacco. She reports current alcohol use of about 1.0 standard drinks of alcohol per week. She reports that she does not use drugs.   Physical Exam: BP (!) 145/84   Pulse 68   Ht 5\' 1"  (1.549 m)   Wt 129 lb (58.5 kg)   BMI 24.37 kg/m   Constitutional:  Alert and oriented, No acute distress. HEENT: [place] AT, moist mucus membranes.  Trachea midline, no masses. Cardiovascular: No clubbing, cyanosis, or edema. Respiratory: Normal respiratory effort, no increased work of breathing. Skin: No rashes, bruises or suspicious lesions. Neurologic: Grossly intact, no focal deficits, moving all 4 extremities. Psychiatric: Normal mood and affect.  Laboratory Data: Lab Results  Component Value Date   WBC 7.1 05/03/2019   HGB 12.6 05/03/2019   HCT 38.3 05/03/2019   MCV 89.3 05/03/2019   PLT 231 05/03/2019    Lab Results  Component Value Date   CREATININE 0.81 01/21/2019     Lab Results  Component Value Date   HGBA1C 5.7 10/24/2015    Urinalysis Microscopic urinalysis today is completely negative, no WBCs or RBCs   Assessment & Plan:    1. Dysuria Isolated episode of dysuria in December 2020 with a suspicious appearing urine at the time  We discussed in the clinical scenario of having acute urinary symptoms with a suspicious urinalysis, she most likely did in fact have a urinary tract infection.  We discussed that urine culture can sometimes be falsely negative when contaminated, recent antibiotics were given, or occasionally the bacteria just does not grow for what ever reason.  We also discussed the  possibility of atypical bacteriuria such as Ureaplasma or mycoplasma.  Given that she is completely asymptomatic today and her urine is negative, no role for further evaluation or treatment at this time  She was urged to return if her symptoms recur for same her next evaluation with urinalysis.  If she continues to have suspicious appearing urines in the absence of growth, will consider further evaluation in the form of cystoscopy and/or further imaging.  She is agreeable this plan and will return as needed. 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

## 2019-10-20 LAB — URINALYSIS, COMPLETE
Bilirubin, UA: NEGATIVE
Glucose, UA: NEGATIVE
Ketones, UA: NEGATIVE
Leukocytes,UA: NEGATIVE
Nitrite, UA: NEGATIVE
Protein,UA: NEGATIVE
Specific Gravity, UA: <1.005 — ABNORMAL LOW (ref 1.005–1.030)
Urobilinogen, Ur: 0.2 mg/dL (ref 0.2–1.0)
pH, UA: 5 (ref 5.0–7.5)

## 2019-10-20 LAB — MICROSCOPIC EXAMINATION

## 2019-11-12 DIAGNOSIS — M1712 Unilateral primary osteoarthritis, left knee: Secondary | ICD-10-CM | POA: Diagnosis not present

## 2019-11-19 DIAGNOSIS — M1712 Unilateral primary osteoarthritis, left knee: Secondary | ICD-10-CM | POA: Diagnosis not present

## 2019-11-26 DIAGNOSIS — M1712 Unilateral primary osteoarthritis, left knee: Secondary | ICD-10-CM | POA: Diagnosis not present

## 2019-12-07 IMAGING — CT CT CHEST W/O CM
2 of 4 series · 15 of 36 positions shown, 18 images · non-contrast
Comparison: Chest CTA dated 01/21/2019.

CLINICAL DATA: Follow-up right middle lobe nodules.

EXAM:
CT CHEST WITHOUT CONTRAST
TECHNIQUE: Multidetector CT imaging of the chest was performed following the
standard protocol without IV contrast.

[Series 2: chest 2.00 · axial · 0.53mm/px · z∈[-1187,-935]mm · 12 of 150 slices shown, 15 images]
[im 12/150  mediastinal]
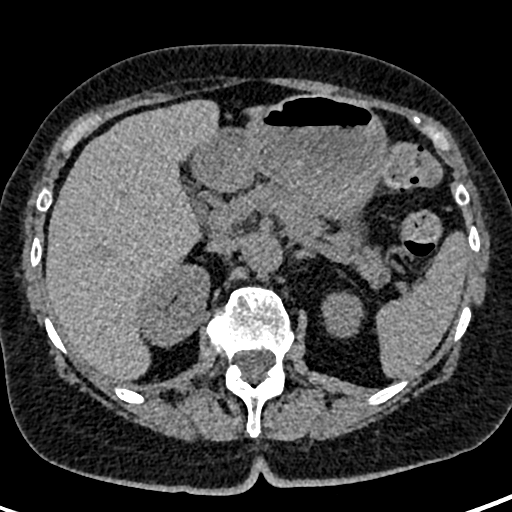
[im 12/150  lung]
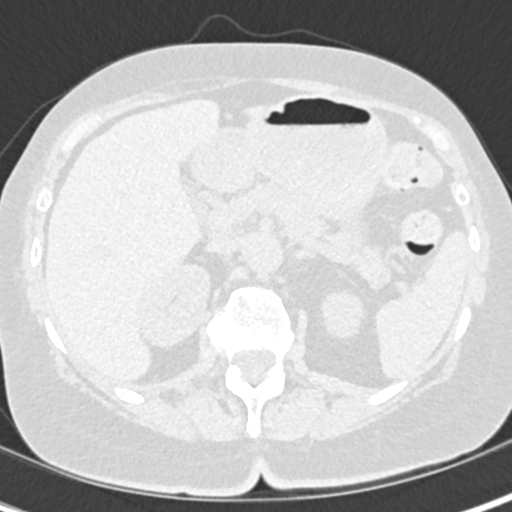
[im 23/150  lung]
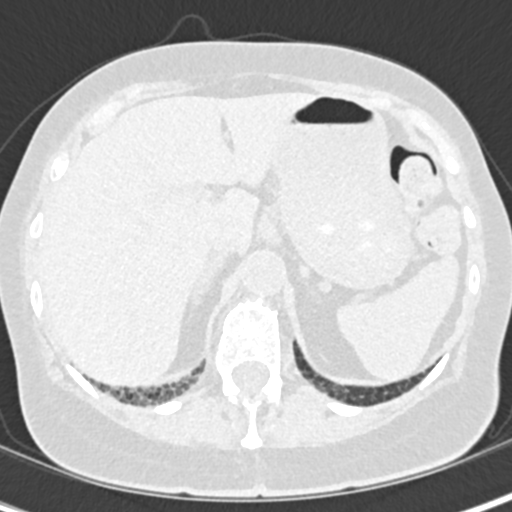
[im 35/150  lung]
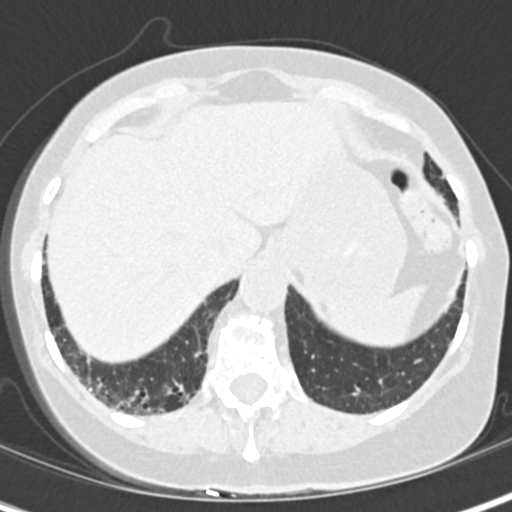
[im 46/150  lung]
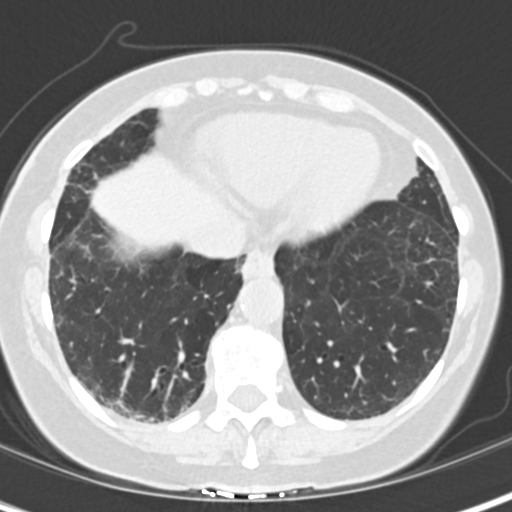
[im 58/150  mediastinal]
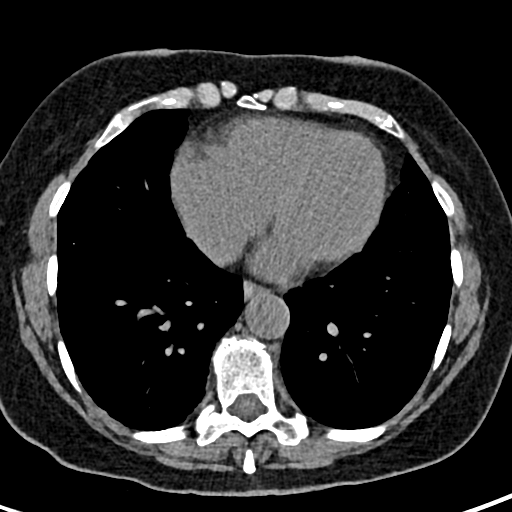
[im 58/150  lung]
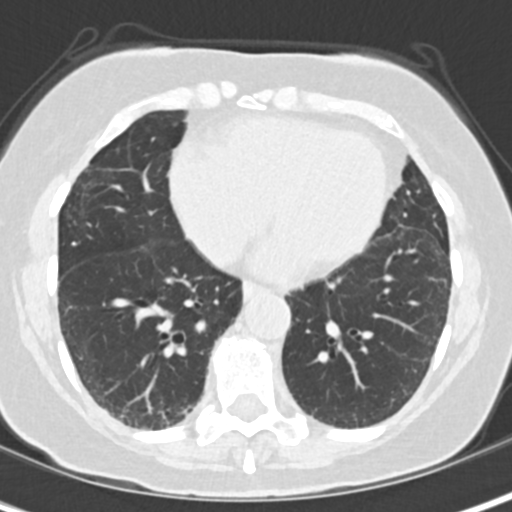
[im 69/150  lung]
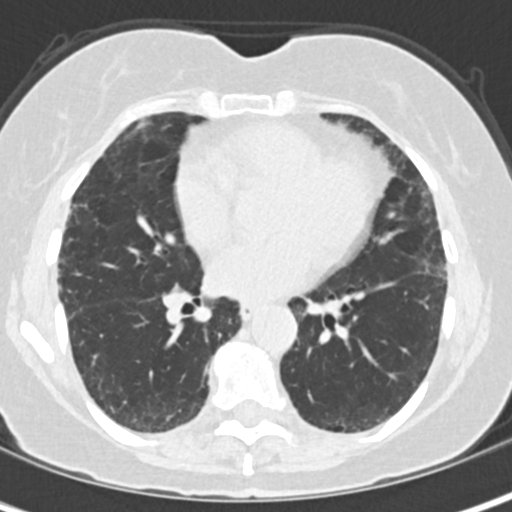
[im 81/150  lung]
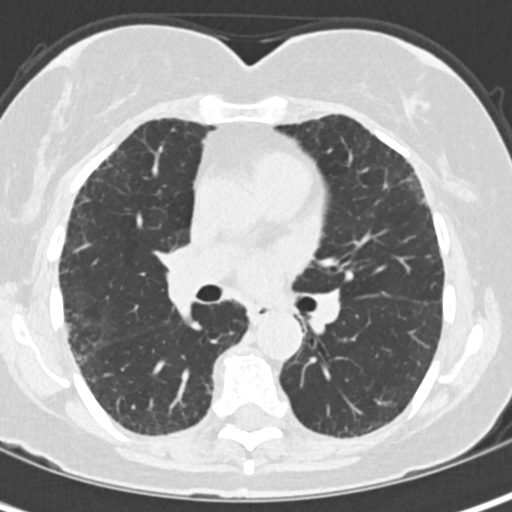
[im 92/150  lung]
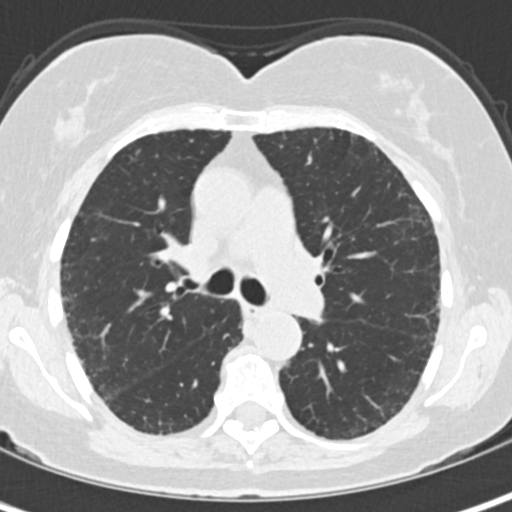
[im 104/150  mediastinal]
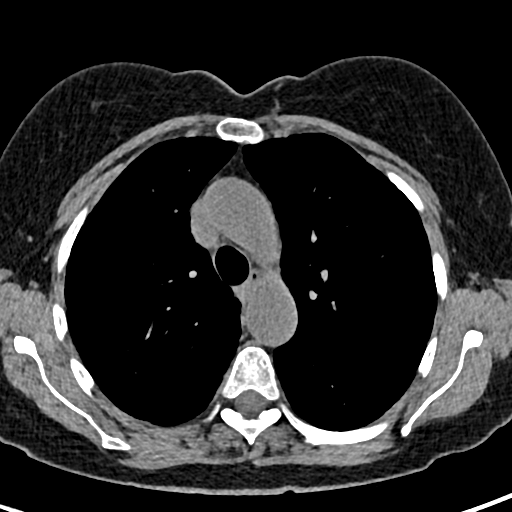
[im 104/150  lung]
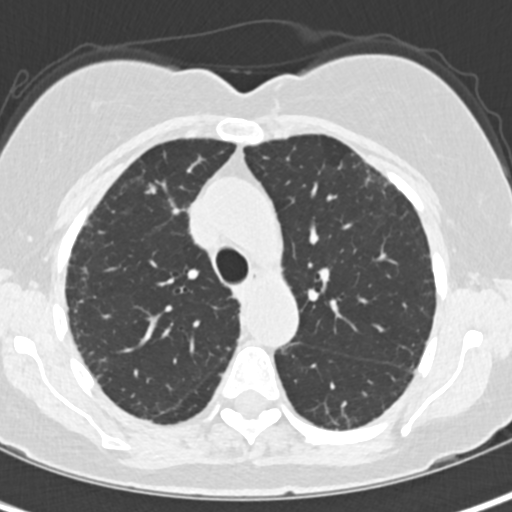
[im 115/150  lung]
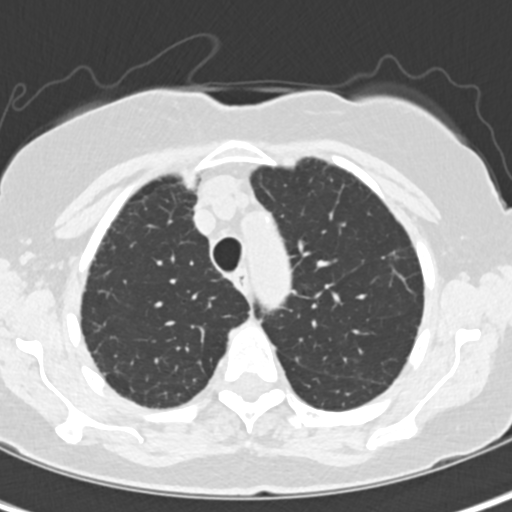
[im 127/150  lung]
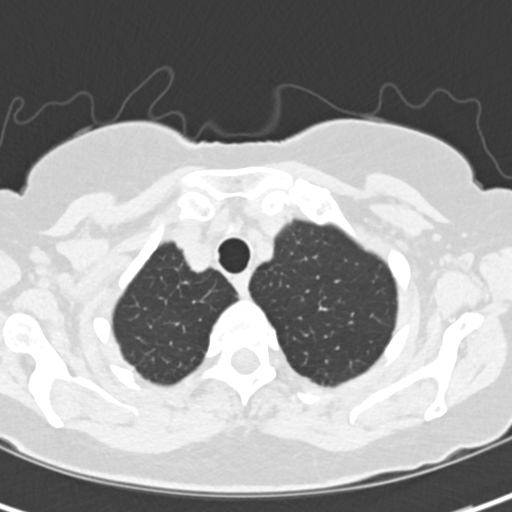
[im 138/150  lung]
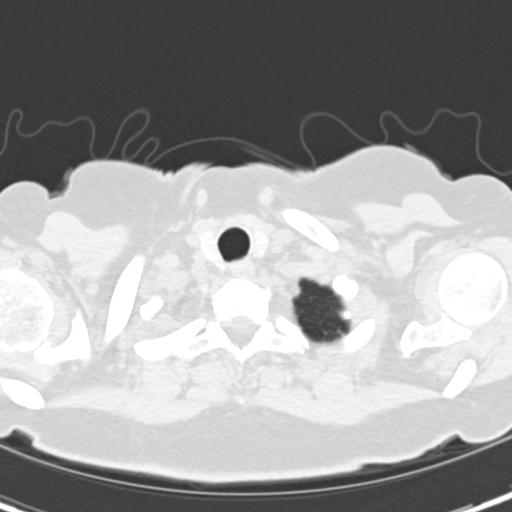

[Series 5: coronals chest 2.00 cor · coronal · 0.53mm/px · 3 of 127 slices shown]
[im 26/127  lung]
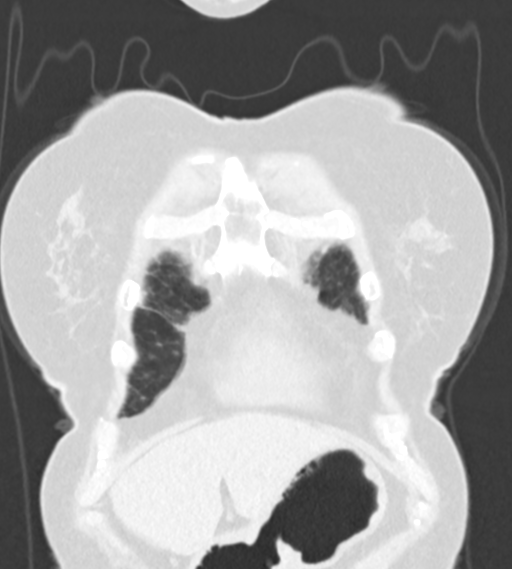
[im 51/127  lung]
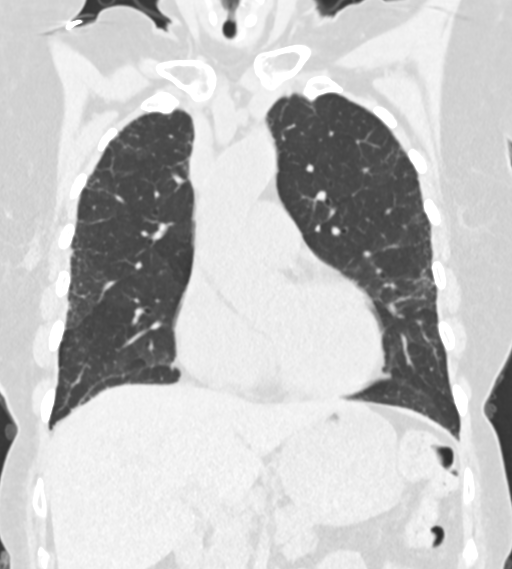
[im 76/127  lung]
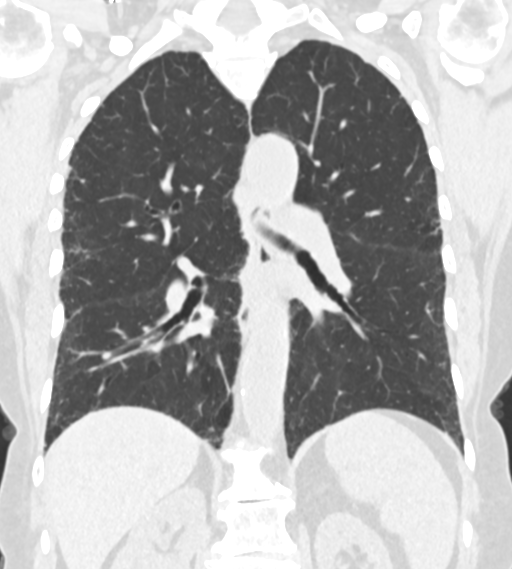

[15 of 36 positions shown; findings below may reference images not displayed]

FINDINGS: Cardiovascular: Minimal atheromatous coronary artery and aortic
calcification. Normal sized heart.

Mediastinum/Nodes: No enlarged mediastinal or axillary lymph nodes.
Thyroid gland, trachea, and esophagus demonstrate no significant
findings.

Lungs/Pleura: No significant change in mild bilateral peripheral
interstitial prominence. There is also interval cylindrical
bronchiectasis in the inferior aspect of the right lower lobe. The
lungs remain mildly hyperexpanded with mild peribronchial
thickening.

The previously demonstrated larger right middle lobe nodule is
slightly smaller. This previously measured 6 mm in maximum diameter
and currently measures 5 mm in maximum diameter on image number 87
series 3. An adjacent nodule in the right middle lobe on the same
image today currently measures 5 mm, previously 4 mm. Some of the
differences in size may be due to differences in slice thickness. No
new nodules. A small calcified granuloma is noted in the right
middle lobe.

Upper Abdomen: Unremarkable.

Musculoskeletal: Thoracic and lower cervical spine degenerative
changes.
IMPRESSION: 1. No significant change in 2 small right middle lobe nodules.
Non-contrast chest CT at 3-6 months is recommended. A follow-up CT
at 18-24 months (from today's scan) is considered optional for
low-risk patients, but is recommended for high-risk patients. This
recommendation follows the consensus statement: Guidelines for
Management of Incidental Pulmonary Nodules Detected on CT Images:
2. No significant change in mild changes of COPD and chronic
bronchitis.
3. Interval cylindrical bronchiectasis in the inferior aspect of the
right lower lobe.
4. Stable mild bilateral peripheral interstitial lung disease.
5. Minimal calcified coronary artery and aortic atherosclerosis.

Aortic Atherosclerosis (AVH12-VRU.U) and Emphysema (AVH12-BCG.X).

## 2019-12-17 ENCOUNTER — Ambulatory Visit
Admission: RE | Admit: 2019-12-17 | Discharge: 2019-12-17 | Disposition: A | Discharge status: Home / Self Care | Payer: Medicare Other | Source: Ambulatory Visit | Attending: Pulmonary Disease | Admitting: Pulmonary Disease

## 2019-12-17 ENCOUNTER — Other Ambulatory Visit: Payer: Self-pay

## 2019-12-17 DIAGNOSIS — R911 Solitary pulmonary nodule: Secondary | ICD-10-CM | POA: Diagnosis not present

## 2019-12-17 DIAGNOSIS — R918 Other nonspecific abnormal finding of lung field: Secondary | ICD-10-CM

## 2019-12-20 ENCOUNTER — Other Ambulatory Visit: Payer: Self-pay | Admitting: Pulmonary Disease

## 2019-12-20 ENCOUNTER — Telehealth: Payer: Self-pay | Admitting: Pulmonary Disease

## 2019-12-20 DIAGNOSIS — R911 Solitary pulmonary nodule: Secondary | ICD-10-CM

## 2019-12-20 NOTE — Telephone Encounter (Signed)
Advised pt of results. Pt understood and nothing further is needed.   

## 2019-12-20 NOTE — Progress Notes (Signed)
Patient identification verified. Results of recent chest CT reviewed. Per Dr. Jayme Cloud, CT chest STABLE no new findings. Nodules stable. Will need repeat in 12 months. Patient verbalized understanding of results. Order for chest CT without contrast ordered for pulmonary nodule in April 2022.

## 2019-12-24 DIAGNOSIS — M25462 Effusion, left knee: Secondary | ICD-10-CM | POA: Diagnosis not present

## 2019-12-24 DIAGNOSIS — M1712 Unilateral primary osteoarthritis, left knee: Secondary | ICD-10-CM | POA: Diagnosis not present

## 2020-01-04 ENCOUNTER — Ambulatory Visit: Payer: Medicare Other | Admitting: Pulmonary Disease

## 2020-01-04 ENCOUNTER — Other Ambulatory Visit: Payer: Self-pay

## 2020-01-04 ENCOUNTER — Encounter: Payer: Self-pay | Admitting: Pulmonary Disease

## 2020-01-04 VITALS — BP 140/70 | HR 72 | Temp 97.7°F | Ht 61.0 in | Wt 131.0 lb

## 2020-01-04 DIAGNOSIS — R918 Other nonspecific abnormal finding of lung field: Secondary | ICD-10-CM

## 2020-01-04 DIAGNOSIS — R059 Cough, unspecified: Secondary | ICD-10-CM

## 2020-01-04 DIAGNOSIS — R05 Cough: Secondary | ICD-10-CM | POA: Diagnosis not present

## 2020-01-04 DIAGNOSIS — J849 Interstitial pulmonary disease, unspecified: Secondary | ICD-10-CM

## 2020-01-04 MED ORDER — ARNUITY ELLIPTA 100 MCG/ACT IN AEPB: 1.0000 | INHALATION_SPRAY | Freq: Every day | RESPIRATORY_TRACT | 3 refills | Status: DC

## 2020-01-04 NOTE — Patient Instructions (Signed)
Your CT scan was stable.  There is some minimal scarring that the breathing test will help clarify.  We will need to repeat the CT in a year you have 2 small little spots that will need to be followed up.  We are going to schedule breathing tests.   I am going to give you a trial of Arnuity Ellipta 100 mcg, 1 puff daily.  To see if this helps with your cough.   We will follow-up in 3 months time.  Call sooner should any new difficulties arise.

## 2020-01-04 NOTE — Progress Notes (Signed)
Subjective:    Patient ID: Kimberly Rojas, female    DOB: 13-Nov-1945, 74 y.o.   MRN: [id number]  HPI Patient is a 74 year old lifelong never smoker who presents for follow-up on cough.  She was initially seen here on April 2020.  At that time she noted the cough was present for 2 months.  A CT scan of the chest at that time that showed some very minimal subpleural fibrosis which was nonspecific.  She was treated for gastroesophageal reflux with improvement in her cough.  On 21 Jan 2019 she presented to the emergency room with tachycardia shortness of breath and was noted to have a D-dimer in the 600 range.  CT angiography showed mild volume overload, no pulmonary embolism but findings consistent with perhaps an aspiration pneumonitis type issue.  A repeat CT scan in 6 months was recommended.  Having some issues with dyspnea that have resolved with anxiolytics.  In August 2020.  She had CT scan of the chest repeated on 01 July 2019.  This shows some mild cylindrical bronchiectasis very mild peripheral interstitial lung disease systemic chronic bronchitis and 2 pulmonary nodules that needed follow-up in 6 months.  The follow-up CT was performed 17 December 2019 shows persistent 6 mm and 4 mm pulmonary nodules on RIGHT middle lobe.  No change from prior.  There is mild persistent fibrotic changes as noted previously.  Repeat CT in 12 months.  All these findings were relayed to the patient.  Patient notes that she has still persistent cough however it has been better with antireflux measures.  She discontinued use of PPI.  She has been noted to have significant issues with GERD.  She has not had any fevers, chills or sweats.  No sputum production and no hemoptysis.  Cough has been dry.  Review of Systems A 10 point review of systems was performed and it is as noted above otherwise negative.  Immunization History  Administered Date(s) Administered  . Influenza,inj,Quad PF,6+ Mos 06/11/2019  .  Influenza-Unspecified 07/18/2015, 06/11/2019  . PFIZER SARS-COV-2 Vaccination 11/12/2019, 11/30/2019  . Pneumococcal Conjugate-13 11/26/2017  . Tdap 10/31/2013   Current Meds  Medication Sig  . acetaminophen (TYLENOL) 500 MG tablet Take 500 mg by mouth every 6 (six) hours as needed.  . busPIRone (BUSPAR) 7.5 MG tablet Take 1 tablet (7.5 mg total) by mouth 3 (three) times daily. (Patient taking differently: Take 7.5 mg by mouth daily as needed (anxiety). )  . cholecalciferol (VITAMIN D3) 25 MCG (1000 UT) tablet Take 1,000 Units by mouth daily.  . Fluticasone Furoate (ARNUITY ELLIPTA) 100 MCG/ACT AEPB Inhale 1 puff into the lungs daily.  . hydroxypropyl methylcellulose / hypromellose (ISOPTO TEARS / GONIOVISC) 2.5 % ophthalmic solution Place 1 drop into both eyes 3 (three) times daily as needed for dry eyes.  . Multiple Vitamin (MULTI-VITAMIN) tablet Take 1 tablet by mouth daily.   Marland Kitchen Spacer/Aero-Holding Chambers (AEROCHAMBER PLUS) inhaler Use as instructed        Objective:   Physical Exam BP 140/70 (BP Location: Left Arm, Cuff Size: Normal)   Pulse 72   Temp 97.7 F (36.5 C) (Temporal)   Ht 5\' 1"  (1.549 m)   Wt 131 lb (59.4 kg)   SpO2 99%   BMI 24.75 kg/m  GENERAL: Thin well-developed woman in no acute distress.  Fully ambulatory  HEAD: Normocephalic, atraumatic.  EYES: Pupils equal, round, reactive to light.  No scleral icterus.  MOUTH: Nose/mouth/throat not examined due to masking requirements for  COVID 19. NECK: Supple. No thyromegaly. No nodules. No JVD.  Trachea midline.  No crepitus. PULMONARY: No Velcro crackles. Good air entry bilaterally. Somewhat coarse breath sounds.  No other adventitious sounds. CARDIOVASCULAR: S1 and S2. Regular rate and rhythm.  No rubs murmurs gallops heard. GASTROINTESTINAL: Benign. MUSCULOSKELETAL: She has OA changes bilateral hands, no clubbing, no edema.  NEUROLOGIC: No focal deficits, awake, alert, speech is fluent.  Oriented. SKIN:  Intact,warm,dry.  Limited exam no rashes. PSYCH: Mood and behavior are appropriate.  Representative CT slice from CT 17 December 2019, independently reviewed, reviewed with patient:      Assessment & Plan:   Interstitial lung disease Lung nodules x2 (6 mm, 4 mm RIGHT middle lobe) Nonspecific, may be postinflammatory or post aspiration pneumonitis Significant issues with GERD. 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 Fluticasone Furoate (ARNUITY ELLIPTA) 100 MCG/ACT AEPB    Sig: Inhale 1 puff into the lungs daily.    Dispense:  30 each    Refill:  3    Orders Placed This Encounter  Procedures  . Pulmonary Function Test [facility] Only    Standing Status:   Future    Standing Expiration Date:   01/03/2021    Order Specific Question:   Full PFT: includes the following: basic spirometry, spirometry pre & post bronchodilator, diffusion capacity (DLCO), lung volumes    Answer:   Full PFT    Order Specific Question:   This test can only be performed at    Answer:   [facility]   *This note was dictated using voice recognition software/Dragon.  Despite best efforts to proofread, errors can occur which can change the meaning.  Any change was purely unintentional.

## 2020-01-06 ENCOUNTER — Other Ambulatory Visit
Admission: RE | Admit: 2020-01-06 | Discharge: 2020-01-06 | Disposition: A | Discharge status: Home / Self Care | Payer: Medicare Other | Source: Ambulatory Visit | Attending: Pulmonary Disease | Admitting: Pulmonary Disease

## 2020-01-06 ENCOUNTER — Encounter: Payer: Self-pay | Admitting: Pulmonary Disease

## 2020-01-06 ENCOUNTER — Telehealth: Payer: Self-pay | Admitting: Pulmonary Disease

## 2020-01-06 ENCOUNTER — Other Ambulatory Visit: Payer: Self-pay

## 2020-01-06 DIAGNOSIS — Z20822 Contact with and (suspected) exposure to covid-19: Secondary | ICD-10-CM | POA: Diagnosis not present

## 2020-01-06 DIAGNOSIS — Z01812 Encounter for preprocedural laboratory examination: Secondary | ICD-10-CM | POA: Insufficient documentation

## 2020-01-06 LAB — SARS CORONAVIRUS 2 (TAT 6-24 HRS): SARS Coronavirus 2: NEGATIVE

## 2020-01-06 NOTE — Telephone Encounter (Signed)
I called and spoke with the patient and she states that she hasnt started the inhaler yet, but she was reading the pamphlet inside the package and the side effects concerned her with her already having cataracts she does not want them to get worse. She also doesn't want it to effect her bones. She states that she would rather not take the Arnuity inhaler.   Dr. Jayme Cloud please advise.

## 2020-01-07 ENCOUNTER — Ambulatory Visit: Payer: Medicare Other | Attending: Pulmonary Disease

## 2020-01-07 DIAGNOSIS — J849 Interstitial pulmonary disease, unspecified: Secondary | ICD-10-CM | POA: Insufficient documentation

## 2020-01-07 MED ORDER — ALBUTEROL SULFATE (2.5 MG/3ML) 0.083% IN NEBU
2.5000 mg | INHALATION_SOLUTION | Freq: Once | RESPIRATORY_TRACT | Status: AC
  Administered 2020-01-07: 2.5 mg via RESPIRATORY_TRACT
  Filled 2020-01-07: qty 3

## 2020-01-11 NOTE — Telephone Encounter (Signed)
Dr. Gonzalez please advise. Thanks 

## 2020-01-12 NOTE — Telephone Encounter (Signed)
I tried calling the pt and she did not answer and no option to leave msg

## 2020-01-12 NOTE — Telephone Encounter (Signed)
This will not make her cataracts worse.  Her cataracts have already developed.  She needs to start the inhaler.

## 2020-01-13 NOTE — Telephone Encounter (Signed)
Spoke with the pt and notified of response per Dr Jayme Cloud. She verbalized understanding. Nothing further needed.

## 2020-01-28 DIAGNOSIS — M1712 Unilateral primary osteoarthritis, left knee: Secondary | ICD-10-CM | POA: Diagnosis not present

## 2020-01-28 DIAGNOSIS — S83272D Complex tear of lateral meniscus, current injury, left knee, subsequent encounter: Secondary | ICD-10-CM | POA: Diagnosis not present

## 2020-02-02 ENCOUNTER — Other Ambulatory Visit: Payer: Self-pay

## 2020-02-04 ENCOUNTER — Other Ambulatory Visit: Payer: Self-pay

## 2020-02-04 ENCOUNTER — Encounter: Payer: Self-pay | Admitting: Internal Medicine

## 2020-02-04 ENCOUNTER — Ambulatory Visit (INDEPENDENT_AMBULATORY_CARE_PROVIDER_SITE_OTHER): Payer: Medicare Other | Admitting: Internal Medicine

## 2020-02-04 VITALS — BP 142/78 | HR 64 | Temp 97.0°F | Ht 60.98 in | Wt 131.2 lb

## 2020-02-04 DIAGNOSIS — I8393 Asymptomatic varicose veins of bilateral lower extremities: Secondary | ICD-10-CM | POA: Diagnosis not present

## 2020-02-04 DIAGNOSIS — F5105 Insomnia due to other mental disorder: Secondary | ICD-10-CM

## 2020-02-04 DIAGNOSIS — E559 Vitamin D deficiency, unspecified: Secondary | ICD-10-CM

## 2020-02-04 DIAGNOSIS — R030 Elevated blood-pressure reading, without diagnosis of hypertension: Secondary | ICD-10-CM

## 2020-02-04 DIAGNOSIS — F409 Phobic anxiety disorder, unspecified: Secondary | ICD-10-CM

## 2020-02-04 DIAGNOSIS — Z1211 Encounter for screening for malignant neoplasm of colon: Secondary | ICD-10-CM | POA: Diagnosis not present

## 2020-02-04 DIAGNOSIS — F411 Generalized anxiety disorder: Secondary | ICD-10-CM

## 2020-02-04 DIAGNOSIS — R300 Dysuria: Secondary | ICD-10-CM

## 2020-02-04 DIAGNOSIS — R5383 Other fatigue: Secondary | ICD-10-CM

## 2020-02-04 DIAGNOSIS — Z0000 Encounter for general adult medical examination without abnormal findings: Secondary | ICD-10-CM

## 2020-02-04 DIAGNOSIS — E039 Hypothyroidism, unspecified: Secondary | ICD-10-CM

## 2020-02-04 MED ORDER — BUSPIRONE HCL 7.5 MG PO TABS: 7.5000 mg | ORAL_TABLET | Freq: Three times a day (TID) | ORAL | 11 refills | Status: DC

## 2020-02-04 NOTE — Patient Instructions (Addendum)
You can take 2000 mg of acetominophen (tylenol) every day safely  In divided doses (500 mg every 6 hours  Or 1000 mg every 12 hours.)   Take your second dose of buspirone at 4 am if you can't relax.  This medication is safe enough to take  3 times daily   You can also add turmeric as your anti inflammatory  Return for fasting labs   Health Maintenance After Age 76 After age 91, you are at a higher risk for certain long-term diseases and infections as well as injuries from falls. Falls are a major cause of broken bones and head injuries in people who are older than age 15. Getting regular preventive care can help to keep you healthy and well. Preventive care includes getting regular testing and making lifestyle changes as recommended by your health care provider. Talk with your health care provider about:  Which screenings and tests you should have. A screening is a test that checks for a disease when you have no symptoms.  A diet and exercise plan that is right for you. What should I know about screenings and tests to prevent falls? Screening and testing are the best ways to find a health problem early. Early diagnosis and treatment give you the best chance of managing medical conditions that are common after age 52. Certain conditions and lifestyle choices may make you more likely to have a fall. Your health care provider may recommend:  Regular vision checks. Poor vision and conditions such as cataracts can make you more likely to have a fall. If you wear glasses, make sure to get your prescription updated if your vision changes.  Medicine review. Work with your health care provider to regularly review all of the medicines you are taking, including over-the-counter medicines. Ask your health care provider about any side effects that may make you more likely to have a fall. Tell your health care provider if any medicines that you take make you feel dizzy or sleepy.  Osteoporosis screening.  Osteoporosis is a condition that causes the bones to get weaker. This can make the bones weak and cause them to break more easily.  Blood pressure screening. Blood pressure changes and medicines to control blood pressure can make you feel dizzy.  Strength and balance checks. Your health care provider may recommend certain tests to check your strength and balance while standing, walking, or changing positions.  Foot health exam. Foot pain and numbness, as well as not wearing proper footwear, can make you more likely to have a fall.  Depression screening. You may be more likely to have a fall if you have a fear of falling, feel emotionally low, or feel unable to do activities that you used to do.  Alcohol use screening. Using too much alcohol can affect your balance and may make you more likely to have a fall. What actions can I take to lower my risk of falls? General instructions  Talk with your health care provider about your risks for falling. Tell your health care provider if: ? You fall. Be sure to tell your health care provider about all falls, even ones that seem minor. ? You feel dizzy, sleepy, or off-balance.  Take over-the-counter and prescription medicines only as told by your health care provider. These include any supplements.  Eat a healthy diet and maintain a healthy weight. A healthy diet includes low-fat dairy products, low-fat (lean) meats, and fiber from whole grains, beans, and lots of fruits and vegetables.  Home safety  Remove any tripping hazards, such as rugs, cords, and clutter.  Install safety equipment such as grab bars in bathrooms and safety rails on stairs.  Keep rooms and walkways well-lit. Activity   Follow a regular exercise program to stay fit. This will help you maintain your balance. Ask your health care provider what types of exercise are appropriate for you.  If you need a cane or walker, use it as recommended by your health care provider.  Wear  supportive shoes that have nonskid soles. Lifestyle  Do not drink alcohol if your health care provider tells you not to drink.  If you drink alcohol, limit how much you have: ? 0-1 drink a day for women. ? 0-2 drinks a day for men.  Be aware of how much alcohol is in your drink. In the [place] one drink equals one typical bottle of beer (12 oz), one-half glass of wine (5 oz), or one shot of hard liquor (1 oz).  Do not use any products that contain nicotine or tobacco, such as cigarettes and e-cigarettes. If you need help quitting, ask your health care provider. Summary  Having a healthy lifestyle and getting preventive care can help to protect your health and wellness after age 50.  Screening and testing are the best way to find a health problem early and help you avoid having a fall. Early diagnosis and treatment give you the best chance for managing medical conditions that are more common for people who are older than age 5.  Falls are a major cause of broken bones and head injuries in people who are older than age 10. Take precautions to prevent a fall at home.  Work with your health care provider to learn what changes you can make to improve your health and wellness and to prevent falls. This information is not intended to replace advice given to you by your health care provider. Make sure you discuss any questions you have with your health care provider. Document Revised: 12/10/2018 Document Reviewed: 07/02/2017 Elsevier Patient Education  2020 [employer].

## 2020-02-04 NOTE — Assessment & Plan Note (Signed)
Home readings are 130/80 or less consistently

## 2020-02-04 NOTE — Progress Notes (Signed)
Patient ID: Kimberly Rojas, female    DOB: 05-20-1946  Age: 74 y.o. MRN: [id number]  The patient is here for annual PREVENTIVE examination and management of other chronic and acute problems.   The risk factors are reflected in the social history.  The roster of all physicians providing medical care to patient - is listed in the Snapshot section of the chart.  Activities of daily living:  The patient is 100% independent in all ADLs: dressing, toileting, feeding as well as independent mobility  Home safety : The patient has smoke detectors in the home. They wear seatbelts.  There are no firearms at home. There is no violence in the home.   There is no risks for hepatitis, STDs or HIV. There is no   history of blood transfusion. They have no travel history to infectious disease endemic areas of the world.  The patient has seen their dentist in the last six month. They have seen their eye doctor in the last year. Has stable cataracts per yearly visit.   They had a hearing test in the last year by Dr Vaught's office and admits to slight hearing difficulty with regard to whispered voices and some television programs. She owns a hearing aide.    They do not  have excessive sun exposure. Discussed the need for sun protection: hats, long sleeves and use of sunscreen if there is significant sun exposure.   Diet: the importance of a healthy diet is discussed. They do have a healthy diet.  The benefits of regular aerobic exercise were discussed. She walks 4 times per week ,  20 minutes.   Depression screen: there are no signs or vegative symptoms of depression- irritability, change in appetite, anhedonia, sadness/tearfullness.  Cognitive assessment: the patient manages all their financial and personal affairs and is actively engaged. They could relate day,date,year and events; recalled 2/3 objects at 3 minutes; performed clock-face test normally.  The following portions of the patient's history were  reviewed and updated as appropriate: allergies, current medications, past family history, past medical history,  past surgical history, past social history  and problem list.  Visual acuity was not assessed per patient preference since she has regular follow up with her ophthalmologist. Hearing and body mass index were assessed and reviewed.   During the course of the visit the patient was educated and counseled about appropriate screening and preventive services including : fall prevention , diabetes screening, nutrition counseling, colorectal cancer screening, and recommended immunizations.    CC: The primary encounter diagnosis was Dysuria. Diagnoses of Colon cancer screening, White coat syndrome without diagnosis of hypertension, Varicose veins of both lower extremities without ulcer or inflammation, Vitamin D deficiency, Fatigue, unspecified type, Encounter for preventive health examination, Generalized anxiety disorder, Insomnia due to anxiety and fear, and Acquired hypothyroidism were also pertinent to this visit.   1) anxiety about health.  She has been managing her anxiety with less buspirone which she is tolerating and taking once dailys.  2) Urinary issues : Saw urology,  Normal evaluation. Advised to return for cystoscopy if symptoms return  Still has frothy urine at times but no proteinuria by repeat urinalysis.  Post void residual done and low at 12 ml  3) Stressors  Anxious about  managing house by herself since husband died. 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 . Lives alone with a dog . Has one son who works from home nearby in [place]. Marland Kitchen Has given up longer walks due to pain.  not using tylenol on a regular basis. Still walks    History Kimberly Rojas has a past medical history of Anxiety, Arthritis, Chicken pox,  Dyspnea, Dysrhythmia, Fatigue, GERD (gastroesophageal reflux disease), Hair loss, Heart murmur, Heartburn, Hyperlipidemia, Migraines, Peptic ulcer, and Thyroid nodule.   She has a past surgical history that includes Cholecystectomy; Breast surgery; Shoulder open rotator cuff repair (Right, 03/31/2013); Tonsillectomy and adenoidectomy; Dilation and curettage of uterus; Breast excisional biopsy (Left, 2005-2010); Cesarean section; and Knee arthroscopy with lateral menisectomy (Left, 05/18/2019).   Her family history includes Arthritis in her mother; Breast cancer (age of onset: 26) in her mother; Chronic fatigue in her mother; Diabetes in her father and paternal grandfather; Prostate cancer in her father.She reports that she has never smoked. She has never used smokeless tobacco. She reports current alcohol use of about 1.0 standard drinks of alcohol per week. She reports that she does not use drugs.  Outpatient Medications Prior to Visit  Medication Sig Dispense Refill  . acetaminophen (TYLENOL) 500 MG tablet Take 500 mg by mouth every 6 (six) hours as needed.    . cholecalciferol (VITAMIN D3) 25 MCG (1000 UT) tablet Take 1,000 Units by mouth daily.    . fluticasone (FLONASE) 50 MCG/ACT nasal spray Place 2 sprays into both nostrils daily.    . Fluticasone Furoate (ARNUITY ELLIPTA) 100 MCG/ACT AEPB Inhale 1 puff into the lungs daily. 30 each 3  . hydroxypropyl methylcellulose / hypromellose (ISOPTO TEARS / GONIOVISC) 2.5 % ophthalmic solution Place 1 drop into both eyes 3 (three) times daily as needed for dry eyes.    . Multiple Vitamin (MULTI-VITAMIN) tablet Take 1 tablet by mouth daily.     . pantoprazole (PROTONIX) 20 MG tablet Take 20 mg by mouth daily.    . predniSONE (STERAPRED UNI-PAK 21 TAB) 10 MG (21) TBPK tablet See admin instructions.    Marland Kitchen Spacer/Aero-Holding Chambers (AEROCHAMBER PLUS) inhaler Use as instructed 1 each 2  . busPIRone (BUSPAR) 7.5 MG tablet Take 1 tablet (7.5 mg total) by  mouth 3 (three) times daily. (Patient taking differently: Take 7.5 mg by mouth daily as needed (anxiety). ) 90 tablet 3   No facility-administered medications prior to visit.    Review of Systems   Patient denies headache, fevers, malaise, unintentional weight loss, skin rash, eye pain, sinus congestion and sinus pain, sore throat, dysphagia,  hemoptysis , cough, dyspnea, wheezing, chest pain, palpitations, orthopnea, edema, abdominal pain, nausea, melena, diarrhea, constipation, flank pain, dysuria, hematuria, urinary  Frequency, nocturia, numbness, tingling, seizures,  Focal weakness, Loss of consciousness,  Tremor, insomnia, depression, anxiety, and suicidal ideation.      Objective:  BP (!) 142/78 (BP Location: Left Arm, Patient Position: Sitting)   Pulse 64   Temp (!) 97 F (36.1 C)   Ht 5' 0.98" (1.549 m)   Wt 131 lb 3.2 oz (59.5 kg)   SpO2 99%   BMI 24.80 kg/m   Physical Exam  General appearance: alert, cooperative and appears stated age Head: Normocephalic, without obvious abnormality, atraumatic Eyes: conjunctivae/corneas clear. PERRL, EOM's intact. Fundi benign. Ears: normal TM's and external ear canals both ears Nose: Nares normal. Septum midline. Mucosa normal. No drainage or sinus tenderness. Throat: lips, mucosa, and tongue normal; teeth and gums normal Neck: no adenopathy, no carotid  bruit, no JVD, supple, symmetrical, trachea midline and thyroid not enlarged, symmetric, no tenderness/mass/nodules Lungs: clear to auscultation bilaterally Breasts: normal appearance, no masses or tenderness Heart: regular rate and rhythm, S1, S2 normal, no murmur, click, rub or gallop Abdomen: soft, non-tender; bowel sounds normal; no masses,  no organomegaly Extremities: extremities normal, atraumatic, no cyanosis or edema Pulses: 2+ and symmetric Skin: Skin color, texture, turgor normal. No rashes or lesions Neurologic: Alert and oriented X 3, normal strength and tone. Normal  symmetric reflexes. Normal coordination and gait.    Assessment & Plan:   Problem List Items Addressed This Visit      Unprioritized   Acquired hypothyroidism    TSh has normalized without medication and needs repeating annually   Lab Results  Component Value Date   TSH 4.16 10/09/2018         Dysuria - Primary   Relevant Orders   Urinalysis, Routine w reflex microscopic   Urine Culture   Encounter for preventive health examination    age appropriate education and counseling updated, referrals for preventative services and immunizations addressed, dietary and smoking counseling addressed, most recent labs reviewed.  I have personally reviewed and have noted:  1) the patient's medical and social history 2) The pt's use of alcohol, tobacco, and illicit drugs 3) The patient's current medications and supplements 4) Functional ability including ADL's, fall risk, home safety risk, hearing and visual impairment 5) Diet and physical activities 6) Evidence for depression or mood disorder 7) The patient's height, weight, and BMI have been recorded in the chart  I have made referrals, and provided counseling and education based on review of the above      Fatigue   Relevant Orders   TSH   Generalized anxiety disorder    Continue buspirone and encouraged to increase dose to bid/tid      Relevant Medications   busPIRone (BUSPAR) 7.5 MG tablet   Insomnia due to anxiety and fear    Encouraged to increase use of buspirone to bid/tid       Varicose veins of both lower extremities without ulcer or inflammation   White coat syndrome without diagnosis of hypertension    Home readings are 130/80 or less consistently       Relevant Orders   Comprehensive metabolic panel   Lipid panel    Other Visit Diagnoses    Colon cancer screening       Relevant Orders   Cologuard   Vitamin D deficiency       Relevant Orders   VITAMIN D 25 Hydroxy (Vit-D Deficiency, Fractures)       I am having Kimberly Rojas maintain her cholecalciferol, AeroChamber Plus, Multi-Vitamin, hydroxypropyl methylcellulose / hypromellose, acetaminophen, Arnuity Ellipta, fluticasone, pantoprazole, predniSONE, and busPIRone.  Meds ordered this encounter  Medications  . busPIRone (BUSPAR) 7.5 MG tablet    Sig: Take 1 tablet (7.5 mg total) by mouth 3 (three) times daily.    Dispense:  90 tablet    Refill:  11    Medications Discontinued During This Encounter  Medication Reason  . busPIRone (BUSPAR) 7.5 MG tablet     Follow-up: No follow-ups on file.   Crecencio Mc, MD

## 2020-02-06 ENCOUNTER — Encounter: Payer: Self-pay | Admitting: Internal Medicine

## 2020-02-06 NOTE — Assessment & Plan Note (Signed)
TSh has normalized without medication and needs repeating annually   Lab Results  Component Value Date   TSH 4.16 10/09/2018

## 2020-02-06 NOTE — Assessment & Plan Note (Signed)
Continue buspirone and encouraged to increase dose to bid/tid

## 2020-02-06 NOTE — Assessment & Plan Note (Signed)

## 2020-02-06 NOTE — Assessment & Plan Note (Signed)
Encouraged to increase use of buspirone to bid/tid

## 2020-02-07 ENCOUNTER — Other Ambulatory Visit (INDEPENDENT_AMBULATORY_CARE_PROVIDER_SITE_OTHER): Payer: Medicare Other

## 2020-02-07 ENCOUNTER — Other Ambulatory Visit: Payer: Self-pay

## 2020-02-07 DIAGNOSIS — E559 Vitamin D deficiency, unspecified: Secondary | ICD-10-CM

## 2020-02-07 DIAGNOSIS — R5383 Other fatigue: Secondary | ICD-10-CM

## 2020-02-07 DIAGNOSIS — R300 Dysuria: Secondary | ICD-10-CM | POA: Diagnosis not present

## 2020-02-07 DIAGNOSIS — R030 Elevated blood-pressure reading, without diagnosis of hypertension: Secondary | ICD-10-CM | POA: Diagnosis not present

## 2020-02-07 LAB — COMPREHENSIVE METABOLIC PANEL
ALT: 13 U/L (ref 0–35)
AST: 20 U/L (ref 0–37)
Albumin: 4.1 g/dL (ref 3.5–5.2)
Alkaline Phosphatase: 98 U/L (ref 39–117)
BUN: 19 mg/dL (ref 6–23)
CO2: 28 mEq/L (ref 19–32)
Calcium: 9.2 mg/dL (ref 8.4–10.5)
Chloride: 104 mEq/L (ref 96–112)
Creatinine, Ser: 0.8 mg/dL (ref 0.40–1.20)
GFR: 69.76 mL/min (ref >60.00)
Glucose, Bld: 100 mg/dL — ABNORMAL HIGH (ref 70–99)
Potassium: 4 mEq/L (ref 3.5–5.1)
Sodium: 138 mEq/L (ref 135–145)
Total Bilirubin: 0.6 mg/dL (ref 0.2–1.2)
Total Protein: 6.7 g/dL (ref 6.0–8.3)

## 2020-02-07 LAB — LIPID PANEL
Cholesterol: 212 mg/dL — ABNORMAL HIGH (ref 0–200)
HDL: 58.7 mg/dL (ref >39.00)
LDL Cholesterol: 136 mg/dL — ABNORMAL HIGH (ref 0–99)
NonHDL: 153.73
Total CHOL/HDL Ratio: 4
Triglycerides: 91 mg/dL (ref 0.0–149.0)
VLDL: 18.2 mg/dL (ref 0.0–40.0)

## 2020-02-07 LAB — URINALYSIS, ROUTINE W REFLEX MICROSCOPIC
Bilirubin Urine: NEGATIVE
Ketones, ur: NEGATIVE
Leukocytes,Ua: NEGATIVE
Nitrite: NEGATIVE
Specific Gravity, Urine: 1.02 (ref 1.000–1.030)
Total Protein, Urine: NEGATIVE
Urine Glucose: NEGATIVE
Urobilinogen, UA: 0.2 (ref 0.0–1.0)
pH: 5.5 (ref 5.0–8.0)

## 2020-02-07 LAB — VITAMIN D 25 HYDROXY (VIT D DEFICIENCY, FRACTURES): VITD: 56.05 ng/mL (ref 30.00–100.00)

## 2020-02-07 LAB — TSH: TSH: 6.17 u[IU]/mL — ABNORMAL HIGH (ref 0.35–4.50)

## 2020-02-08 ENCOUNTER — Other Ambulatory Visit: Payer: Self-pay | Admitting: Internal Medicine

## 2020-02-08 DIAGNOSIS — E039 Hypothyroidism, unspecified: Secondary | ICD-10-CM

## 2020-02-08 LAB — URINE CULTURE
MICRO NUMBER:: 10560356
SPECIMEN QUALITY:: ADEQUATE

## 2020-02-16 DIAGNOSIS — Z1211 Encounter for screening for malignant neoplasm of colon: Secondary | ICD-10-CM | POA: Diagnosis not present

## 2020-02-16 LAB — COLOGUARD: Cologuard: NEGATIVE

## 2020-02-21 LAB — COLOGUARD: COLOGUARD: NEGATIVE

## 2020-02-21 LAB — EXTERNAL GENERIC LAB PROCEDURE: COLOGUARD: NEGATIVE

## 2020-02-21 LAB — HM COLONOSCOPY

## 2020-02-28 ENCOUNTER — Encounter: Payer: Self-pay | Admitting: Nurse Practitioner

## 2020-02-28 ENCOUNTER — Telehealth (INDEPENDENT_AMBULATORY_CARE_PROVIDER_SITE_OTHER): Payer: Medicare Other | Admitting: Nurse Practitioner

## 2020-02-28 ENCOUNTER — Other Ambulatory Visit: Payer: Self-pay

## 2020-02-28 ENCOUNTER — Telehealth: Payer: Self-pay | Admitting: Internal Medicine

## 2020-02-28 VITALS — Temp 97.4°F | Ht 60.98 in | Wt 131.0 lb

## 2020-02-28 DIAGNOSIS — L259 Unspecified contact dermatitis, unspecified cause: Secondary | ICD-10-CM | POA: Insufficient documentation

## 2020-02-28 MED ORDER — TRIAMCINOLONE ACETONIDE 0.1 % EX CREA
1.0000 | TOPICAL_CREAM | Freq: Two times a day (BID) | CUTANEOUS | 0 refills | Status: AC
Start: 2020-02-28 — End: 2020-03-06

## 2020-02-28 NOTE — Telephone Encounter (Signed)
Patient informed and verbalized understanding

## 2020-02-28 NOTE — Patient Instructions (Addendum)
Please apply a tiny dot of steroid cream- a little goes a long way - twice a day on the spots.  Do not use on the face.   Please call us back if the lesions do not go away - or if you get new lesions and it is spreading.   Stop anything new that you have been using or taking by mouth.We do not know what has caused this.    Contact Dermatitis Dermatitis is redness, soreness, and swelling (inflammation) of the skin. Contact dermatitis is a reaction to certain substances that touch the skin. Many different substances can cause contact dermatitis. There are two types of contact dermatitis:  Irritant contact dermatitis. This type is caused by something that irritates your skin, such as having dry hands from washing them too often with soap. This type does not require previous exposure to the substance for a reaction to occur. This is the most common type.  Allergic contact dermatitis. This type is caused by a substance that you are allergic to, such as poison ivy. This type occurs when you have been exposed to the substance (allergen) and develop a sensitivity to it. Dermatitis may develop soon after your first exposure to the allergen, or it may not develop until the next time you are exposed and every time thereafter. What are the causes? Irritant contact dermatitis is most commonly caused by exposure to:  Makeup.  Soaps.  Detergents.  Bleaches.  Acids.  Metal salts, such as nickel. Allergic contact dermatitis is most commonly caused by exposure to:  Poisonous plants.  Chemicals.  Jewelry.  Latex.  Medicines.  Preservatives in products, such as clothing. What increases the risk? You are more likely to develop this condition if you have:  A job that exposes you to irritants or allergens.  Certain medical conditions, such as asthma or eczema. What are the signs or symptoms? Symptoms of this condition may occur on your body anywhere the irritant has touched you or is touched  by you.  Symptoms include: ? Dryness or flaking. ? Redness. ? Cracks. ? Itching. ? Pain or a burning feeling. ? Blisters. ? Drainage of small amounts of blood or clear fluid from skin cracks. With allergic contact dermatitis, there may also be swelling in areas such as the eyelids, mouth, or genitals. How is this diagnosed? This condition is diagnosed with a medical history and physical exam.  A patch skin test may be performed to help determine the cause.  If the condition is related to your job, you may need to see an occupational medicine specialist. How is this treated? This condition is treated by checking for the cause of the reaction and protecting your skin from further contact. Treatment may also include:  Steroid creams or ointments. Oral steroid medicines may be needed in more severe cases.  Antibiotic medicines or antibacterial ointments, if a skin infection is present.  Antihistamine lotion or an antihistamine taken by mouth to ease itching.  A bandage (dressing). Follow these instructions at home: Skin care  Moisturize your skin as needed.  Apply cool compresses to the affected areas.  Try applying baking soda paste to your skin. Stir water into baking soda until it reaches a paste-like consistency.  Do not scratch your skin, and avoid friction to the affected area.  Avoid the use of soaps, perfumes, and dyes. Medicines  Take or apply over-the-counter and prescription medicines only as told by your health care provider.  If you were prescribed an antibiotic  medicine, take or apply the antibiotic as told by your health care provider. Do not stop using the antibiotic even if your condition improves. Bathing  Try taking a bath with: ? Epsom salts. Follow the instructions on the packaging. You can get these at your local pharmacy or grocery store. ? Baking soda. Pour a small amount into the bath as directed by your health care provider. ? Colloidal oatmeal.  Follow the instructions on the packaging. You can get this at your local pharmacy or grocery store.  Bathe less frequently, such as every other day.  Bathe in lukewarm water. Avoid using hot water. Bandage care  If you were given a bandage (dressing), change it as told by your health care provider.  Wash your hands with soap and water before and after you change your dressing. If soap and water are not available, use hand sanitizer. General instructions  Avoid the substance that caused your reaction. If you do not know what caused it, keep a journal to try to track what caused it. Write down: ? What you eat. ? What cosmetic products you use. ? What you drink. ? What you wear in the affected area. This includes jewelry.  Check the affected areas every day for signs of infection. Check for: ? More redness, swelling, or pain. ? More fluid or blood. ? Warmth. ? Pus or a bad smell.  Keep all follow-up visits as told by your health care provider. This is important. Contact a health care provider if:  Your condition does not improve with treatment.  Your condition gets worse.  You have signs of infection such as swelling, tenderness, redness, soreness, or warmth in the affected area.  You have a fever.  You have new symptoms. Get help right away if:  You have a severe headache, neck pain, or neck stiffness.  You vomit.  You feel very sleepy.  You notice red streaks coming from the affected area.  Your bone or joint underneath the affected area becomes painful after the skin has healed.  The affected area turns darker.  You have difficulty breathing. Summary  Dermatitis is redness, soreness, and swelling (inflammation) of the skin. Contact dermatitis is a reaction to certain substances that touch the skin.  Symptoms of this condition may occur on your body anywhere the irritant has touched you or is touched by you.  This condition is treated by figuring out what  caused the reaction and protecting your skin from further contact. Treatment may also include medicines and skin care.  Avoid the substance that caused your reaction. If you do not know what caused it, keep a journal to try to track what caused it.  Contact a health care provider if your condition gets worse or you have signs of infection such as swelling, tenderness, redness, soreness, or warmth in the affected area. This information is not intended to replace advice given to you by your health care provider. Make sure you discuss any questions you have with your health care provider. Document Revised: 12/09/2018 Document Reviewed: 03/04/2018 Elsevier Patient Education  2020 [employer].

## 2020-02-28 NOTE — Telephone Encounter (Signed)
Received faxed report of Patient's Cologuard report. States that this was negative. Results abstracted and sent to scan.

## 2020-02-28 NOTE — Progress Notes (Signed)
Virtual Visit via Video Note  This visit type was conducted due to national recommendations for restrictions regarding the COVID-19 pandemic (e.g. social distancing).  This format is felt to be most appropriate for this patient at this time.  All issues noted in this document were discussed and addressed.  No physical exam was performed (except for noted visual exam findings with Video Visits).   I connected with@ on 02/28/20 at  4:30 PM EDT by a video enabled telemedicine application or telephone and verified that I am speaking with the correct person using two identifiers. Location patient: home Location provider: work or home office Persons participating in the virtual visit: patient, provider   I discussed the limitations, risks, security and privacy concerns of performing an evaluation and management service by telephone and the availability of in person appointments. I also discussed with the patient that there may be a patient responsible charge related to this service. The patient expressed understanding and agreed to proceed.    Reason for visit: Rash  HPI: This 74 year old patient reports onset of rash that started about a week ago.  It starts out as red bumps- like a very itchy mosquito bite. She has applied calamine lotion which helps relieve the itching. These are red bumps like bug bites with 3 on her left arm, 1 on her right arm, 2 on right shoulder, and 1 on the left breast. None on the lower body. She has never had a rash like this.  The surrounding skin is normal.  She is not getting any new lesions.  She can recall no activities where she would have received any insect bites.  She has had no infestation in her house.  She did take a mattress pad to the laundromat to have it cleaned in their washing machines prior to this. She changed laundry softener.  She also started on Protonix and discontinued it.  She owns a dog who has had no flea problems.  She has been to the neighbor's  house who also has a dog to her knowledge there is been no flea problems.  She has not been traveling or been staying overnight in anyone's house.  She does not have problems with allergies or asthma.  ROS: See pertinent positives and negatives per HPI.  Past Medical History:  Diagnosis Date  . Anxiety   . Arthritis   . Chicken pox   . Dyspnea    denies  . Dysrhythmia    tachy at times  . Fatigue   . GERD (gastroesophageal reflux disease)   . Hair loss   . Heart murmur    hx of   . Heartburn   . Hyperlipidemia   . Migraines    had many years ago, not currently  . Peptic ulcer   . Thyroid nodule     Past Surgical History:  Procedure Laterality Date  . BREAST EXCISIONAL BIOPSY Left 2005-2010   benign  . BREAST SURGERY     left breast biopsy   . CESAREAN SECTION    . CHOLECYSTECTOMY    . DILATION AND CURETTAGE OF UTERUS    . KNEE ARTHROSCOPY WITH LATERAL MENISECTOMY Left 05/18/2019   Procedure: KNEE ARTHROSCOPY WITH DEBRIDEMENT AND PARTIAL  LATERAL MENISECTOMY;  Surgeon: Christena Flake, MD;  Location: [facility] OR[facility];  Service: Orthopedics;  Laterality: Left;  . SHOULDER OPEN ROTATOR CUFF REPAIR Right 03/31/2013   Procedure: RIGHT ROTATOR CUFF REPAIR SHOULDER OPEN;  Surgeon: Jacki Cones, MD;  Location: [facility];  Service: Orthopedics;  Laterality: Right;  With Patch and Anchor  . TONSILLECTOMY AND ADENOIDECTOMY      Family History  Problem Relation Age of Onset  . Breast cancer Mother 34  . Arthritis Mother   . Chronic fatigue Mother   . Diabetes Father   . Prostate cancer Father   . Diabetes Paternal Grandfather     SOCIAL HX: never smoked   Current Outpatient Medications:  .  acetaminophen (TYLENOL) 500 MG tablet, Take 500 mg by mouth every 6 (six) hours as needed., Disp: , Rfl:  .  busPIRone (BUSPAR) 7.5 MG tablet, Take 1 tablet (7.5 mg total) by mouth 3 (three) times daily., Disp: 90 tablet, Rfl: 11 .  cholecalciferol (VITAMIN D3) 25 MCG (1000 UT) tablet, Take  1,000 Units by mouth daily., Disp: , Rfl:  .  fluticasone (FLONASE) 50 MCG/ACT nasal spray, Place 2 sprays into both nostrils daily., Disp: , Rfl:  .  Fluticasone Furoate (ARNUITY ELLIPTA) 100 MCG/ACT AEPB, Inhale 1 puff into the lungs daily., Disp: 30 each, Rfl: 3 .  hydroxypropyl methylcellulose / hypromellose (ISOPTO TEARS / GONIOVISC) 2.5 % ophthalmic solution, Place 1 drop into both eyes 3 (three) times daily as needed for dry eyes., Disp: , Rfl:  .  Multiple Vitamin (MULTI-VITAMIN) tablet, Take 1 tablet by mouth daily. , Disp: , Rfl:  .  pantoprazole (PROTONIX) 20 MG tablet, Take 20 mg by mouth daily., Disp: , Rfl:  .  Spacer/Aero-Holding Chambers (AEROCHAMBER PLUS) inhaler, Use as instructed, Disp: 1 each, Rfl: 2 .  triamcinolone cream (KENALOG) 0.1 %, Apply 1 application topically 2 (two) times daily for 7 days., Disp: 15 g, Rfl: 0  EXAM:  VITALS per patient if applicable:  GENERAL: alert, oriented, appears well and in no acute distress  HEENT: atraumatic, conjunctiva clear, no obvious abnormalities on inspection of external nose and ears  NECK: normal movements of the head and neck  LUNGS: on inspection no signs of respiratory distress, breathing rate appears normal, no obvious gross SOB, gasping or wheezing  CV: no obvious cyanosis  MS: moves all visible extremities without noticeable abnormality  PSYCH/NEURO: pleasant and cooperative, no obvious depression or anxiety, speech and thought processing grossly intact  SKIN: She has 3 spots on the left arm, 1 on the right arm 2 on the right shoulder, and one on the left breast is not visualized.  These are red, large mosquito bite looking areas.  She said they did have a little center, but no center noted.  No surrounding erythema or signs of infection.  ASSESSMENT AND PLAN:  Discussed the following assessment and plan:  Contact dermatitis, unspecified contact dermatitis type, unspecified trigger  The etiology of her isolated  itchy red spots is unclear but it does look like a contact dermatitis. We discussed if she wore blouse or sweater that had been in storage- as the lesions are on her upper torso only. She cannot recall any such thing. No lesions on legs. This speaks against fleas and this does look like flea bites. She has had no insects that she has noted in the house.  No wheezing or systemic issues.  Please apply a tiny dot of steroid cream- a little goes a long way twice a day on the spots.  Do not use on the face.   Please call us back if the lesions do not go away - or if you get new lesions and it is spreading.   Stop anything new that you have  been using or taking by mouth.    I discussed the assessment and treatment plan with the patient. The patient was provided an opportunity to ask questions and all were answered. The patient agreed with the plan and demonstrated an understanding of the instructions.   The patient was advised to call back or seek an in-person evaluation if the symptoms worsen or if the condition fails to improve as anticipated.   Amedeo Kinsman, NP Adult Nurse Practitioner [facility] [employer] [phone]

## 2020-02-29 ENCOUNTER — Encounter: Payer: Self-pay | Admitting: Nurse Practitioner

## 2020-03-07 NOTE — Addendum Note (Signed)
Addended by: Amedeo Kinsman A on: 03/07/2020 09:52 AM   Modules accepted: Orders

## 2020-03-15 ENCOUNTER — Encounter: Payer: Self-pay | Admitting: Physician Assistant

## 2020-03-15 ENCOUNTER — Other Ambulatory Visit: Payer: Self-pay

## 2020-03-15 ENCOUNTER — Ambulatory Visit: Payer: Medicare Other

## 2020-03-15 ENCOUNTER — Ambulatory Visit: Payer: Medicare Other | Admitting: Physician Assistant

## 2020-03-15 VITALS — BP 144/76 | HR 79 | Ht 61.0 in | Wt 127.0 lb

## 2020-03-15 DIAGNOSIS — R82998 Other abnormal findings in urine: Secondary | ICD-10-CM | POA: Diagnosis not present

## 2020-03-15 DIAGNOSIS — R300 Dysuria: Secondary | ICD-10-CM | POA: Diagnosis not present

## 2020-03-15 NOTE — Progress Notes (Signed)
03/15/2020 12:55 PM   Kimberly Rojas 11-11-1945 [id number]  CC: Chief Complaint  Patient presents with  . Other    Foamy urine    HPI: Kimberly Rojas is a 74 y.o. female with PMH dysuria and mixed incontinence who presents today for evaluation of foamy urine.  Today she reports a 1 week history of foamy urine. She denies dysuria, urgency, and frequency.   Per chart review, creatinine stable at 0.80 on 02/07/2020.  In-office UA today positive for 1+ blood; urine microscopy pan-negative.  PMH: Past Medical History:  Diagnosis Date  . Anxiety   . Arthritis   . Chicken pox   . Dyspnea    denies  . Dysrhythmia    tachy at times  . Fatigue   . GERD (gastroesophageal reflux disease)   . Hair loss   . Heart murmur    hx of   . Heartburn   . Hyperlipidemia   . Migraines    had many years ago, not currently  . Peptic ulcer   . Thyroid nodule     Surgical History: Past Surgical History:  Procedure Laterality Date  . BREAST EXCISIONAL BIOPSY Left 2005-2010   benign  . BREAST SURGERY     left breast biopsy   . CESAREAN SECTION    . CHOLECYSTECTOMY    . DILATION AND CURETTAGE OF UTERUS    . KNEE ARTHROSCOPY WITH LATERAL MENISECTOMY Left 05/18/2019   Procedure: KNEE ARTHROSCOPY WITH DEBRIDEMENT AND PARTIAL  LATERAL MENISECTOMY;  Surgeon: Christena Flake, MD;  Location: [facility] OR[facility];  Service: Orthopedics;  Laterality: Left;  . SHOULDER OPEN ROTATOR CUFF REPAIR Right 03/31/2013   Procedure: RIGHT ROTATOR CUFF REPAIR SHOULDER OPEN;  Surgeon: Jacki Cones, MD;  Location: [facility];  Service: Orthopedics;  Laterality: Right;  With Patch and Anchor  . TONSILLECTOMY AND ADENOIDECTOMY      Home Medications:  Allergies as of 03/15/2020      Reactions   Tramadol Shortness Of Breath   Ibuprofen Other (See Comments)   Gas pains   Pantoprazole Rash   Potential      Medication List       Accurate as of March 15, 2020 12:55 PM. If you have any questions, ask your nurse or  doctor.        STOP taking these medications   pantoprazole 20 MG tablet Commonly known as: PROTONIX Stopped by: Carman Ching, PA-C     TAKE these medications   acetaminophen 500 MG tablet Commonly known as: TYLENOL Take 500 mg by mouth every 6 (six) hours as needed.   AeroChamber Plus inhaler Use as instructed   Arnuity Ellipta 100 MCG/ACT Aepb Generic drug: Fluticasone Furoate Inhale 1 puff into the lungs daily.   busPIRone 7.5 MG tablet Commonly known as: BUSPAR Take 1 tablet (7.5 mg total) by mouth 3 (three) times daily.   cholecalciferol 25 MCG (1000 UNIT) tablet Commonly known as: VITAMIN D3 Take 1,000 Units by mouth daily.   fluticasone 50 MCG/ACT nasal spray Commonly known as: FLONASE Place 2 sprays into both nostrils daily.   hydroxypropyl methylcellulose / hypromellose 2.5 % ophthalmic solution Commonly known as: ISOPTO TEARS / GONIOVISC Place 1 drop into both eyes 3 (three) times daily as needed for dry eyes.   Multi-Vitamin tablet Take 1 tablet by mouth daily.       Allergies:  Allergies  Allergen Reactions  . Tramadol Shortness Of Breath  . Ibuprofen Other (See Comments)  Gas pains  . Pantoprazole Rash    Potential    Family History: Family History  Problem Relation Age of Onset  . Breast cancer Mother 77  . Arthritis Mother   . Chronic fatigue Mother   . Diabetes Father   . Prostate cancer Father   . Diabetes Paternal Grandfather     Social History:   reports that she has never smoked. She has never used smokeless tobacco. She reports current alcohol use of about 1.0 standard drink of alcohol per week. She reports that she does not use drugs.  Physical Exam: BP (!) 144/76   Pulse 79   Ht 5\' 1"  (1.549 m)   Wt 127 lb (57.6 kg)   BMI 24.00 kg/m   Constitutional:  Alert and oriented, no acute distress, nontoxic appearing HEENT: [place], AT Cardiovascular: No clubbing, cyanosis, or edema Respiratory: Normal respiratory  effort, no increased work of breathing Skin: No rashes, bruises or suspicious lesions Neurologic: Grossly intact, no focal deficits, moving all 4 extremities Psychiatric: Normal mood and affect  Laboratory Data: Results for orders placed or performed in visit on 03/15/20  Microscopic Examination   Urine  Result Value Ref Range   WBC, UA 0-5 0 - 5 /hpf   RBC 0-2 0 - 2 /hpf   Epithelial Cells (non renal) 0-10 0 - 10 /hpf   Bacteria, UA None seen None seen/Few  Urinalysis, Complete  Result Value Ref Range   Specific Gravity, UA 1.010 1.005 - 1.030   pH, UA 5.5 5.0 - 7.5   Color, UA Yellow Yellow   Appearance Ur Clear Clear   Leukocytes,UA Negative Negative   Protein,UA Negative Negative/Trace   Glucose, UA Negative Negative   Ketones, UA Negative Negative   RBC, UA 1+ (A) Negative   Bilirubin, UA Negative Negative   Urobilinogen, Ur 0.2 0.2 - 1.0 mg/dL   Nitrite, UA Negative Negative   Microscopic Examination See below:    Assessment & Plan:   1. Foamy urine UA without proteinuria, labs with stable creatinine WNL reassuring for renal etiology. I reassured the patient that this is a benign phenomenon that will likely resolve on its own. No further intervention indicated. Patient to follow up PRN. - Urinalysis, Complete   Return if symptoms worsen or fail to improve.  03/17/20, PA-C  [facility] [address], [address] [place], [place] [phone]

## 2020-03-15 NOTE — Patient Instructions (Signed)
   Follow-up with our office as needed.  Please call and ask to speak with a nurse if you develop questions or concerns.  

## 2020-03-16 LAB — URINALYSIS, COMPLETE
Bilirubin, UA: NEGATIVE
Glucose, UA: NEGATIVE
Ketones, UA: NEGATIVE
Leukocytes,UA: NEGATIVE
Nitrite, UA: NEGATIVE
Protein,UA: NEGATIVE
Specific Gravity, UA: 1.01 (ref 1.005–1.030)
Urobilinogen, Ur: 0.2 mg/dL (ref 0.2–1.0)
pH, UA: 5.5 (ref 5.0–7.5)

## 2020-03-16 LAB — MICROSCOPIC EXAMINATION: Bacteria, UA: NONE SEEN

## 2020-03-21 ENCOUNTER — Other Ambulatory Visit: Payer: Self-pay | Admitting: Podiatry

## 2020-03-21 ENCOUNTER — Ambulatory Visit (INDEPENDENT_AMBULATORY_CARE_PROVIDER_SITE_OTHER): Payer: Medicare Other

## 2020-03-21 ENCOUNTER — Ambulatory Visit: Payer: Medicare Other | Admitting: Podiatry

## 2020-03-21 ENCOUNTER — Other Ambulatory Visit: Payer: Self-pay

## 2020-03-21 ENCOUNTER — Encounter: Payer: Self-pay | Admitting: Podiatry

## 2020-03-21 DIAGNOSIS — M19071 Primary osteoarthritis, right ankle and foot: Secondary | ICD-10-CM

## 2020-03-21 DIAGNOSIS — M779 Enthesopathy, unspecified: Secondary | ICD-10-CM

## 2020-03-21 NOTE — Progress Notes (Signed)
   HPI: 74 y.o. female presenting today as a new patient for evaluation of right ankle pain.  Patient has been dealing with right ankle pain for the last 2 weeks however she has been resting her ankle and today it is actually not near as painful as what it used to be.  She states that she has occasional ankle pain based on her activity throughout the day.  She has a very busy day she will notice increased pain at the end of the day with some mild swelling.  She denies injury.  She presents for further treatment and evaluation  Past Medical History:  Diagnosis Date  . Anxiety   . Arthritis   . Chicken pox   . Dyspnea    denies  . Dysrhythmia    tachy at times  . Fatigue   . GERD (gastroesophageal reflux disease)   . Hair loss   . Heart murmur    hx of   . Heartburn   . Hyperlipidemia   . Migraines    had many years ago, not currently  . Peptic ulcer   . Thyroid nodule      Physical Exam: General: The patient is alert and oriented x3 in no acute distress.  Dermatology: Skin is warm, dry and supple bilateral lower extremities. Negative for open lesions or macerations.  Vascular: Palpable pedal pulses bilaterally. No edema or erythema noted. Capillary refill within normal limits.  Neurological: Epicritic and protective threshold grossly intact bilaterally.   Musculoskeletal Exam: Range of motion within normal limits to all pedal and ankle joints bilateral. Muscle strength 5/5 in all groups bilateral.  There is some pain on palpation to the lateral gutter of the right ankle joint  Radiographic Exam:  Normal osseous mineralization. Joint spaces preserved with exception of some mild joint space narrowing and spurring to the right ankle joint. No fracture/dislocation/boney destruction.     Assessment: 1.  DJD right ankle with capsulitis   Plan of Care:  1. Patient evaluated. X-Rays reviewed.  2.  Patient declined any oral medication or anti-inflammatory steroidal injections  today.  She says that her symptoms are not very severe 3.  Compression anklet dispensed.  Wear daily 4.  Recommend good supportive shoes 5.  Return to clinic as needed      Felecia Shelling, DPM [facility]  Dr. Felecia Shelling, DPM    [address], [place] [address]                Office [phone]  Fax [phone]

## 2020-03-24 DIAGNOSIS — M5416 Radiculopathy, lumbar region: Secondary | ICD-10-CM | POA: Diagnosis not present

## 2020-03-24 DIAGNOSIS — M5136 Other intervertebral disc degeneration, lumbar region: Secondary | ICD-10-CM | POA: Diagnosis not present

## 2020-03-24 DIAGNOSIS — M48062 Spinal stenosis, lumbar region with neurogenic claudication: Secondary | ICD-10-CM | POA: Diagnosis not present

## 2020-03-28 DIAGNOSIS — J3081 Allergic rhinitis due to animal (cat) (dog) hair and dander: Secondary | ICD-10-CM | POA: Diagnosis not present

## 2020-03-28 DIAGNOSIS — J4530 Mild persistent asthma, uncomplicated: Secondary | ICD-10-CM | POA: Diagnosis not present

## 2020-03-28 DIAGNOSIS — J301 Allergic rhinitis due to pollen: Secondary | ICD-10-CM | POA: Diagnosis not present

## 2020-03-28 DIAGNOSIS — Z1211 Encounter for screening for malignant neoplasm of colon: Secondary | ICD-10-CM | POA: Diagnosis not present

## 2020-03-28 DIAGNOSIS — J3089 Other allergic rhinitis: Secondary | ICD-10-CM | POA: Diagnosis not present

## 2020-03-28 DIAGNOSIS — K219 Gastro-esophageal reflux disease without esophagitis: Secondary | ICD-10-CM | POA: Diagnosis not present

## 2020-04-13 DIAGNOSIS — M5136 Other intervertebral disc degeneration, lumbar region: Secondary | ICD-10-CM | POA: Diagnosis not present

## 2020-04-13 DIAGNOSIS — M48062 Spinal stenosis, lumbar region with neurogenic claudication: Secondary | ICD-10-CM | POA: Diagnosis not present

## 2020-04-13 DIAGNOSIS — M5416 Radiculopathy, lumbar region: Secondary | ICD-10-CM | POA: Diagnosis not present

## 2020-04-14 ENCOUNTER — Telehealth: Payer: Self-pay | Admitting: Internal Medicine

## 2020-04-14 NOTE — Telephone Encounter (Signed)
Patient stated she had a cortisone shot and last night she was becoming dizzy and it is still currently going on. Patient stated when she sits down she feels better but when she stands and moves her her head around or move her head down it begins to become worse, and had a head ache but was informed that was due to the cortisone. no nausea, fever, chills, SOB, Heart palpations, and blurry vision. Patients BP was 128/72 and 117/65 later on today. Patient does have a history of vertigo. Patient stated she hasn't taking any medication for her sx. Informed patient I would let PCP know of the above and if sx worsen and have not heard back from [place] to go to UC/ED. She stated if her sx worsen she will go.

## 2020-04-14 NOTE — Telephone Encounter (Signed)
Patient went to [facility] yesterday and was given a cortisone injection in her lower back. Patient said last evening she started to get dizzy and is still dizzy. She called [facility] and was told to call her primary care provider.

## 2020-04-14 NOTE — Telephone Encounter (Signed)
Needs to be triaged:  Dizziness or true vertigo?  What type of activities cause it  Has she had her BP checked?

## 2020-05-04 DIAGNOSIS — R21 Rash and other nonspecific skin eruption: Secondary | ICD-10-CM | POA: Diagnosis not present

## 2020-05-31 ENCOUNTER — Ambulatory Visit: Payer: Medicare Other

## 2020-06-02 ENCOUNTER — Ambulatory Visit (INDEPENDENT_AMBULATORY_CARE_PROVIDER_SITE_OTHER): Payer: Medicare Other

## 2020-06-02 VITALS — BP 118/69 | HR 63 | Ht 61.0 in | Wt 127.0 lb

## 2020-06-02 DIAGNOSIS — Z0000 Encounter for general adult medical examination without abnormal findings: Secondary | ICD-10-CM | POA: Diagnosis not present

## 2020-06-02 NOTE — Progress Notes (Addendum)
Subjective:   Kimberly Rojas is a 74 y.o. female who presents for Medicare Annual (Subsequent) preventive examination.  Review of Systems    No ROS.  Medicare Wellness Virtual Visit.   Cardiac Risk Factors include: advanced age (>79men, >68 women)     Objective:    Today's Vitals   06/02/20 1335  BP: 118/69  Pulse: 63  Weight: 127 lb (57.6 kg)  Height: 5\' 1"  (1.549 m)   Body mass index is 24 kg/m.  Advanced Directives 05/18/2019 05/11/2019 03/15/2019 09/12/2017 09/24/2016 03/31/2013 03/26/2013  Does Patient Have a Medical Advance Directive? No No No No No Patient does not have advance directive Patient does not have advance directive  Would patient like information on creating a medical advance directive? No - Patient declined - Yes (MAU/Ambulatory/Procedural Areas - Information given) No - Patient declined Yes (MAU/Ambulatory/Procedural Areas - Information given) - -  Pre-existing out of facility DNR order (yellow form or pink MOST form) - - - - - No -    Current Medications (verified) Outpatient Encounter Medications as of 06/02/2020  Medication Sig   acetaminophen (TYLENOL) 500 MG tablet Take 500 mg by mouth every 6 (six) hours as needed.   busPIRone (BUSPAR) 7.5 MG tablet Take 1 tablet (7.5 mg total) by mouth 3 (three) times daily.   cholecalciferol (VITAMIN D3) 25 MCG (1000 UT) tablet Take 1,000 Units by mouth daily.   Fluticasone Furoate (ARNUITY ELLIPTA) 100 MCG/ACT AEPB Inhale 1 puff into the lungs daily.   Multiple Vitamin (MULTI-VITAMIN) tablet Take 1 tablet by mouth daily.    Spacer/Aero-Holding Chambers (AEROCHAMBER PLUS) inhaler Use as instructed   No facility-administered encounter medications on file as of 06/02/2020.    Allergies (verified) Tramadol, Ibuprofen, and Pantoprazole   History: Past Medical History:  Diagnosis Date   Anxiety    Arthritis    Chicken pox    Dyspnea    denies   Dysrhythmia    tachy at times   Fatigue    GERD (gastroesophageal  reflux disease)    Hair loss    Heart murmur    hx of    Heartburn    Hyperlipidemia    Migraines    had many years ago, not currently   Peptic ulcer    Thyroid nodule    Past Surgical History:  Procedure Laterality Date   BREAST EXCISIONAL BIOPSY Left 2005-2010   benign   BREAST SURGERY     left breast biopsy    CESAREAN SECTION     CHOLECYSTECTOMY     DILATION AND CURETTAGE OF UTERUS     KNEE ARTHROSCOPY WITH LATERAL MENISECTOMY Left 05/18/2019   Procedure: KNEE ARTHROSCOPY WITH DEBRIDEMENT AND PARTIAL  LATERAL MENISECTOMY;  Surgeon: 05/20/2019, MD;  Location: [facility] ORS;  Service: Orthopedics;  Laterality: Left;   SHOULDER OPEN ROTATOR CUFF REPAIR Right 03/31/2013   Procedure: RIGHT ROTATOR CUFF REPAIR SHOULDER OPEN;  Surgeon: 04/02/2013, MD;  Location: [facility];  Service: Orthopedics;  Laterality: Right;  With [facility]   TONSILLECTOMY AND ADENOIDECTOMY     Family History  Problem Relation Age of Onset   Breast cancer Mother 44   Arthritis Mother    Chronic fatigue Mother    Diabetes Father    Prostate cancer Father    Diabetes Paternal Grandfather    Social History   Socioeconomic History   Marital status: Widowed    Spouse name: Not on file   Number of children:  Not on file   Years of education: Not on file   Highest education level: Not on file  Occupational History   Occupation: [email or web address]    Comment: retired  Tobacco Use   Smoking status: Never Smoker   Smokeless tobacco: Never Used  Building services engineer Use: Never used  Substance and Sexual Activity   Alcohol use: Yes    Alcohol/week: 1.0 standard drink    Types: 1 Glasses of wine per week   Drug use: No   Sexual activity: Not Currently  Other Topics Concern   Not on file  Social History Narrative   Widower as of recent    Retired   HS education   Caffeine- 3 cups tea, no soda or coffee    Exercise- 3 times, walking   2 children- 1 passed    Social Determinants of Manufacturing engineer Strain: Low Risk    Difficulty of Paying Living Expenses: Not hard at all  Food Insecurity: No Food Insecurity   Worried About Programme researcher, broadcasting/film/video in the Last Year: Never true   Ran Out of Food in the Last Year: Never true  Transportation Needs: No Transportation Needs   Lack of Transportation (Medical): No   Lack of Transportation (Non-Medical): No  Physical Activity:    Days of Exercise per Week: Not on file   Minutes of Exercise per Session: Not on file  Stress: No Stress Concern Present   Feeling of Stress : Not at all  Social Connections: Unknown   Frequency of Communication with Friends and Family: More than three times a week   Frequency of Social Gatherings with Friends and Family: Not on file   Attends Religious Services: Not on Scientist, clinical (histocompatibility and immunogenetics) or Organizations: Not on file   Attends Banker Meetings: Not on file   Marital Status: Not on file    Tobacco Counseling Counseling given: Not Answered   Clinical Intake:  Pre-visit preparation completed: Yes        Diabetes: No  How often do you need to have someone help you when you read instructions, pamphlets, or other written materials from your doctor or pharmacy?: 1 - Never   Interpreter Needed?: No      Activities of Daily Living In your present state of health, do you have any difficulty performing the following activities: 06/02/2020  Hearing? N  Vision? N  Difficulty concentrating or making decisions? N  Walking or climbing stairs? Y  Comment Chronic knee pain  Dressing or bathing? N  Doing errands, shopping? N  Preparing Food and eating ? N  Using the Toilet? N  In the past six months, have you accidently leaked urine? N  Do you have problems with loss of bowel control? N  Managing your Medications? N  Managing your Finances? N  Housekeeping or managing your Housekeeping? N  Some recent data might be hidden    Patient Care Team: Sherlene Shams, MD as PCP - General (Internal Medicine)  Indicate any recent Medical Services you may have received from other than [facility] providers in the past year (date may be approximate).     Assessment:   This is a routine wellness examination for Kimberly Rojas.  I connected with Kimberly Rojas today by telephone and verified that I am speaking with the correct person using two identifiers. Location patient: home Location provider: work Persons participating in the virtual visit: patient, Engineer, civil (consulting).  I discussed the limitations, risks, security and privacy concerns of performing an evaluation and management service by telephone and the availability of in person appointments. The patient expressed understanding and verbally consented to this telephonic visit.    Interactive audio and video telecommunications were attempted between this provider and patient, however failed, due to patient having technical difficulties OR patient did not have access to video capability.  We continued and completed visit with audio only.  Some vital signs may be absent or patient reported.   Hearing/Vision screen  Hearing Screening   125Hz  250Hz  500Hz  1000Hz  2000Hz  3000Hz  4000Hz  6000Hz  8000Hz   Right ear:           Left ear:           Comments: Patient is able to hear conversational tones without difficulty.  No issues reported.  Vision Screening Comments: Wears corrective lenses  Visual acuity not assessed, virtual visit.   Dietary issues and exercise activities discussed: Current Exercise Habits: Home exercise routine, Type of exercise: walking, Intensity: Mild Healthy diet Good water intake  Goals       Patient Stated     Follow up with Primary Care Provider (pt-stated)      As needed       Other     Increase physical activity      Pace self Walk more for exercise        Depression Screen PHQ 2/9 Scores 06/02/2020 03/15/2019 01/13/2019 05/29/2018 05/21/2017 09/24/2016 11/01/2015  PHQ - 2 Score 0 0 0 0 0 0 0  PHQ- 9  Score - - 1 1 3  - -    Fall Risk Fall Risk  06/02/2020 07/23/2019 03/15/2019 01/13/2019 05/29/2018  Falls in the past year? 0 1 0 0 No  Number falls in past yr: 0 0 - 0 -  Injury with Fall? - 0 - 0 -  Follow up Falls evaluation completed Falls evaluation completed - Falls evaluation completed -   Handrails in use when climbing stairs? Yes Home free of loose throw rugs in walkways, pet beds, electrical cords, etc? Yes Adequate lighting in your home to reduce risk of falls? Yes   ASSISTIVE DEVICES UTILIZED TO PREVENT FALLS: Use of a cane, walker or w/c? No   TIMED UP AND GO: Was the test performed? No .  Virtual visit.   Cognitive Function:  Patient is alert and oriented x3.  Denies difficulty focusing, making decisions, memory loss.  Enjoys brain challenging activities for brain health.    6CIT Screen 03/15/2019 09/24/2016  What Year? 0 points 0 points  What month? 0 points 0 points  What time? 0 points 0 points  Count back from 20 0 points 0 points  Months in reverse 0 points 0 points  Repeat phrase 0 points 0 points  Total Score 0 0    Immunizations Immunization History  Administered Date(s) Administered   Influenza,inj,Quad PF,6+ Mos 06/11/2019   Influenza-Unspecified 07/18/2015, 06/11/2019   PFIZER SARS-COV-2 Vaccination 11/12/2019, 11/30/2019   Pneumococcal Conjugate-13 11/26/2017   Tdap 10/31/2013    Health Maintenance Health Maintenance  Topic Date Due   PNA vac Low Risk Adult (2 of 2 - PPSV23) 11/27/2018   INFLUENZA VACCINE  04/02/2020   MAMMOGRAM  08/12/2020   TETANUS/TDAP  11/01/2023   DEXA SCAN  Completed   COVID-19 Vaccine  Completed   Hepatitis C Screening  Completed   Dental Screening: Recommended annual dental exams for proper oral hygiene.  Community Resource Referral / Chronic  Care Management: CRR required this visit?  No   CCM required this visit?  No      Plan:    Keep all routine maintenance appointments.   I have personally reviewed and  noted the following in the patients chart:   Medical and social history Use of alcohol, tobacco or illicit drugs  Current medications and supplements Functional ability and status Nutritional status Physical activity Advanced directives List of other physicians Hospitalizations, surgeries, and ER visits in previous 12 months Vitals Screenings to include cognitive, depression, and falls Referrals and appointments  In addition, I have reviewed and discussed with patient certain preventive protocols, quality metrics, and best practice recommendations. A written personalized care plan for preventive services as well as general preventive health recommendations were provided to patient via mychart.     OBrien-Blaney, Avontae Burkhead L, LPN   [id number]    I have reviewed the above information and agree with above.   Duncan Dull, MD

## 2020-06-02 NOTE — Patient Instructions (Addendum)
Kimberly Rojas , Thank you for taking time to come for your Medicare Wellness Visit. I appreciate your ongoing commitment to your health goals. Please review the following plan we discussed and let me know if I can assist you in the future.   These are the goals we discussed: Goals      Patient Stated   .  Follow up with Primary Care Provider (pt-stated)      As needed       Other   .  Increase physical activity      Pace self Walk more for exercise        This is a list of the screening recommended for you and due dates:  Health Maintenance  Topic Date Due  . Pneumonia vaccines (2 of 2 - PPSV23) 11/27/2018  . Flu Shot  04/02/2020  . Mammogram  08/12/2020  . Tetanus Vaccine  11/01/2023  . DEXA scan (bone density measurement)  Completed  . COVID-19 Vaccine  Completed  .  Hepatitis C: One time screening is recommended by Center for Disease Control  (CDC) for  adults born from [birth year] through 1967.   Completed    Immunizations Immunization History  Administered Date(s) Administered  . Influenza,inj,Quad PF,6+ Mos 06/11/2019  . Influenza-Unspecified 07/18/2015, 06/11/2019  . PFIZER SARS-COV-2 Vaccination 11/12/2019, 11/30/2019  . Pneumococcal Conjugate-13 11/26/2017  . Tdap 10/31/2013   Follow up in one year for your annual wellness visit.   Preventive Care 67 Years and Older, Female Preventive care refers to lifestyle choices and visits with your health care provider that can promote health and wellness. What does preventive care include?  A yearly physical exam. This is also called an annual well check.  Dental exams once or twice a year.  Routine eye exams. Ask your health care provider how often you should have your eyes checked.  Personal lifestyle choices, including:  Daily care of your teeth and gums.  Regular physical activity.  Eating a healthy diet.  Avoiding tobacco and drug use.  Limiting alcohol use.  Practicing safe sex.  Taking low-dose  aspirin every day.  Taking vitamin and mineral supplements as recommended by your health care provider. What happens during an annual well check? The services and screenings done by your health care provider during your annual well check will depend on your age, overall health, lifestyle risk factors, and family history of disease. Counseling  Your health care provider may ask you questions about your:  Alcohol use.  Tobacco use.  Drug use.  Emotional well-being.  Home and relationship well-being.  Sexual activity.  Eating habits.  History of falls.  Memory and ability to understand (cognition).  Work and work Astronomer.  Reproductive health. Screening  You may have the following tests or measurements:  Height, weight, and BMI.  Blood pressure.  Lipid and cholesterol levels. These may be checked every 5 years, or more frequently if you are over 74 years old.  Skin check.  Lung cancer screening. You may have this screening every year starting at age 30 if you have a 30-pack-year history of smoking and currently smoke or have quit within the past 15 years.  Fecal occult blood test (FOBT) of the stool. You may have this test every year starting at age 60.  Flexible sigmoidoscopy or colonoscopy. You may have a sigmoidoscopy every 5 years or a colonoscopy every 10 years starting at age 77.  Hepatitis C blood test.  Hepatitis B blood test.  Sexually transmitted disease (  STD) testing.  Diabetes screening. This is done by checking your blood sugar (glucose) after you have not eaten for a while (fasting). You may have this done every 1-3 years.  Bone density scan. This is done to screen for osteoporosis. You may have this done starting at age 59.  Mammogram. This may be done every 1-2 years. Talk to your health care provider about how often you should have regular mammograms. Talk with your health care provider about your test results, treatment options, and if  necessary, the need for more tests. Vaccines  Your health care provider may recommend certain vaccines, such as:  Influenza vaccine. This is recommended every year.  Tetanus, diphtheria, and acellular pertussis (Tdap, Td) vaccine. You may need a Td booster every 10 years.  Zoster vaccine. You may need this after age 84.  Pneumococcal 13-valent conjugate (PCV13) vaccine. One dose is recommended after age 32.  Pneumococcal polysaccharide (PPSV23) vaccine. One dose is recommended after age 95. Talk to your health care provider about which screenings and vaccines you need and how often you need them. This information is not intended to replace advice given to you by your health care provider. Make sure you discuss any questions you have with your health care provider. Document Released: 09/15/2015 Document Revised: 05/08/2016 Document Reviewed: 06/20/2015 Elsevier Interactive Patient Education  2017 [employer].  Fall Prevention in the Home Falls can cause injuries. They can happen to people of all ages. There are many things you can do to make your home safe and to help prevent falls. What can I do on the outside of my home?  Regularly fix the edges of walkways and driveways and fix any cracks.  Remove anything that might make you trip as you walk through a door, such as a raised step or threshold.  Trim any bushes or trees on the path to your home.  Use bright outdoor lighting.  Clear any walking paths of anything that might make someone trip, such as rocks or tools.  Regularly check to see if handrails are loose or broken. Make sure that both sides of any steps have handrails.  Any raised decks and porches should have guardrails on the edges.  Have any leaves, snow, or ice cleared regularly.  Use sand or salt on walking paths during winter.  Clean up any spills in your garage right away. This includes oil or grease spills. What can I do in the bathroom?  Use night  lights.  Install grab bars by the toilet and in the tub and shower. Do not use towel bars as grab bars.  Use non-skid mats or decals in the tub or shower.  If you need to sit down in the shower, use a plastic, non-slip stool.  Keep the floor dry. Clean up any water that spills on the floor as soon as it happens.  Remove soap buildup in the tub or shower regularly.  Attach bath mats securely with double-sided non-slip rug tape.  Do not have throw rugs and other things on the floor that can make you trip. What can I do in the bedroom?  Use night lights.  Make sure that you have a light by your bed that is easy to reach.  Do not use any sheets or blankets that are too big for your bed. They should not hang down onto the floor.  Have a firm chair that has side arms. You can use this for support while you get dressed.  Do  not have throw rugs and other things on the floor that can make you trip. What can I do in the kitchen?  Clean up any spills right away.  Avoid walking on wet floors.  Keep items that you use a lot in easy-to-reach places.  If you need to reach something above you, use a strong step stool that has a grab bar.  Keep electrical cords out of the way.  Do not use floor polish or wax that makes floors slippery. If you must use wax, use non-skid floor wax.  Do not have throw rugs and other things on the floor that can make you trip. What can I do with my stairs?  Do not leave any items on the stairs.  Make sure that there are handrails on both sides of the stairs and use them. Fix handrails that are broken or loose. Make sure that handrails are as long as the stairways.  Check any carpeting to make sure that it is firmly attached to the stairs. Fix any carpet that is loose or worn.  Avoid having throw rugs at the top or bottom of the stairs. If you do have throw rugs, attach them to the floor with carpet tape.  Make sure that you have a light switch at the  top of the stairs and the bottom of the stairs. If you do not have them, ask someone to add them for you. What else can I do to help prevent falls?  Wear shoes that:  Do not have high heels.  Have rubber bottoms.  Are comfortable and fit you well.  Are closed at the toe. Do not wear sandals.  If you use a stepladder:  Make sure that it is fully opened. Do not climb a closed stepladder.  Make sure that both sides of the stepladder are locked into place.  Ask someone to hold it for you, if possible.  Clearly mark and make sure that you can see:  Any grab bars or handrails.  First and last steps.  Where the edge of each step is.  Use tools that help you move around (mobility aids) if they are needed. These include:  Canes.  Walkers.  Scooters.  Crutches.  Turn on the lights when you go into a dark area. Replace any light bulbs as soon as they burn out.  Set up your furniture so you have a clear path. Avoid moving your furniture around.  If any of your floors are uneven, fix them.  If there are any pets around you, be aware of where they are.  Review your medicines with your doctor. Some medicines can make you feel dizzy. This can increase your chance of falling. Ask your doctor what other things that you can do to help prevent falls. This information is not intended to replace advice given to you by your health care provider. Make sure you discuss any questions you have with your health care provider. Document Released: 06/15/2009 Document Revised: 01/25/2016 Document Reviewed: 09/23/2014 Elsevier Interactive Patient Education  2017 [employer].

## 2020-07-06 DIAGNOSIS — E041 Nontoxic single thyroid nodule: Secondary | ICD-10-CM | POA: Diagnosis not present

## 2020-07-06 DIAGNOSIS — R7989 Other specified abnormal findings of blood chemistry: Secondary | ICD-10-CM | POA: Diagnosis not present

## 2020-07-12 NOTE — Telephone Encounter (Signed)
Patient has been called.

## 2020-07-31 ENCOUNTER — Other Ambulatory Visit: Payer: Self-pay | Admitting: Internal Medicine

## 2020-07-31 DIAGNOSIS — Z1231 Encounter for screening mammogram for malignant neoplasm of breast: Secondary | ICD-10-CM

## 2020-08-01 DIAGNOSIS — H5203 Hypermetropia, bilateral: Secondary | ICD-10-CM | POA: Diagnosis not present

## 2020-08-01 DIAGNOSIS — H2513 Age-related nuclear cataract, bilateral: Secondary | ICD-10-CM | POA: Diagnosis not present

## 2020-08-01 DIAGNOSIS — H35361 Drusen (degenerative) of macula, right eye: Secondary | ICD-10-CM | POA: Diagnosis not present

## 2020-08-01 DIAGNOSIS — H25013 Cortical age-related cataract, bilateral: Secondary | ICD-10-CM | POA: Diagnosis not present

## 2020-08-01 DIAGNOSIS — H43813 Vitreous degeneration, bilateral: Secondary | ICD-10-CM | POA: Diagnosis not present

## 2020-08-02 ENCOUNTER — Telehealth: Payer: Self-pay

## 2020-08-02 NOTE — Telephone Encounter (Signed)
Which one is she due for?

## 2020-08-02 NOTE — Telephone Encounter (Signed)
Yes, Pneumovax

## 2020-08-02 NOTE — Telephone Encounter (Signed)
Spoke with pt and she has been scheduled 

## 2020-08-02 NOTE — Telephone Encounter (Signed)
Pt would like to get pneumonia shot. Is this okay to schedule?

## 2020-08-04 DIAGNOSIS — S83272D Complex tear of lateral meniscus, current injury, left knee, subsequent encounter: Secondary | ICD-10-CM | POA: Diagnosis not present

## 2020-08-04 DIAGNOSIS — M1712 Unilateral primary osteoarthritis, left knee: Secondary | ICD-10-CM | POA: Diagnosis not present

## 2020-08-08 ENCOUNTER — Ambulatory Visit (INDEPENDENT_AMBULATORY_CARE_PROVIDER_SITE_OTHER): Payer: Medicare Other

## 2020-08-08 ENCOUNTER — Other Ambulatory Visit: Payer: Self-pay

## 2020-08-08 DIAGNOSIS — Z23 Encounter for immunization: Secondary | ICD-10-CM | POA: Diagnosis not present

## 2020-08-08 NOTE — Progress Notes (Signed)
Patient presented for pneumovax 23 injection to right deltoid, patient voiced no concerns nor showed any signs of distress during injection. 

## 2020-08-10 DIAGNOSIS — S83272D Complex tear of lateral meniscus, current injury, left knee, subsequent encounter: Secondary | ICD-10-CM | POA: Diagnosis not present

## 2020-08-10 DIAGNOSIS — M1712 Unilateral primary osteoarthritis, left knee: Secondary | ICD-10-CM | POA: Diagnosis not present

## 2020-08-14 ENCOUNTER — Ambulatory Visit
Admission: RE | Admit: 2020-08-14 | Discharge: 2020-08-14 | Disposition: A | Discharge status: Home / Self Care | Payer: Medicare Other | Source: Ambulatory Visit | Attending: Internal Medicine | Admitting: Internal Medicine

## 2020-08-14 ENCOUNTER — Other Ambulatory Visit: Payer: Self-pay

## 2020-08-14 DIAGNOSIS — Z1231 Encounter for screening mammogram for malignant neoplasm of breast: Secondary | ICD-10-CM | POA: Diagnosis not present

## 2020-08-18 DIAGNOSIS — S83272D Complex tear of lateral meniscus, current injury, left knee, subsequent encounter: Secondary | ICD-10-CM | POA: Diagnosis not present

## 2020-08-18 DIAGNOSIS — M1712 Unilateral primary osteoarthritis, left knee: Secondary | ICD-10-CM | POA: Diagnosis not present

## 2020-09-12 DIAGNOSIS — H18413 Arcus senilis, bilateral: Secondary | ICD-10-CM | POA: Diagnosis not present

## 2020-09-12 DIAGNOSIS — H2513 Age-related nuclear cataract, bilateral: Secondary | ICD-10-CM | POA: Diagnosis not present

## 2020-09-12 DIAGNOSIS — H25043 Posterior subcapsular polar age-related cataract, bilateral: Secondary | ICD-10-CM | POA: Diagnosis not present

## 2020-09-12 DIAGNOSIS — H2511 Age-related nuclear cataract, right eye: Secondary | ICD-10-CM | POA: Diagnosis not present

## 2020-09-12 DIAGNOSIS — H25013 Cortical age-related cataract, bilateral: Secondary | ICD-10-CM | POA: Diagnosis not present

## 2020-09-28 DIAGNOSIS — M62830 Muscle spasm of back: Secondary | ICD-10-CM | POA: Diagnosis not present

## 2020-09-28 DIAGNOSIS — M5416 Radiculopathy, lumbar region: Secondary | ICD-10-CM | POA: Diagnosis not present

## 2020-09-28 DIAGNOSIS — M5136 Other intervertebral disc degeneration, lumbar region: Secondary | ICD-10-CM | POA: Diagnosis not present

## 2020-09-28 DIAGNOSIS — M48062 Spinal stenosis, lumbar region with neurogenic claudication: Secondary | ICD-10-CM | POA: Diagnosis not present

## 2020-10-09 ENCOUNTER — Ambulatory Visit (INDEPENDENT_AMBULATORY_CARE_PROVIDER_SITE_OTHER): Payer: Medicare Other

## 2020-10-09 ENCOUNTER — Encounter: Payer: Self-pay | Admitting: Internal Medicine

## 2020-10-09 ENCOUNTER — Other Ambulatory Visit: Payer: Self-pay

## 2020-10-09 ENCOUNTER — Ambulatory Visit (INDEPENDENT_AMBULATORY_CARE_PROVIDER_SITE_OTHER): Payer: Medicare Other | Admitting: Internal Medicine

## 2020-10-09 VITALS — BP 150/82 | HR 78 | Temp 98.1°F | Ht 60.98 in | Wt 128.2 lb

## 2020-10-09 DIAGNOSIS — E559 Vitamin D deficiency, unspecified: Secondary | ICD-10-CM

## 2020-10-09 DIAGNOSIS — R61 Generalized hyperhidrosis: Secondary | ICD-10-CM | POA: Diagnosis not present

## 2020-10-09 DIAGNOSIS — E538 Deficiency of other specified B group vitamins: Secondary | ICD-10-CM | POA: Diagnosis not present

## 2020-10-09 DIAGNOSIS — S0993XA Unspecified injury of face, initial encounter: Secondary | ICD-10-CM

## 2020-10-09 DIAGNOSIS — R4189 Other symptoms and signs involving cognitive functions and awareness: Secondary | ICD-10-CM

## 2020-10-09 DIAGNOSIS — J342 Deviated nasal septum: Secondary | ICD-10-CM | POA: Diagnosis not present

## 2020-10-09 DIAGNOSIS — E7800 Pure hypercholesterolemia, unspecified: Secondary | ICD-10-CM | POA: Diagnosis not present

## 2020-10-09 LAB — LIPID PANEL
Cholesterol: 207 mg/dL — ABNORMAL HIGH (ref 0–200)
HDL: 62.5 mg/dL (ref >39.00)
LDL Cholesterol: 129 mg/dL — ABNORMAL HIGH (ref 0–99)
NonHDL: 144.4
Total CHOL/HDL Ratio: 3
Triglycerides: 75 mg/dL (ref 0.0–149.0)
VLDL: 15 mg/dL (ref 0.0–40.0)

## 2020-10-09 LAB — CBC WITH DIFFERENTIAL/PLATELET
Basophils Absolute: 0.1 10*3/uL (ref 0.0–0.1)
Basophils Relative: 0.5 % (ref 0.0–3.0)
Eosinophils Absolute: 0.1 10*3/uL (ref 0.0–0.7)
Eosinophils Relative: 1.2 % (ref 0.0–5.0)
HCT: 42.1 % (ref 36.0–46.0)
Hemoglobin: 14.2 g/dL (ref 12.0–15.0)
Lymphocytes Relative: 12.9 % (ref 12.0–46.0)
Lymphs Abs: 1.4 10*3/uL (ref 0.7–4.0)
MCHC: 33.6 g/dL (ref 30.0–36.0)
MCV: 88.1 fl (ref 78.0–100.0)
Monocytes Absolute: 0.9 10*3/uL (ref 0.1–1.0)
Monocytes Relative: 8.1 % (ref 3.0–12.0)
Neutro Abs: 8.5 10*3/uL — ABNORMAL HIGH (ref 1.4–7.7)
Neutrophils Relative %: 77.3 % — ABNORMAL HIGH (ref 43.0–77.0)
Platelets: 240 10*3/uL (ref 150.0–400.0)
RBC: 4.78 Mil/uL (ref 3.87–5.11)
RDW: 13.7 % (ref 11.5–15.5)
WBC: 11.1 10*3/uL — ABNORMAL HIGH (ref 4.0–10.5)

## 2020-10-09 LAB — COMPREHENSIVE METABOLIC PANEL
ALT: 14 U/L (ref 0–35)
AST: 19 U/L (ref 0–37)
Albumin: 4.3 g/dL (ref 3.5–5.2)
Alkaline Phosphatase: 99 U/L (ref 39–117)
BUN: 17 mg/dL (ref 6–23)
CO2: 30 mEq/L (ref 19–32)
Calcium: 9.6 mg/dL (ref 8.4–10.5)
Chloride: 101 mEq/L (ref 96–112)
Creatinine, Ser: 0.88 mg/dL (ref 0.40–1.20)
GFR: 63.77 mL/min (ref >60.00)
Glucose, Bld: 112 mg/dL — ABNORMAL HIGH (ref 70–99)
Potassium: 4 mEq/L (ref 3.5–5.1)
Sodium: 139 mEq/L (ref 135–145)
Total Bilirubin: 0.6 mg/dL (ref 0.2–1.2)
Total Protein: 7.2 g/dL (ref 6.0–8.3)

## 2020-10-09 LAB — VITAMIN B12: Vitamin B-12: 152 pg/mL — ABNORMAL LOW (ref 211–911)

## 2020-10-09 LAB — VITAMIN D 25 HYDROXY (VIT D DEFICIENCY, FRACTURES): VITD: 60.83 ng/mL (ref 30.00–100.00)

## 2020-10-09 NOTE — Progress Notes (Signed)
Subjective:  Patient ID: Kimberly Rojas, female    DOB: 10/09/1945  Age: 75 y.o. MRN: [id number]  CC: The primary encounter diagnosis was Pure hypercholesterolemia. Diagnoses of Vitamin D deficiency, Night sweats, Facial trauma, initial encounter, Cognitive changes, and B12 deficiency were also pertinent to this visit.  HPI GRATIA DISLA presents for FOLLOW UP on chronic issues, but coincidentally needs evaluation of head trauma  This visit occurred during the SARS-CoV-2 public health emergency.  Safety protocols were in place, including screening questions prior to the visit, additional usage of staff PPE, and extensive cleaning of exam room while observing appropriate contact time as indicated for disinfecting solutions.    Patient fell yesterday evening while walking up a short flight of cement steps to her neighbor's home.   She states that she turned around to check on her dog while stepping up and missed the step with her foot.  She fell, striking the left side of her face directly on the concrete steps ,  She split her lip, scratched the lens of her eye glass and the frame bruised her  left zygoma and cut the bridge of her nose.  She also scraped her temple .   Did not go to ER due to wait time.  She did not lose consciousness  She Noticed a slight trickle  of blood , apparently from mouth, that stained her pillow  when she lay down on her right side,  She has not had any nosebleeds and did not bite her tongue.  She denies any changes in vision or alertness . No pain with chewing but has not eaten much since then.    2)  She has been Having nocturnal hot flashes at night . Mild  Wakes up clammy , occurs  Less often occurring during the day .  No cough symptoms,  no weight loss    3) hypovitaminosis D:  Taking 2000 Ius Vitamin  D for the past 2 months   4) she has noticed Some forgetfullness , wonders if  b12 and/or prevagen would help      Outpatient Medications Prior to Visit   Medication Sig Dispense Refill  . acetaminophen (TYLENOL) 500 MG tablet Take 500 mg by mouth every 6 (six) hours as needed.    . busPIRone (BUSPAR) 7.5 MG tablet Take 1 tablet (7.5 mg total) by mouth 3 (three) times daily. 90 tablet 11  . cholecalciferol (VITAMIN D3) 25 MCG (1000 UT) tablet Take 1,000 Units by mouth daily.    . Multiple Vitamin (MULTI-VITAMIN) tablet Take 1 tablet by mouth daily.     . pantoprazole (PROTONIX) 20 MG tablet     . Fluticasone Furoate (ARNUITY ELLIPTA) 100 MCG/ACT AEPB Inhale 1 puff into the lungs daily. (Patient not taking: Reported on 10/09/2020) 30 each 3  . Spacer/Aero-Holding Chambers (AEROCHAMBER PLUS) inhaler Use as instructed (Patient not taking: Reported on 10/09/2020) 1 each 2   No facility-administered medications prior to visit.    Review of Systems;  Patient denies headache, fevers, malaise, unintentional weight loss, skin rash, eye pain, sinus congestion and sinus pain, sore throat, dysphagia,  hemoptysis , cough, dyspnea, wheezing, chest pain, palpitations, orthopnea, edema, abdominal pain, nausea, melena, diarrhea, constipation, flank pain, dysuria, hematuria, urinary  Frequency, nocturia, numbness, tingling, seizures,  Focal weakness, Loss of consciousness,  Tremor, insomnia, depression, anxiety, and suicidal ideation.      Objective:  BP (!) 150/82 (BP Location: Left Arm, Patient Position: Sitting)   Pulse 78  Temp 98.1 F (36.7 C)   Ht 5' 0.98" (1.549 m)   Wt 128 lb 3.2 oz (58.2 kg)   SpO2 98%   BMI 24.24 kg/m   BP Readings from Last 3 Encounters:  10/09/20 (!) 150/82  06/02/20 118/69  03/15/20 (!) 144/76    Wt Readings from Last 3 Encounters:  10/09/20 128 lb 3.2 oz (58.2 kg)  06/02/20 127 lb (57.6 kg)  03/15/20 127 lb (57.6 kg)    General appearance: alert, cooperative and appears stated age Head:  Superficial abrasion to left forehead, small gash left eyebrow  And bridge of nose.  Left zygoma swollen, reddish/purple .   Ears: normal TM's and external ear canals both ears. Throat: lips, mucosa, and tongue normal; teeth and gums normal Neck: no adenopathy, no carotid bruit, supple, symmetrical, trachea midline and thyroid not enlarged, symmetric, no tenderness/mass/nodules Back: symmetric, no curvature. ROM normal. No CVA tenderness. Lungs: clear to auscultation bilaterally Heart: regular rate and rhythm, S1, S2 normal, no murmur, click, rub or gallop Abdomen: soft, non-tender; bowel sounds normal; no masses,  no organomegaly Pulses: 2+ and symmetric Skin: Skin color, texture, turgor normal. No rashes or lesions Lymph nodes: Cervical, supraclavicular, and axillary nodes normal. Neuro: CNs 2-12 intact. PERRL.  DTRs 2+/4 in biceps, brachioradialis, patellars and achilles. Muscle strength 5/5 in upper and lower exremities. cerebellar function normal. Romberg negative.  No pronator drift.   Gait normal.   Lab Results  Component Value Date   HGBA1C 5.7 10/24/2015    Lab Results  Component Value Date   CREATININE 0.88 10/09/2020   CREATININE 0.80 02/07/2020   CREATININE 0.81 01/21/2019    Lab Results  Component Value Date   WBC 11.1 (H) 10/09/2020   HGB 14.2 10/09/2020   HCT 42.1 10/09/2020   PLT 240.0 10/09/2020   GLUCOSE 112 (H) 10/09/2020   CHOL 207 (H) 10/09/2020   TRIG 75.0 10/09/2020   HDL 62.50 10/09/2020   LDLDIRECT 141.0 09/18/2017   LDLCALC 129 (H) 10/09/2020   ALT 14 10/09/2020   AST 19 10/09/2020   NA 139 10/09/2020   K 4.0 10/09/2020   CL 101 10/09/2020   CREATININE 0.88 10/09/2020   BUN 17 10/09/2020   CO2 30 10/09/2020   TSH 6.17 (H) 02/07/2020   INR 1.03 03/26/2013   HGBA1C 5.7 10/24/2015    MM 3D SCREEN BREAST BILATERAL  Result Date: 08/16/2020 CLINICAL DATA:  Screening. EXAM: DIGITAL SCREENING BILATERAL MAMMOGRAM WITH TOMO AND CAD COMPARISON:  Previous exam(s). ACR Breast Density Category b: There are scattered areas of fibroglandular density. FINDINGS: There are no  findings suspicious for malignancy. Images were processed with CAD. IMPRESSION: No mammographic evidence of malignancy. A result letter of this screening mammogram will be mailed directly to the patient. RECOMMENDATION: Screening mammogram in one year. (Code:SM-B-01Y) BI-RADS CATEGORY  1: Negative. Electronically Signed   By: Sande Brothers M.D.   On: 08/16/2020 11:37    Assessment & Plan:   Problem List Items Addressed This Visit      Unprioritized   B12 deficiency    Found today during workup for forgetfullness,  Will have her return for injections,  Intrinsic factor ab,        Facial trauma, initial encounter    Neurologic exam is normal after recent fall onto concrete steps .  Facial bones x rayed today and no fractures were  noted.       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  She will follow up with Dr Jayme Cloud as planned.   Will place PPD   Lab Results  Component Value Date   WBC 11.1 (H) 10/09/2020   HGB 14.2 10/09/2020   HCT 42.1 10/09/2020   MCV 88.1 10/09/2020   PLT 240.0 10/09/2020         Relevant Orders   CBC with Differential/Platelet (Completed)   DG Chest 2 View (Completed)   Pure hypercholesterolemia - Primary   Relevant Orders   Comprehensive metabolic panel (Completed)   Lipid panel (Completed)    Other Visit Diagnoses    Vitamin D deficiency       Relevant Orders   VITAMIN D 25 Hydroxy (Vit-D Deficiency, Fractures) (Completed)   Cognitive changes       Relevant Orders   Vitamin B12 (Completed)      I have discontinued Okey Regal A. Postle's AeroChamber Plus and Arnuity Ellipta. I am also having her maintain her cholecalciferol, Multi-Vitamin, acetaminophen, busPIRone, and pantoprazole.  No orders of the defined types were placed in this encounter.   Medications Discontinued During This Encounter  Medication Reason  .  Fluticasone Furoate (ARNUITY ELLIPTA) 100 MCG/ACT AEPB Error  . Spacer/Aero-Holding Chambers (AEROCHAMBER PLUS) inhaler Error    Follow-up: No follow-ups on file.   Sherlene Shams, MD

## 2020-10-09 NOTE — Patient Instructions (Signed)
Continue applying ice for 15 minutes every 2 hours    You can take   up to 2000 mg of acetominophen (tylenol) every day safely  In divided doses (500 mg every 6 hours  Or 1000 mg every 12 hours.)

## 2020-10-10 ENCOUNTER — Ambulatory Visit: Payer: Medicare Other | Admitting: Internal Medicine

## 2020-10-10 DIAGNOSIS — S0993XA Unspecified injury of face, initial encounter: Secondary | ICD-10-CM | POA: Insufficient documentation

## 2020-10-10 DIAGNOSIS — R61 Generalized hyperhidrosis: Secondary | ICD-10-CM | POA: Insufficient documentation

## 2020-10-10 DIAGNOSIS — E538 Deficiency of other specified B group vitamins: Secondary | ICD-10-CM | POA: Insufficient documentation

## 2020-10-10 NOTE — Assessment & Plan Note (Signed)
Found today during workup for forgetfullness,  Will have her return for injections,  Intrinsic factor ab,

## 2020-10-10 NOTE — Progress Notes (Signed)
SEVERAL THINGS TO ADDRESS so please call patient   HER b12 IS LOW  HER CHEST X RAY SHOWS A PERSISTENT PATTER OF PNEUMONITIS (INFLAMMATION) HER FACIAL X RAYS SHOWED  NO  FRACTURES,  JUST A SLIGHT RIGHTWARD DEVIATION OF THE NASAL SEPTUM ,  WHICH MAY BE OLD AND NOT RELEVANT. BUT IF SHE HAS TROUBLE BREATHING THROUGH NOSE SINCE HER FALL,  SHE WILL NEED TO SEE ENT   Needs RN VISIT ON Wednesday FOR 1) B12 INJECTION (FIRST OF 4 WEEKLY) ALONG WITH ,  ADDITIONAL LABS AND  PPD  TB SKIN TEST,  WHICH WILL NEED TO BE READ ON Friday

## 2020-10-10 NOTE — Assessment & Plan Note (Addendum)
Neurologic exam is normal after recent fall onto concrete steps .  Facial bones x rayed today and no fractures were  noted.

## 2020-10-10 NOTE — Progress Notes (Signed)
Your b12 is low and needs to be treated,  because Untreated B12 deficiency can lead to irreversible dementia and neuropathy so it is very important to treat.  please arrange to come in for a b12 injection weekly x 4 , and we will check to see if you need to continue injections vs transitioning to oral supplements by checking an Intrinsic Factor antibody during one of your visits . The office will call you to set up a RN and a lab visit for the injection and the lab.  If you do not hear from [place] by the end of Wednesday, please cal.  Regards,   Duncan Dull, MD

## 2020-10-10 NOTE — Assessment & Plan Note (Addendum)
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  She will follow up with Dr Jayme Cloud as planned.   Will place PPD   Lab Results  Component Value Date   WBC 11.1 (H) 10/09/2020   HGB 14.2 10/09/2020   HCT 42.1 10/09/2020   MCV 88.1 10/09/2020   PLT 240.0 10/09/2020

## 2020-10-11 ENCOUNTER — Other Ambulatory Visit: Payer: Self-pay

## 2020-10-11 ENCOUNTER — Ambulatory Visit (INDEPENDENT_AMBULATORY_CARE_PROVIDER_SITE_OTHER): Payer: Medicare Other

## 2020-10-11 DIAGNOSIS — E538 Deficiency of other specified B group vitamins: Secondary | ICD-10-CM | POA: Diagnosis not present

## 2020-10-11 DIAGNOSIS — Z111 Encounter for screening for respiratory tuberculosis: Secondary | ICD-10-CM

## 2020-10-11 MED ORDER — CYANOCOBALAMIN 1000 MCG/ML IJ SOLN
1000.0000 ug | Freq: Once | INTRAMUSCULAR | Status: AC
  Administered 2020-10-11: 1000 ug via INTRAMUSCULAR

## 2020-10-11 NOTE — Progress Notes (Signed)
Patient presented for B 12 injection to left deltoid and PPD left forearm, patient voiced no concerns nor showed any signs of distress during injection.

## 2020-10-13 ENCOUNTER — Other Ambulatory Visit: Payer: Self-pay

## 2020-10-13 ENCOUNTER — Ambulatory Visit: Payer: Medicare Other | Admitting: *Deleted

## 2020-10-13 DIAGNOSIS — Z111 Encounter for screening for respiratory tuberculosis: Secondary | ICD-10-CM

## 2020-10-13 LAB — TB SKIN TEST
Induration: 0 mm
TB Skin Test: NEGATIVE

## 2020-10-13 NOTE — Progress Notes (Signed)
Patient has question concerning her Chest X-ray received via MyChart her concern is the interstital pneumonitis.

## 2020-10-15 ENCOUNTER — Telehealth: Payer: Self-pay | Admitting: Internal Medicine

## 2020-10-15 NOTE — Telephone Encounter (Signed)
The chest x ray shows the same pattern as was seen on the previous imaging last year.   the CBC and PPD skin test were normal , follow up with Dr Jayme Cloud as planned.

## 2020-10-17 DIAGNOSIS — R140 Abdominal distension (gaseous): Secondary | ICD-10-CM | POA: Diagnosis not present

## 2020-10-17 DIAGNOSIS — K219 Gastro-esophageal reflux disease without esophagitis: Secondary | ICD-10-CM | POA: Diagnosis not present

## 2020-10-17 NOTE — Telephone Encounter (Signed)
Patient notified and voiced understanding.

## 2020-10-18 ENCOUNTER — Ambulatory Visit (INDEPENDENT_AMBULATORY_CARE_PROVIDER_SITE_OTHER): Payer: Medicare Other

## 2020-10-18 ENCOUNTER — Other Ambulatory Visit: Payer: Self-pay

## 2020-10-18 DIAGNOSIS — E538 Deficiency of other specified B group vitamins: Secondary | ICD-10-CM

## 2020-10-18 MED ORDER — CYANOCOBALAMIN 1000 MCG/ML IJ SOLN
1000.0000 ug | Freq: Once | INTRAMUSCULAR | Status: AC
  Administered 2020-10-18: 1000 ug via INTRAMUSCULAR

## 2020-10-18 NOTE — Progress Notes (Signed)
Patient presented for B 12 injection to left deltoid, patient voiced no concerns nor showed any signs of distress during injection. 

## 2020-10-25 ENCOUNTER — Ambulatory Visit (INDEPENDENT_AMBULATORY_CARE_PROVIDER_SITE_OTHER): Payer: Medicare Other

## 2020-10-25 ENCOUNTER — Other Ambulatory Visit: Payer: Self-pay

## 2020-10-25 DIAGNOSIS — E538 Deficiency of other specified B group vitamins: Secondary | ICD-10-CM

## 2020-10-25 MED ORDER — CYANOCOBALAMIN 1000 MCG/ML IJ SOLN
1000.0000 ug | Freq: Once | INTRAMUSCULAR | Status: AC
  Administered 2020-10-25: 1000 ug via INTRAMUSCULAR

## 2020-10-25 NOTE — Progress Notes (Signed)
Patient presented for B 12 injection to left deltoid, patient voiced no concerns nor showed any signs of distress during injection. 

## 2020-10-28 DIAGNOSIS — E041 Nontoxic single thyroid nodule: Secondary | ICD-10-CM | POA: Diagnosis not present

## 2020-10-28 DIAGNOSIS — R7989 Other specified abnormal findings of blood chemistry: Secondary | ICD-10-CM | POA: Diagnosis not present

## 2020-11-01 ENCOUNTER — Other Ambulatory Visit: Payer: Self-pay

## 2020-11-01 ENCOUNTER — Ambulatory Visit (INDEPENDENT_AMBULATORY_CARE_PROVIDER_SITE_OTHER): Payer: Medicare Other

## 2020-11-01 DIAGNOSIS — E538 Deficiency of other specified B group vitamins: Secondary | ICD-10-CM

## 2020-11-01 MED ORDER — CYANOCOBALAMIN 1000 MCG/ML IJ SOLN
1000.0000 ug | Freq: Once | INTRAMUSCULAR | Status: AC
  Administered 2020-11-01: 1000 ug via INTRAMUSCULAR

## 2020-11-01 NOTE — Progress Notes (Signed)
Kimberly Rojas presents today for injection per MD orders. B12 injection  administered IM in left Upper Arm. Administration without incident. Patient tolerated well.  Nina,cma

## 2020-11-06 DIAGNOSIS — H8110 Benign paroxysmal vertigo, unspecified ear: Secondary | ICD-10-CM

## 2020-11-07 MED ORDER — MECLIZINE HCL 12.5 MG PO TABS: 12.5000 mg | ORAL_TABLET | Freq: Three times a day (TID) | ORAL | 1 refills | Status: DC | PRN

## 2020-11-27 DIAGNOSIS — H2513 Age-related nuclear cataract, bilateral: Secondary | ICD-10-CM | POA: Diagnosis not present

## 2020-11-27 DIAGNOSIS — H52201 Unspecified astigmatism, right eye: Secondary | ICD-10-CM | POA: Diagnosis not present

## 2020-11-27 DIAGNOSIS — H2511 Age-related nuclear cataract, right eye: Secondary | ICD-10-CM | POA: Diagnosis not present

## 2020-11-28 DIAGNOSIS — H2512 Age-related nuclear cataract, left eye: Secondary | ICD-10-CM | POA: Diagnosis not present

## 2020-12-04 ENCOUNTER — Ambulatory Visit (INDEPENDENT_AMBULATORY_CARE_PROVIDER_SITE_OTHER): Payer: Medicare Other

## 2020-12-04 ENCOUNTER — Other Ambulatory Visit: Payer: Self-pay

## 2020-12-04 DIAGNOSIS — E538 Deficiency of other specified B group vitamins: Secondary | ICD-10-CM

## 2020-12-04 MED ORDER — CYANOCOBALAMIN 1000 MCG/ML IJ SOLN
1000.0000 ug | Freq: Once | INTRAMUSCULAR | Status: AC
  Administered 2020-12-04: 1000 ug via INTRAMUSCULAR

## 2020-12-04 NOTE — Progress Notes (Signed)
Patient presented for B 12 injection to left deltoid, patient voiced no concerns nor showed any signs of distress during injection. 

## 2020-12-11 DIAGNOSIS — H2512 Age-related nuclear cataract, left eye: Secondary | ICD-10-CM | POA: Diagnosis not present

## 2020-12-11 DIAGNOSIS — H2513 Age-related nuclear cataract, bilateral: Secondary | ICD-10-CM | POA: Diagnosis not present

## 2020-12-26 DIAGNOSIS — M48062 Spinal stenosis, lumbar region with neurogenic claudication: Secondary | ICD-10-CM | POA: Diagnosis not present

## 2020-12-26 DIAGNOSIS — M62830 Muscle spasm of back: Secondary | ICD-10-CM | POA: Diagnosis not present

## 2020-12-26 DIAGNOSIS — M5416 Radiculopathy, lumbar region: Secondary | ICD-10-CM | POA: Diagnosis not present

## 2020-12-26 DIAGNOSIS — M5136 Other intervertebral disc degeneration, lumbar region: Secondary | ICD-10-CM | POA: Diagnosis not present

## 2021-01-02 ENCOUNTER — Other Ambulatory Visit: Payer: Self-pay

## 2021-01-02 ENCOUNTER — Ambulatory Visit
Admission: RE | Admit: 2021-01-02 | Discharge: 2021-01-02 | Disposition: A | Discharge status: Home / Self Care | Payer: Medicare Other | Source: Ambulatory Visit | Attending: Pulmonary Disease | Admitting: Pulmonary Disease

## 2021-01-02 DIAGNOSIS — J849 Interstitial pulmonary disease, unspecified: Secondary | ICD-10-CM | POA: Diagnosis not present

## 2021-01-02 DIAGNOSIS — I700 Atherosclerosis of aorta: Secondary | ICD-10-CM | POA: Diagnosis not present

## 2021-01-02 DIAGNOSIS — I2510 Atherosclerotic heart disease of native coronary artery without angina pectoris: Secondary | ICD-10-CM | POA: Diagnosis not present

## 2021-01-02 DIAGNOSIS — R911 Solitary pulmonary nodule: Secondary | ICD-10-CM | POA: Diagnosis not present

## 2021-01-02 DIAGNOSIS — J479 Bronchiectasis, uncomplicated: Secondary | ICD-10-CM | POA: Diagnosis not present

## 2021-01-04 ENCOUNTER — Ambulatory Visit (INDEPENDENT_AMBULATORY_CARE_PROVIDER_SITE_OTHER): Payer: Medicare Other

## 2021-01-04 ENCOUNTER — Other Ambulatory Visit: Payer: Self-pay

## 2021-01-04 DIAGNOSIS — E538 Deficiency of other specified B group vitamins: Secondary | ICD-10-CM

## 2021-01-04 MED ORDER — CYANOCOBALAMIN 1000 MCG/ML IJ SOLN
1000.0000 ug | Freq: Once | INTRAMUSCULAR | Status: AC
  Administered 2021-01-04: 1000 ug via INTRAMUSCULAR

## 2021-01-04 NOTE — Progress Notes (Signed)
Patient presented for B 12 injection to left deltoid, patient voiced no concerns nor showed any signs of distress during injection. 

## 2021-01-22 ENCOUNTER — Other Ambulatory Visit: Payer: Self-pay

## 2021-01-24 ENCOUNTER — Other Ambulatory Visit (INDEPENDENT_AMBULATORY_CARE_PROVIDER_SITE_OTHER): Payer: Medicare Other

## 2021-01-24 ENCOUNTER — Other Ambulatory Visit: Payer: Self-pay

## 2021-01-24 DIAGNOSIS — E538 Deficiency of other specified B group vitamins: Secondary | ICD-10-CM

## 2021-01-24 DIAGNOSIS — E039 Hypothyroidism, unspecified: Secondary | ICD-10-CM | POA: Diagnosis not present

## 2021-01-25 ENCOUNTER — Telehealth: Payer: Self-pay | Admitting: Internal Medicine

## 2021-01-25 DIAGNOSIS — E038 Other specified hypothyroidism: Secondary | ICD-10-CM

## 2021-01-25 DIAGNOSIS — E039 Hypothyroidism, unspecified: Secondary | ICD-10-CM

## 2021-01-25 NOTE — Telephone Encounter (Signed)
Patient called and is requesting to have her B12 levels checked.

## 2021-01-26 NOTE — Telephone Encounter (Signed)
Called patient and she was actually wanting her lab results from the intrinsic factor antibody but the lab has not resulted yet.

## 2021-01-27 LAB — FOLATE RBC: RBC Folate: 746 ng/mL RBC (ref >280)

## 2021-01-27 LAB — TSH: TSH: 4.25 mIU/L (ref 0.40–4.50)

## 2021-01-27 LAB — INTRINSIC FACTOR ANTIBODIES: Intrinsic Factor: NEGATIVE

## 2021-01-30 NOTE — Telephone Encounter (Signed)
Pt would like to know if she needs to continue with B12 injections or start taking the OTC supplement.

## 2021-01-31 DIAGNOSIS — M7981 Nontraumatic hematoma of soft tissue: Secondary | ICD-10-CM | POA: Diagnosis not present

## 2021-02-05 ENCOUNTER — Ambulatory Visit: Payer: Medicare Other

## 2021-02-09 DIAGNOSIS — S83272D Complex tear of lateral meniscus, current injury, left knee, subsequent encounter: Secondary | ICD-10-CM | POA: Diagnosis not present

## 2021-02-09 DIAGNOSIS — M1712 Unilateral primary osteoarthritis, left knee: Secondary | ICD-10-CM | POA: Diagnosis not present

## 2021-02-21 DIAGNOSIS — J849 Interstitial pulmonary disease, unspecified: Secondary | ICD-10-CM | POA: Diagnosis not present

## 2021-02-21 DIAGNOSIS — B9689 Other specified bacterial agents as the cause of diseases classified elsewhere: Secondary | ICD-10-CM | POA: Diagnosis not present

## 2021-02-21 DIAGNOSIS — J209 Acute bronchitis, unspecified: Secondary | ICD-10-CM | POA: Diagnosis not present

## 2021-02-21 DIAGNOSIS — J019 Acute sinusitis, unspecified: Secondary | ICD-10-CM | POA: Diagnosis not present

## 2021-02-24 DIAGNOSIS — R059 Cough, unspecified: Secondary | ICD-10-CM | POA: Diagnosis not present

## 2021-02-24 DIAGNOSIS — R509 Fever, unspecified: Secondary | ICD-10-CM | POA: Diagnosis not present

## 2021-02-24 DIAGNOSIS — J209 Acute bronchitis, unspecified: Secondary | ICD-10-CM | POA: Diagnosis not present

## 2021-02-24 DIAGNOSIS — B9689 Other specified bacterial agents as the cause of diseases classified elsewhere: Secondary | ICD-10-CM | POA: Diagnosis not present

## 2021-02-24 DIAGNOSIS — Z20822 Contact with and (suspected) exposure to covid-19: Secondary | ICD-10-CM | POA: Diagnosis not present

## 2021-02-24 DIAGNOSIS — J019 Acute sinusitis, unspecified: Secondary | ICD-10-CM | POA: Diagnosis not present

## 2021-03-14 DIAGNOSIS — J301 Allergic rhinitis due to pollen: Secondary | ICD-10-CM | POA: Diagnosis not present

## 2021-03-14 DIAGNOSIS — J342 Deviated nasal septum: Secondary | ICD-10-CM | POA: Diagnosis not present

## 2021-03-14 DIAGNOSIS — J019 Acute sinusitis, unspecified: Secondary | ICD-10-CM | POA: Diagnosis not present

## 2021-03-23 DIAGNOSIS — H16223 Keratoconjunctivitis sicca, not specified as Sjogren's, bilateral: Secondary | ICD-10-CM | POA: Diagnosis not present

## 2021-03-23 DIAGNOSIS — H02885 Meibomian gland dysfunction left lower eyelid: Secondary | ICD-10-CM | POA: Diagnosis not present

## 2021-03-23 DIAGNOSIS — H02882 Meibomian gland dysfunction right lower eyelid: Secondary | ICD-10-CM | POA: Diagnosis not present

## 2021-04-02 DIAGNOSIS — M7981 Nontraumatic hematoma of soft tissue: Secondary | ICD-10-CM | POA: Diagnosis not present

## 2021-04-09 DIAGNOSIS — M1712 Unilateral primary osteoarthritis, left knee: Secondary | ICD-10-CM | POA: Diagnosis not present

## 2021-04-13 DIAGNOSIS — M5416 Radiculopathy, lumbar region: Secondary | ICD-10-CM | POA: Diagnosis not present

## 2021-04-13 DIAGNOSIS — M62830 Muscle spasm of back: Secondary | ICD-10-CM | POA: Diagnosis not present

## 2021-04-13 DIAGNOSIS — M5136 Other intervertebral disc degeneration, lumbar region: Secondary | ICD-10-CM | POA: Diagnosis not present

## 2021-04-13 DIAGNOSIS — M48062 Spinal stenosis, lumbar region with neurogenic claudication: Secondary | ICD-10-CM | POA: Diagnosis not present

## 2021-04-16 DIAGNOSIS — M1712 Unilateral primary osteoarthritis, left knee: Secondary | ICD-10-CM | POA: Diagnosis not present

## 2021-04-23 DIAGNOSIS — M1712 Unilateral primary osteoarthritis, left knee: Secondary | ICD-10-CM | POA: Diagnosis not present

## 2021-04-23 DIAGNOSIS — S83272D Complex tear of lateral meniscus, current injury, left knee, subsequent encounter: Secondary | ICD-10-CM | POA: Diagnosis not present

## 2021-04-30 DIAGNOSIS — E042 Nontoxic multinodular goiter: Secondary | ICD-10-CM | POA: Diagnosis not present

## 2021-04-30 DIAGNOSIS — E038 Other specified hypothyroidism: Secondary | ICD-10-CM | POA: Diagnosis not present

## 2021-05-17 ENCOUNTER — Ambulatory Visit (INDEPENDENT_AMBULATORY_CARE_PROVIDER_SITE_OTHER): Payer: Medicare Other

## 2021-05-17 ENCOUNTER — Other Ambulatory Visit: Payer: Self-pay

## 2021-05-17 DIAGNOSIS — Z23 Encounter for immunization: Secondary | ICD-10-CM

## 2021-06-05 ENCOUNTER — Ambulatory Visit (INDEPENDENT_AMBULATORY_CARE_PROVIDER_SITE_OTHER): Payer: Medicare Other

## 2021-06-05 VITALS — Ht 60.98 in | Wt 128.0 lb

## 2021-06-05 DIAGNOSIS — Z0000 Encounter for general adult medical examination without abnormal findings: Secondary | ICD-10-CM | POA: Diagnosis not present

## 2021-06-05 DIAGNOSIS — Z1231 Encounter for screening mammogram for malignant neoplasm of breast: Secondary | ICD-10-CM

## 2021-06-05 NOTE — Progress Notes (Addendum)
Subjective:   Kimberly Rojas is a 75 y.o. female who presents for Medicare Annual (Subsequent) preventive examination.  Review of Systems    No ROS.  Medicare Wellness Virtual Visit.  Visual/audio telehealth visit, UTA vital signs.   See social history for additional risk factors.   Cardiac Risk Factors include: advanced age (>7men, >69 women)     Objective:    Today's Vitals   06/05/21 1448  Weight: 128 lb (58.1 kg)  Height: 5' 0.98" (1.549 m)   Body mass index is 24.2 kg/m.  Advanced Directives 06/05/2021 05/18/2019 05/11/2019 03/15/2019 09/12/2017 09/24/2016 03/31/2013  Does Patient Have a Medical Advance Directive? No No No No No No Patient does not have advance directive  Would patient like information on creating a medical advance directive? No - Patient declined No - Patient declined - Yes (MAU/Ambulatory/Procedural Areas - Information given) No - Patient declined Yes (MAU/Ambulatory/Procedural Areas - Information given) -  Pre-existing out of facility DNR order (yellow form or pink MOST form) - - - - - - No    Current Medications (verified) Outpatient Encounter Medications as of 06/05/2021  Medication Sig   acetaminophen (TYLENOL) 500 MG tablet Take 500 mg by mouth every 6 (six) hours as needed.   busPIRone (BUSPAR) 7.5 MG tablet Take 1 tablet (7.5 mg total) by mouth 3 (three) times daily. (Patient not taking: Reported on 06/05/2021)   cholecalciferol (VITAMIN D3) 25 MCG (1000 UT) tablet Take 1,000 Units by mouth daily.   meclizine (ANTIVERT) 12.5 MG tablet Take 1 tablet (12.5 mg total) by mouth 3 (three) times daily as needed for dizziness. Can cause drowsiness.   Multiple Vitamin (MULTI-VITAMIN) tablet Take 1 tablet by mouth daily.    pantoprazole (PROTONIX) 20 MG tablet    No facility-administered encounter medications on file as of 06/05/2021.    Allergies (verified) Tramadol, Ibuprofen, and Pantoprazole   History: Past Medical History:  Diagnosis Date   Anxiety     Arthritis    Chicken pox    Dyspnea    denies   Dysrhythmia    tachy at times   Fatigue    GERD (gastroesophageal reflux disease)    Hair loss    Heart murmur    hx of    Heartburn    Hyperlipidemia    Migraines    had many years ago, not currently   Peptic ulcer    Thyroid nodule    Past Surgical History:  Procedure Laterality Date   BREAST EXCISIONAL BIOPSY Left 2005-2010   benign   BREAST SURGERY     left breast biopsy    CESAREAN SECTION     CHOLECYSTECTOMY     DILATION AND CURETTAGE OF UTERUS     KNEE ARTHROSCOPY WITH LATERAL MENISECTOMY Left 05/18/2019   Procedure: KNEE ARTHROSCOPY WITH DEBRIDEMENT AND PARTIAL  LATERAL MENISECTOMY;  Surgeon: Christena Flake, MD;  Location: [facility] ORS;  Service: Orthopedics;  Laterality: Left;   SHOULDER OPEN ROTATOR CUFF REPAIR Right 03/31/2013   Procedure: RIGHT ROTATOR CUFF REPAIR SHOULDER OPEN;  Surgeon: Jacki Cones, MD;  Location: [facility] OR[facility];  Service: Orthopedics;  Laterality: Right;  With [employer]   TONSILLECTOMY AND ADENOIDECTOMY     Family History  Problem Relation Age of Onset   Breast cancer Mother 8   Arthritis Mother    Chronic fatigue Mother    Diabetes Father    Prostate cancer Father    Diabetes Paternal Grandfather    Social History  Socioeconomic History   Marital status: Widowed    Spouse name: Not on file   Number of children: Not on file   Years of education: Not on file   Highest education level: Not on file  Occupational History   Occupation: [email or web address]    Comment: retired  Tobacco Use   Smoking status: Never   Smokeless tobacco: Never  Vaping Use   Vaping Use: Never used  Substance and Sexual Activity   Alcohol use: Yes    Alcohol/week: 1.0 standard drink    Types: 1 Glasses of wine per week   Drug use: No   Sexual activity: Not Currently  Other Topics Concern   Not on file  Social History Narrative   Widower as of recent    Retired   [employer] education   Caffeine- 3 cups tea, no soda  or coffee    Exercise- 3 times, walking   2 children- 1 passed    Social Determinants of Corporate investment banker Strain: Low Risk    Difficulty of Paying Living Expenses: Not hard at all  Food Insecurity: No Food Insecurity   Worried About Programme researcher, broadcasting/film/video in the Last Year: Never true   Ran Out of Food in the Last Year: Never true  Transportation Needs: No Transportation Needs   Lack of Transportation (Medical): No   Lack of Transportation (Non-Medical): No  Physical Activity: Insufficiently Active   Days of Exercise per Week: 3 days   Minutes of Exercise per Session: 30 min  Stress: No Stress Concern Present   Feeling of Stress : Only a little  Social Connections: Unknown   Frequency of Communication with Friends and Family: More than three times a week   Frequency of Social Gatherings with Friends and Family: Not on file   Attends Religious Services: Not on file   Active Member of Clubs or Organizations: Not on file   Attends Banker Meetings: Not on file   Marital Status: Widowed    Tobacco Counseling Counseling given: Not Answered   Clinical Intake:  Pre-visit preparation completed: Yes        Diabetes: No  How often do you need to have someone help you when you read instructions, pamphlets, or other written materials from your doctor or pharmacy?: 1 - Never    Interpreter Needed?: No      Activities of Daily Living In your present state of health, do you have any difficulty performing the following activities: 06/05/2021  Hearing? Y  Comment Difficulty hearing some conversational tones. Followed by [facility], Dr. Andee Poles  Vision? N  Difficulty concentrating or making decisions? N  Walking or climbing stairs? Y  Comment Chronic knee pain. Injections received. Followed by Dr. Joice Lofts.  Dressing or bathing? N  Doing errands, shopping? N  Preparing Food and eating ? N  Using the Toilet? N  In the past six months, have you accidently  leaked urine? Y  Comment Managed with daily liner.  Do you have problems with loss of bowel control? N  Managing your Medications? N  Managing your Finances? N  Housekeeping or managing your Housekeeping? N  Some recent data might be hidden    Patient Care Team: Sherlene Shams, MD as PCP - General (Internal Medicine)  Indicate any recent Medical Services you may have received from other than [facility] providers in the past year (date may be approximate).     Assessment:   This is a routine wellness  examination for [facility].  I connected with Kimberly Rojas today by telephone and verified that I am speaking with the correct person using two identifiers. Location patient: home Location provider: work Persons participating in the virtual visit: patient, Engineer, civil (consulting).    I discussed the limitations, risks, security and privacy concerns of performing an evaluation and management service by telephone and the availability of in person appointments. The patient expressed understanding and verbally consented to this telephonic visit.    Interactive audio and video telecommunications were attempted between this provider and patient, however failed, due to patient having technical difficulties OR patient did not have access to video capability.  We continued and completed visit with audio only.  Some vital signs may be absent or patient reported.   Hearing/Vision screen Hearing Screening - Comments:: Hearing aid not currently worn. Difficulty hearing some conversational tones.  Followed by [facility], Dr. Andee Poles.  Vision Screening - Comments:: Followed by Dr. Excell Seltzer [facility]).  Wears corrective lenses.   Dietary issues and exercise activities discussed: Current Exercise Habits: Home exercise routine, Intensity: Mild Regular diet   Goals Addressed             This Visit's Progress    Increase physical activity       Pace self with activity Walk more for exercise        Depression Screen PHQ  2/9 Scores 06/05/2021 10/09/2020 06/02/2020 03/15/2019 01/13/2019 05/29/2018 05/21/2017  PHQ - 2 Score 0 0 0 0 0 0 0  PHQ- 9 Score - - - - 1 1 3     Fall Risk Fall Risk  06/05/2021 10/09/2020 06/02/2020 07/23/2019 03/15/2019  Falls in the past year? - 1 0 1 0  Number falls in past yr: - 0 0 0 -  Injury with Fall? - 1 - 0 -  Follow up Falls evaluation completed Falls evaluation completed Falls evaluation completed Falls evaluation completed -    FALL RISK PREVENTION PERTAINING TO THE HOME: Adequate lighting in your home to reduce risk of falls? Yes   ASSISTIVE DEVICES UTILIZED TO PREVENT FALLS: Use of a cane, walker or w/c? No   TIMED UP AND GO: Was the test performed? No .   Cognitive Function:    6CIT Screen 06/05/2021 03/15/2019 09/24/2016  What Year? 0 points 0 points 0 points  What month? 0 points 0 points 0 points  What time? 0 points 0 points 0 points  Count back from 20 0 points 0 points 0 points  Months in reverse 0 points 0 points 0 points  Repeat phrase 0 points 0 points 0 points  Total Score 0 0 0    Immunizations Immunization History  Administered Date(s) Administered   Influenza,inj,Quad PF,6+ Mos 06/11/2019, 05/17/2021   Influenza-Unspecified 07/18/2015, 06/11/2019   PFIZER Comirnaty(Gray Top)Covid-19 Tri-Sucrose Vaccine 08/29/2020   PFIZER(Purple Top)SARS-COV-2 Vaccination 11/12/2019, 11/30/2019   PPD Test 10/11/2020   Pneumococcal Conjugate-13 11/26/2017   Pneumococcal Polysaccharide-23 08/08/2020   Tdap 10/31/2013   Shingrix vaccine- Due, Education has been provided regarding the importance of this vaccine. Advised may receive this vaccine at local pharmacy or Health Dept. Aware to provide a copy of the vaccination record if obtained from local pharmacy or Health Dept. Verbalized acceptance and understanding. Deferred.   Health Maintenance Health Maintenance  Topic Date Due   COVID-19 Vaccine (4 - Booster for Pfizer series) 06/21/2021 (Originally 12/28/2020)   Zoster  Vaccines- Shingrix (1 of 2) 09/05/2021 (Originally 03/16/1994)   MAMMOGRAM  08/14/2021   TETANUS/TDAP  11/01/2023  INFLUENZA VACCINE  Completed   DEXA SCAN  Completed   Hepatitis C Screening  Completed   HPV VACCINES  Aged Out   Mammogram- ordered per consent.  CT Chest completed 01/02/21.   Vision Screening: Recommended annual ophthalmology exams for early detection of glaucoma and other disorders of the eye.  Dental Screening: Recommended annual dental exams for proper oral hygiene.  Community Resource Referral / Chronic Care Management: CRR required this visit?  No   CCM required this visit?  No      Plan:   Keep all routine maintenance appointments.   I have personally reviewed and noted the following in the patient's chart:   Medical and social history Use of alcohol, tobacco or illicit drugs  Current medications and supplements including opioid prescriptions. Not taking opioid.  Functional ability and status Nutritional status Physical activity Advanced directives List of other physicians Hospitalizations, surgeries, and ER visits in previous 12 months Vitals Screenings to include cognitive, depression, and falls Referrals and appointments  In addition, I have reviewed and discussed with patient certain preventive protocols, quality metrics, and best practice recommendations. A written personalized care plan for preventive services as well as general preventive health recommendations were provided to patient via mychart.     OBrien-Blaney, Tieler Cournoyer L, LPN   28/12/1322    I have reviewed the above information and agree with above.   Duncan Dull, MD

## 2021-06-05 NOTE — Patient Instructions (Addendum)
Ms. Kimberly Rojas , Thank you for taking time to come for your Medicare Wellness Visit. I appreciate your ongoing commitment to your health goals. Please review the following plan we discussed and let me know if I can assist you in the future.   These are the goals we discussed:  Goals       Patient Stated     Follow up with Primary Care Provider (pt-stated)      As needed       Other     Increase physical activity      Pace self with activity Walk more for exercise         This is a list of the screening recommended for you and due dates:  Health Maintenance  Topic Date Due   COVID-19 Vaccine (4 - Booster for Pfizer series) 06/21/2021*   Zoster (Shingles) Vaccine (1 of 2) 09/05/2021*   Mammogram  08/14/2021   Tetanus Vaccine  11/01/2023   Flu Shot  Completed   DEXA scan (bone density measurement)  Completed   Hepatitis C Screening: USPSTF Recommendation to screen - Ages 77-79 yo.  Completed   HPV Vaccine  Aged Out  *Topic was postponed. The date shown is not the original due date.    Advanced directives: not yet completed  Follow up in one year for your annual wellness visit    Preventive Care 65 Years and Older, Female Preventive care refers to lifestyle choices and visits with your health care provider that can promote health and wellness. What does preventive care include? A yearly physical exam. This is also called an annual well check. Dental exams once or twice a year. Routine eye exams. Ask your health care provider how often you should have your eyes checked. Personal lifestyle choices, including: Daily care of your teeth and gums. Regular physical activity. Eating a healthy diet. Avoiding tobacco and drug use. Limiting alcohol use. Practicing safe sex. Taking low-dose aspirin every day. Taking vitamin and mineral supplements as recommended by your health care provider. What happens during an annual well check? The services and screenings done by your  health care provider during your annual well check will depend on your age, overall health, lifestyle risk factors, and family history of disease. Counseling  Your health care provider may ask you questions about your: Alcohol use. Tobacco use. Drug use. Emotional well-being. Home and relationship well-being. Sexual activity. Eating habits. History of falls. Memory and ability to understand (cognition). Work and work Astronomer. Reproductive health. Screening  You may have the following tests or measurements: Height, weight, and BMI. Blood pressure. Lipid and cholesterol levels. These may be checked every 5 years, or more frequently if you are over 44 years old. Skin check. Lung cancer screening. You may have this screening every year starting at age 48 if you have a 30-pack-year history of smoking and currently smoke or have quit within the past 15 years. Fecal occult blood test (FOBT) of the stool. You may have this test every year starting at age 37. Flexible sigmoidoscopy or colonoscopy. You may have a sigmoidoscopy every 5 years or a colonoscopy every 10 years starting at age 23. Hepatitis C blood test. Hepatitis B blood test. Sexually transmitted disease (STD) testing. Diabetes screening. This is done by checking your blood sugar (glucose) after you have not eaten for a while (fasting). You may have this done every 1-3 years. Bone density scan. This is done to screen for osteoporosis. You may have this done starting  at age 24. Mammogram. This may be done every 1-2 years. Talk to your health care provider about how often you should have regular mammograms. Talk with your health care provider about your test results, treatment options, and if necessary, the need for more tests. Vaccines  Your health care provider may recommend certain vaccines, such as: Influenza vaccine. This is recommended every year. Tetanus, diphtheria, and acellular pertussis (Tdap, Td) vaccine. You may  need a Td booster every 10 years. Zoster vaccine. You may need this after age 29. Pneumococcal 13-valent conjugate (PCV13) vaccine. One dose is recommended after age 62. Pneumococcal polysaccharide (PPSV23) vaccine. One dose is recommended after age 30. Talk to your health care provider about which screenings and vaccines you need and how often you need them. This information is not intended to replace advice given to you by your health care provider. Make sure you discuss any questions you have with your health care provider. Document Released: 09/15/2015 Document Revised: 05/08/2016 Document Reviewed: 06/20/2015 Elsevier Interactive Patient Education  2017 [employer].  Fall Prevention in the Home Falls can cause injuries. They can happen to people of all ages. There are many things you can do to make your home safe and to help prevent falls. What can I do on the outside of my home? Regularly fix the edges of walkways and driveways and fix any cracks. Remove anything that might make you trip as you walk through a door, such as a raised step or threshold. Trim any bushes or trees on the path to your home. Use bright outdoor lighting. Clear any walking paths of anything that might make someone trip, such as rocks or tools. Regularly check to see if handrails are loose or broken. Make sure that both sides of any steps have handrails. Any raised decks and porches should have guardrails on the edges. Have any leaves, snow, or ice cleared regularly. Use sand or salt on walking paths during winter. Clean up any spills in your garage right away. This includes oil or grease spills. What can I do in the bathroom? Use night lights. Install grab bars by the toilet and in the tub and shower. Do not use towel bars as grab bars. Use non-skid mats or decals in the tub or shower. If you need to sit down in the shower, use a plastic, non-slip stool. Keep the floor dry. Clean up any water that spills on  the floor as soon as it happens. Remove soap buildup in the tub or shower regularly. Attach bath mats securely with double-sided non-slip rug tape. Do not have throw rugs and other things on the floor that can make you trip. What can I do in the bedroom? Use night lights. Make sure that you have a light by your bed that is easy to reach. Do not use any sheets or blankets that are too big for your bed. They should not hang down onto the floor. Have a firm chair that has side arms. You can use this for support while you get dressed. Do not have throw rugs and other things on the floor that can make you trip. What can I do in the kitchen? Clean up any spills right away. Avoid walking on wet floors. Keep items that you use a lot in easy-to-reach places. If you need to reach something above you, use a strong step stool that has a grab bar. Keep electrical cords out of the way. Do not use floor polish or wax that makes  floors slippery. If you must use wax, use non-skid floor wax. Do not have throw rugs and other things on the floor that can make you trip. What can I do with my stairs? Do not leave any items on the stairs. Make sure that there are handrails on both sides of the stairs and use them. Fix handrails that are broken or loose. Make sure that handrails are as long as the stairways. Check any carpeting to make sure that it is firmly attached to the stairs. Fix any carpet that is loose or worn. Avoid having throw rugs at the top or bottom of the stairs. If you do have throw rugs, attach them to the floor with carpet tape. Make sure that you have a light switch at the top of the stairs and the bottom of the stairs. If you do not have them, ask someone to add them for you. What else can I do to help prevent falls? Wear shoes that: Do not have high heels. Have rubber bottoms. Are comfortable and fit you well. Are closed at the toe. Do not wear sandals. If you use a stepladder: Make sure  that it is fully opened. Do not climb a closed stepladder. Make sure that both sides of the stepladder are locked into place. Ask someone to hold it for you, if possible. Clearly mark and make sure that you can see: Any grab bars or handrails. First and last steps. Where the edge of each step is. Use tools that help you move around (mobility aids) if they are needed. These include: Canes. Walkers. Scooters. Crutches. Turn on the lights when you go into a dark area. Replace any light bulbs as soon as they burn out. Set up your furniture so you have a clear path. Avoid moving your furniture around. If any of your floors are uneven, fix them. If there are any pets around you, be aware of where they are. Review your medicines with your doctor. Some medicines can make you feel dizzy. This can increase your chance of falling. Ask your doctor what other things that you can do to help prevent falls. This information is not intended to replace advice given to you by your health care provider. Make sure you discuss any questions you have with your health care provider. Document Released: 06/15/2009 Document Revised: 01/25/2016 Document Reviewed: 09/23/2014 Elsevier Interactive Patient Education  2017 [employer]. .

## 2021-06-15 DIAGNOSIS — M5416 Radiculopathy, lumbar region: Secondary | ICD-10-CM | POA: Diagnosis not present

## 2021-06-15 DIAGNOSIS — M48062 Spinal stenosis, lumbar region with neurogenic claudication: Secondary | ICD-10-CM | POA: Diagnosis not present

## 2021-06-20 DIAGNOSIS — M1712 Unilateral primary osteoarthritis, left knee: Secondary | ICD-10-CM | POA: Diagnosis not present

## 2021-06-26 ENCOUNTER — Ambulatory Visit (INDEPENDENT_AMBULATORY_CARE_PROVIDER_SITE_OTHER): Payer: Medicare Other | Admitting: Internal Medicine

## 2021-06-26 ENCOUNTER — Encounter: Payer: Self-pay | Admitting: Internal Medicine

## 2021-06-26 ENCOUNTER — Other Ambulatory Visit: Payer: Self-pay

## 2021-06-26 VITALS — BP 158/76 | HR 57 | Temp 95.5°F | Ht 60.0 in | Wt 127.8 lb

## 2021-06-26 DIAGNOSIS — E039 Hypothyroidism, unspecified: Secondary | ICD-10-CM

## 2021-06-26 DIAGNOSIS — F5105 Insomnia due to other mental disorder: Secondary | ICD-10-CM | POA: Diagnosis not present

## 2021-06-26 DIAGNOSIS — F409 Phobic anxiety disorder, unspecified: Secondary | ICD-10-CM

## 2021-06-26 DIAGNOSIS — E7800 Pure hypercholesterolemia, unspecified: Secondary | ICD-10-CM | POA: Diagnosis not present

## 2021-06-26 DIAGNOSIS — F411 Generalized anxiety disorder: Secondary | ICD-10-CM

## 2021-06-26 DIAGNOSIS — J849 Interstitial pulmonary disease, unspecified: Secondary | ICD-10-CM

## 2021-06-26 DIAGNOSIS — Z1231 Encounter for screening mammogram for malignant neoplasm of breast: Secondary | ICD-10-CM

## 2021-06-26 DIAGNOSIS — E038 Other specified hypothyroidism: Secondary | ICD-10-CM | POA: Diagnosis not present

## 2021-06-26 DIAGNOSIS — M85859 Other specified disorders of bone density and structure, unspecified thigh: Secondary | ICD-10-CM | POA: Diagnosis not present

## 2021-06-26 DIAGNOSIS — Z0000 Encounter for general adult medical examination without abnormal findings: Secondary | ICD-10-CM | POA: Diagnosis not present

## 2021-06-26 DIAGNOSIS — R030 Elevated blood-pressure reading, without diagnosis of hypertension: Secondary | ICD-10-CM | POA: Diagnosis not present

## 2021-06-26 DIAGNOSIS — Z780 Asymptomatic menopausal state: Secondary | ICD-10-CM

## 2021-06-26 MED ORDER — SERTRALINE HCL 50 MG PO TABS: 50.0000 mg | ORAL_TABLET | Freq: Every day | ORAL | 3 refills | Status: DC

## 2021-06-26 NOTE — Patient Instructions (Signed)
  Your  DEXA scan  and your mammogram have been ordered.  Please call to schedule your own  appointment at [facility]  , and their phone number is [phone]   Please start the  Sertraline (generic for Zoloft) at 1/2 tablet daily in the morning with breakfast for the first week to avoid nausea.  You can increase to a full tablet after 1 week if you havenot developed side effects of nausea.    You should start to eel a difference after two weeks on the full dose

## 2021-06-26 NOTE — Assessment & Plan Note (Signed)
She is asymptomatic currently and has an annual CT and follow up with Dr Jayme Cloud

## 2021-06-26 NOTE — Progress Notes (Signed)
Patient ID: Kimberly Rojas, female    DOB: 06-10-1946  Age: 75 y.o. MRN: [id number]  The patient is here for annual prevent ive examination and management of other chronic and acute problems.  This visit occurred during the SARS-CoV-2 public health emergency.  Safety protocols were in place, including screening questions prior to the visit, additional usage of staff PPE, and extensive cleaning of exam room while observing appropriate contact time as indicated for disinfecting solutions.     The risk factors are reflected in the social history.  The roster of all physicians providing medical care to patient - is listed in the Snapshot section of the chart.  Activities of daily living:  The patient is 100% independent in all ADLs: dressing, toileting, feeding as well as independent mobility  Home safety : The patient has smoke detectors in the home. They wear seatbelts.  There are no firearms at home. There is no violence in the home.   There is no risks for hepatitis, STDs or HIV. There is no   history of blood transfusion. They have no travel history to infectious disease endemic areas of the world.  The patient has seen their dentist in the last six month. They have seen their eye doctor in the last year. They admit to slight hearing difficulty with regard to whispered voices and some television programs.  They have deferred audiologic testing in the last year.  They do not  have excessive sun exposure. Discussed the need for sun protection: hats, long sleeves and use of sunscreen if there is significant sun exposure.   Diet: the importance of a healthy diet is discussed. They do have a healthy diet.  The benefits of regular aerobic exercise were discussed. She walks 4 times per week ,  20 minutes.   Depression screen: there are no signs or vegative symptoms of depression- irritability, change in appetite, anhedonia, sadness/tearfullness.  Cognitive assessment: the patient manages all their  financial and personal affairs and is actively engaged. They could relate day,date,year and events; recalled 2/3 objects at 3 minutes; performed clock-face test normally.  The following portions of the patient's history were reviewed and updated as appropriate: allergies, current medications, past family history, past medical history,  past surgical history, past social history  and problem list.  Visual acuity was not assessed per patient preference since she has regular follow up with her ophthalmologist. Hearing and body mass index were assessed and reviewed.   During the course of the visit the patient was educated and counseled about appropriate screening and preventive services including : fall prevention , diabetes screening, nutrition counseling, colorectal cancer screening, and recommended immunizations.    CC: The primary encounter diagnosis was Encounter for screening mammogram for malignant neoplasm of breast. 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   1) chronic anxiety with insomnia: using buspirone for insomnia ,  no during the day makes her too tired .  " I worry about everything." Mostly things regarding the house,  worries about his unmarried son,  3 never married. Her sister's husband died,  sister wants her to move back to [place] to live with her.   2) white coat hypertension :  home readings on a certified machine are 127/70 or lower  3) ILD:  seeing Jayme Cloud annually with CT scan   4) aortic atherosclerosis:    Discussed need for statin therapy given documented evidence of moderate  atherosclerosis in the aorta noted on recent  CT of abdomen and   pelvis and the prognostic implications of this finding.   She is apprehensive about starting statin therapy and wants to have lipid panel done prior to deciding.    History Kimberly Rojas has a past medical history of Anxiety, Arthritis, Chicken pox, Dyspnea, Dysrhythmia, Fatigue, GERD (gastroesophageal reflux disease), Hair loss, Heart murmur, Heartburn, Hyperlipidemia, Migraines, Peptic ulcer, and Thyroid nodule.   She has a past surgical history that includes Cholecystectomy; Breast surgery; Shoulder open rotator cuff repair (Right, 03/31/2013); Tonsillectomy and adenoidectomy; Dilation and curettage of uterus; Breast excisional biopsy (Left, 2005-2010); Cesarean section; and Knee arthroscopy with lateral menisectomy (Left, 05/18/2019).   Her family history includes Arthritis in her mother; Breast cancer (age of onset: 58) in her mother; Chronic fatigue in her mother; Diabetes in her father and paternal grandfather; Prostate cancer in her father.She reports that she has never smoked. She has never used smokeless tobacco. She reports current alcohol use of about 1.0 standard drink per week. She reports that she does not use drugs.  Outpatient Medications Prior to Visit  Medication Sig Dispense Refill   acetaminophen (TYLENOL) 500 MG tablet Take 500 mg by mouth every 6 (six) hours as needed.     cholecalciferol (VITAMIN D3) 25 MCG (1000 UT) tablet Take 1,000 Units by mouth daily.     meclizine (ANTIVERT) 12.5 MG tablet Take 1 tablet (12.5 mg total) by mouth 3 (three) times daily as needed for dizziness. Can cause drowsiness. 30 tablet 1   Multiple Vitamin (MULTI-VITAMIN) tablet Take 1 tablet by mouth daily.      pantoprazole (PROTONIX) 20 MG tablet      busPIRone (BUSPAR) 7.5 MG tablet Take 1 tablet (7.5 mg total) by mouth 3 (three) times daily. (Patient not taking: No sig reported) 90 tablet 11   No facility-administered medications prior to visit.    Review of Systems  Patient denies headache,  fevers, malaise, unintentional weight loss, skin rash, eye pain, sinus congestion and sinus pain, sore throat, dysphagia,  hemoptysis , cough, dyspnea, wheezing, chest pain, palpitations, orthopnea, edema, abdominal pain, nausea, melena, diarrhea, constipation, flank pain, dysuria, hematuria, urinary  Frequency, nocturia, numbness, tingling, seizures,  Focal weakness, Loss of consciousness,  Tremor, insomnia, depression, , and suicidal ideation.     Objective:  BP (!) 158/76 (BP Location: Left Arm, Patient Position: Sitting, Cuff Size: Normal)   Pulse (!) 57   Temp (!) 95.5 F (35.3 C) (Temporal)   Ht 5' (1.524 m)   Wt 127 lb 12.8 oz (58 kg)   SpO2 99%   BMI 24.96 kg/m   Physical Exam   General appearance: alert, cooperative and appears stated age Head: Normocephalic, without obvious abnormality, atraumatic Eyes: conjunctivae/corneas clear. PERRL, EOM's intact. Fundi benign. Ears: normal TM's and external ear canals both ears Nose: Nares normal. Septum midline. Mucosa normal. No drainage or sinus tenderness. Throat: lips, mucosa, and tongue normal; teeth and gums normal Neck: no adenopathy, no carotid bruit, no JVD, supple, symmetrical, trachea midline and thyroid not enlarged, symmetric, no tenderness/mass/nodules Lungs: clear to auscultation bilaterally with occasional end inspiration squeaks Breasts: normal appearance, no masses or tenderness Heart: regular rate and rhythm, S1, S2 normal, no murmur, click, rub or gallop Abdomen: soft, non-tender; bowel sounds normal; no masses,  no organomegaly  Extremities: extremities normal, atraumatic, no cyanosis or edema Pulses: 2+ and symmetric Skin: Skin color, texture, turgor normal. No rashes or lesions Neurologic: Alert and oriented X 3, normal strength and tone. Normal symmetric reflexes. Normal coordination and gait.    Psych: affect normal, makes good eye contact. No fidgeting,  Smiles easily. Speech is articulate and non pressured.    Denies suicidal thoughts     Assessment & Plan:   Problem List Items Addressed This Visit     Subclinical hypothyroidism    TSH is elevated without  medication but she is asymptomatic   Lab Results  Component Value Date   TSH 6.46 (H) 06/27/2021         Pure hypercholesterolemia   Relevant Orders   Comprehensive metabolic panel (Completed)   Lipid panel (Completed)   Generalized anxiety disorder    trial of buspirone was too sedating for daytime use. Trial of zoloft .       Relevant Medications   sertraline (ZOLOFT) 50 MG tablet   Insomnia due to anxiety and fear    Using buspirone to rest at night      Interstitial lung disease [facility])    She is asymptomatic currently and has an annual CT and follow up with Dr Jayme Cloud      Osteopenia    Bone Density scores from 2018 reviewed:  she has osteopenia,  Mild.  Would repeat in 2023 and consider therapy then if there is a significant change. Continue calcium, vitamin d and weight bearing exercise on a regular basis.       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  I have personally reviewed and have noted:   1) the patient's medical and social history 2) The pt's use of alcohol, tobacco, and illicit drugs 3) The patient's current medications and supplements 4) Functional ability including ADL's, fall risk, home safety risk, hearing and visual impairment 5) Diet and physical activities 6) Evidence for depression or mood disorder 7) The patient's height, weight, and BMI have been recorded in the chart   I have made referrals, and provided counseling and education based on review of the above      Other Visit Diagnoses      Encounter for screening mammogram for malignant neoplasm of breast    -  Primary   Relevant Orders   MM 3D SCREEN BREAST BILATERAL   Postmenopausal estrogen deficiency       Relevant Orders   DG Bone Density       I am having Kimberly Rojas start on sertraline. I am also having her maintain her cholecalciferol, Multi-Vitamin, acetaminophen, busPIRone, pantoprazole, and meclizine.  Meds ordered this encounter  Medications   sertraline (ZOLOFT) 50 MG tablet    Sig: Take 1 tablet (50 mg total) by mouth daily.    Dispense:  30 tablet    Refill:  3    There are no discontinued medications.  Follow-up: Return in about 4 weeks (around 07/24/2021).   Sherlene Shams, MD

## 2021-06-27 ENCOUNTER — Other Ambulatory Visit: Payer: Self-pay

## 2021-06-27 ENCOUNTER — Other Ambulatory Visit (INDEPENDENT_AMBULATORY_CARE_PROVIDER_SITE_OTHER): Payer: Medicare Other

## 2021-06-27 DIAGNOSIS — E7800 Pure hypercholesterolemia, unspecified: Secondary | ICD-10-CM | POA: Diagnosis not present

## 2021-06-27 DIAGNOSIS — E039 Hypothyroidism, unspecified: Secondary | ICD-10-CM | POA: Diagnosis not present

## 2021-06-27 LAB — COMPREHENSIVE METABOLIC PANEL
ALT: 16 U/L (ref 0–35)
AST: 22 U/L (ref 0–37)
Albumin: 4 g/dL (ref 3.5–5.2)
Alkaline Phosphatase: 89 U/L (ref 39–117)
BUN: 15 mg/dL (ref 6–23)
CO2: 29 mEq/L (ref 19–32)
Calcium: 9.1 mg/dL (ref 8.4–10.5)
Chloride: 105 mEq/L (ref 96–112)
Creatinine, Ser: 0.81 mg/dL (ref 0.40–1.20)
GFR: 70.09 mL/min (ref >60.00)
Glucose, Bld: 104 mg/dL — ABNORMAL HIGH (ref 70–99)
Potassium: 4.1 mEq/L (ref 3.5–5.1)
Sodium: 141 mEq/L (ref 135–145)
Total Bilirubin: 0.5 mg/dL (ref 0.2–1.2)
Total Protein: 6.5 g/dL (ref 6.0–8.3)

## 2021-06-27 LAB — LIPID PANEL
Cholesterol: 222 mg/dL — ABNORMAL HIGH (ref 0–200)
HDL: 58.9 mg/dL (ref >39.00)
LDL Cholesterol: 144 mg/dL — ABNORMAL HIGH (ref 0–99)
NonHDL: 163.22
Total CHOL/HDL Ratio: 4
Triglycerides: 94 mg/dL (ref 0.0–149.0)
VLDL: 18.8 mg/dL (ref 0.0–40.0)

## 2021-06-27 LAB — TSH: TSH: 6.46 u[IU]/mL — ABNORMAL HIGH (ref 0.35–5.50)

## 2021-06-27 NOTE — Assessment & Plan Note (Addendum)
trial of buspirone was too sedating for daytime use. Trial of zoloft .

## 2021-06-27 NOTE — Assessment & Plan Note (Signed)
TSH is elevated without  medication but she is asymptomatic   Lab Results  Component Value Date   TSH 6.46 (H) 06/27/2021

## 2021-06-27 NOTE — Assessment & Plan Note (Addendum)
Using buspirone to rest at night

## 2021-06-27 NOTE — Assessment & Plan Note (Signed)

## 2021-06-27 NOTE — Assessment & Plan Note (Signed)
Home readings on a certified machine have been < 130/80

## 2021-06-27 NOTE — Assessment & Plan Note (Signed)
Bone Density scores from 2018 reviewed:  she has osteopenia,  Mild.  Would repeat in 2023 and consider therapy then if there is a significant change. Continue calcium, vitamin d and weight bearing exercise on a regular basis.

## 2021-06-29 ENCOUNTER — Telehealth: Payer: Self-pay

## 2021-06-29 NOTE — Telephone Encounter (Signed)
Spoke with pt and she stated that she is taking the 1/2 tablet every morning with breakfast. Pt was advised to take with a different meal to see if it helps and if not to stop the medication. Pt gave a verbal understanding.

## 2021-06-29 NOTE — Telephone Encounter (Signed)
I have been having upset stomach and acid reflux and a lot of burping.  How long can I expect this to last?  Can I take anything for it?

## 2021-06-29 NOTE — Telephone Encounter (Signed)
Spoke with pt and she stated that since starting the zoloft she has had an upset stomach, acid reflux and belching. Pt is not taking anything to help with the symptoms and would like to know if there is something that she can take and how long this should occur after starting this medication. Pt stated that she is having no other symptoms.

## 2021-07-03 ENCOUNTER — Telehealth: Payer: Self-pay | Admitting: Internal Medicine

## 2021-07-03 NOTE — Telephone Encounter (Signed)
Spoke with pt to let her know that her appt on 07/30/2021 is fine for her follow up and does not need to be seen sooner.

## 2021-07-03 NOTE — Telephone Encounter (Signed)
Patient called and said that Dr Darrick Huntsman needed to see her for a follow up. Patient has a follow up already scheduled for 07/04/2021. Does she need to come in sooner?

## 2021-07-03 NOTE — Telephone Encounter (Signed)
Appt is for 07/30/2021. I believe you are wanting to see pt for a follow up on the atherosclerosis that was found on her CT scan. Does pt need to be seen sooner than 07/30/2021?

## 2021-07-17 DIAGNOSIS — R110 Nausea: Secondary | ICD-10-CM | POA: Diagnosis not present

## 2021-07-17 DIAGNOSIS — R140 Abdominal distension (gaseous): Secondary | ICD-10-CM | POA: Diagnosis not present

## 2021-07-17 DIAGNOSIS — R1013 Epigastric pain: Secondary | ICD-10-CM | POA: Diagnosis not present

## 2021-07-30 ENCOUNTER — Other Ambulatory Visit: Payer: Self-pay

## 2021-07-30 ENCOUNTER — Ambulatory Visit (INDEPENDENT_AMBULATORY_CARE_PROVIDER_SITE_OTHER): Payer: Medicare Other | Admitting: Internal Medicine

## 2021-07-30 ENCOUNTER — Encounter: Payer: Self-pay | Admitting: Internal Medicine

## 2021-07-30 VITALS — BP 130/75 | HR 86 | Temp 96.6°F | Ht 60.0 in | Wt 130.2 lb

## 2021-07-30 DIAGNOSIS — R110 Nausea: Secondary | ICD-10-CM

## 2021-07-30 DIAGNOSIS — E7800 Pure hypercholesterolemia, unspecified: Secondary | ICD-10-CM | POA: Diagnosis not present

## 2021-07-30 DIAGNOSIS — F411 Generalized anxiety disorder: Secondary | ICD-10-CM

## 2021-07-30 DIAGNOSIS — I700 Atherosclerosis of aorta: Secondary | ICD-10-CM

## 2021-07-30 MED ORDER — ESCITALOPRAM OXALATE 10 MG PO TABS: 10.0000 mg | ORAL_TABLET | Freq: Every day | ORAL | 2 refills | Status: DC

## 2021-07-30 MED ORDER — ATORVASTATIN CALCIUM 20 MG PO TABS: 20.0000 mg | ORAL_TABLET | ORAL | 1 refills | Status: DC

## 2021-07-30 NOTE — Progress Notes (Signed)
Subjective:  Patient ID: Kimberly Rojas, female    DOB: [birth year]  Age: 75 y.o. MRN: [id number]  CC: The primary encounter diagnosis was Nausea. Diagnoses of Pure hypercholesterolemia, Generalized anxiety disorder, and Thoracic aortic atherosclerosis (HCC) were also pertinent to this visit.  HPI Kimberly Rojas presents for  Chief Complaint  Patient presents with   Follow-up    Follow up on anxiety, pt was started on Sertraline.     This visit occurred durng the SARS-CoV-2 public health emergency.  Safety protocols were in place, including screening questions prior to the visit, additional usage of staff PPE, and extensive cleaning of exam room while observing appropriate contact time as indicated for disinfecting solutions.   GAD:  Kimberly Rojas was prescribed  Sertraline one month ago and started with 25 mg daily with breakfast.  She has increased her dose to 50 mg despite having persistent daily nausea with the lower dose .  In spite of the daily nausea,  she feels less anxious since starting the  medication and wants to continue some form of pharmacotherapy.  She has seen her gastroenterologist who has prescribed a PPI and plans to pursue diagnostic testing if her nausea persists. However, the nausea is directly related to use of the SSRI and is not present if she suspends the dose for a day.    HTN:  white coat only.  Checks BP at home .  135/74 at last office visit by [facility].   Aortic atherosclerosis:  Reviewed findings of prior CT scan today..  Patient is willing to  Initiate statin therapy starting with 20 mg atorvastatin every other day.   Outpatient Medications Prior to Visit  Medication Sig Dispense Refill   acetaminophen (TYLENOL) 500 MG tablet Take 500 mg by mouth every 6 (six) hours as needed.     cholecalciferol (VITAMIN D3) 25 MCG (1000 UT) tablet Take 1,000 Units by mouth daily.     meclizine (ANTIVERT) 12.5 MG tablet Take 1 tablet (12.5 mg total) by mouth 3 (three) times  daily as needed for dizziness. Can cause drowsiness. 30 tablet 1   Multiple Vitamin (MULTI-VITAMIN) tablet Take 1 tablet by mouth daily.      pantoprazole (PROTONIX) 20 MG tablet      vitamin B-12 (CYANOCOBALAMIN) 1000 MCG tablet Take 1,000 mcg by mouth daily.     busPIRone (BUSPAR) 7.5 MG tablet Take 1 tablet (7.5 mg total) by mouth 3 (three) times daily. (Patient not taking: Reported on 06/05/2021) 90 tablet 11   sertraline (ZOLOFT) 50 MG tablet Take 1 tablet (50 mg total) by mouth daily. (Patient not taking: Reported on 07/30/2021) 30 tablet 3   No facility-administered medications prior to visit.    Review of Systems;  Patient denies headache, fevers, malaise, unintentional weight loss, skin rash, eye pain, sinus congestion and sinus pain, sore throat, dysphagia,  hemoptysis , cough, dyspnea, wheezing, chest pain, palpitations, orthopnea, edema, abdominal pain, nausea, melena, diarrhea, constipation, flank pain, dysuria, hematuria, urinary  Frequency, nocturia, numbness, tingling, seizures,  Focal weakness, Loss of consciousness,  Tremor, insomnia, depression, anxiety, and suicidal ideation.      Objective:  BP 130/75 (BP Location: Left Arm, Patient Position: Sitting, Cuff Size: Normal)   Pulse 86   Temp (!) 96.6 F (35.9 C) (Temporal)   Ht 5' (1.524 m)   Wt 130 lb 3.2 oz (59.1 kg)   SpO2 98%   BMI 25.43 kg/m   BP Readings from Last 3 Encounters:  07/30/21 130/75  06/26/21 (!) 158/76  10/09/20 (!) 150/82    Wt Readings from Last 3 Encounters:  07/30/21 130 lb 3.2 oz (59.1 kg)  06/26/21 127 lb 12.8 oz (58 kg)  06/05/21 128 lb (58.1 kg)    General appearance: alert, cooperative and appears stated age Ears: normal TM's and external ear canals both ears Throat: lips, mucosa, and tongue normal; teeth and gums normal Neck: no adenopathy, no carotid bruit, supple, symmetrical, trachea midline and thyroid not enlarged, symmetric, no tenderness/mass/nodules Back: symmetric, no  curvature. ROM normal. No CVA tenderness. Lungs: clear to auscultation bilaterally Heart: regular rate and rhythm, S1, S2 normal, no murmur, click, rub or gallop Abdomen: soft, non-tender; bowel sounds normal; no masses,  no organomegaly Pulses: 2+ and symmetric Skin: Skin color, texture, turgor normal. No rashes or lesions Lymph nodes: Cervical, supraclavicular, and axillary nodes normal.  Lab Results  Component Value Date   HGBA1C 5.7 10/24/2015    Lab Results  Component Value Date   CREATININE 0.81 06/27/2021   CREATININE 0.88 10/09/2020   CREATININE 0.80 02/07/2020    Lab Results  Component Value Date   WBC 11.1 (H) 10/09/2020   HGB 14.2 10/09/2020   HCT 42.1 10/09/2020   PLT 240.0 10/09/2020   GLUCOSE 104 (H) 06/27/2021   CHOL 222 (H) 06/27/2021   TRIG 94.0 06/27/2021   HDL 58.90 06/27/2021   LDLDIRECT 141.0 09/18/2017   LDLCALC 144 (H) 06/27/2021   ALT 16 06/27/2021   AST 22 06/27/2021   NA 141 06/27/2021   K 4.1 06/27/2021   CL 105 06/27/2021   CREATININE 0.81 06/27/2021   BUN 15 06/27/2021   CO2 29 06/27/2021   TSH 6.46 (H) 06/27/2021   INR 1.03 03/26/2013   HGBA1C 5.7 10/24/2015    CT Chest Wo Contrast  Result Date: 01/03/2021 CLINICAL DATA:  Follow-up lung nodules. Never smoker. EXAM: CT CHEST WITHOUT CONTRAST TECHNIQUE: Multidetector CT imaging of the chest was performed following the standard protocol without IV contrast. COMPARISON:  CT 12/17/2019 FINDINGS: Cardiovascular: Coronary artery calcification and aortic atherosclerotic calcification. Mediastinum/Nodes: No axillary or supraclavicular adenopathy. No mediastinal or hilar adenopathy. No pericardial fluid. Esophagus normal. Lungs/Pleura: Pleural nodularity in the RIGHT middle lobe measuring 5 mm and 6 mm on image 87/3 is not changed from comparison exam. Nodularity is in a bronchial pattern and favored to represent peripheral mucous plugging. There is subpleural reticulation throughout the lungs not  changed from prior. No frank honeycombing or cystic change. Mild bronchiectasis at the RIGHT lung base also unchanged. Upper Abdomen: Limited view of the liver, kidneys, pancreas are unremarkable. Normal adrenal glands. Musculoskeletal: No aggressive osseous lesion. IMPRESSION: 1. Stable benign-appearing nodularity in the RIGHT middle lobe. 2. Mild peripheral interstitial lung disease not changed from comparison exam. Electronically Signed   By: Suzy Bouchard M.D.   On: 01/03/2021 11:19    Assessment & Plan:   Problem List Items Addressed This Visit     Pure hypercholesterolemia    Trial of atorvastatin QOD advised given recent finding of AA on films.  Diet reviewed as well       Relevant Medications   atorvastatin (LIPITOR) 20 MG tablet   Generalized anxiety disorder    She would like to continue therapy in spite of the nausea she has been having with use of sertraline. Advised to take a 2 week break from SSRI while starting atorvastatin,  Then resume therapy with 5 mg lexapro daily and advance to 10 mg if tolerated.  If  not tolerated due to recurrence of nausea,  Will change therapy to buspirone 10 mg bid       Relevant Medications   escitalopram (LEXAPRO) 10 MG tablet   Thoracic aortic atherosclerosis ([place])    Noted on recent chest CT done by pulmonology.  Reviewed findings of prior CT scan today..  Patient is willing to  Initiate statin therapy starting with 20 mg atorvastatin every other day       Relevant Medications   atorvastatin (LIPITOR) 20 MG tablet   Other Visit Diagnoses     Nausea    -  Primary   Relevant Orders   Comprehensive metabolic panel       I have discontinued Kelsha A. Mcgilvray's sertraline. I am also having her start on escitalopram and atorvastatin. Additionally, I am having her maintain her cholecalciferol, Multi-Vitamin, acetaminophen, busPIRone, pantoprazole, meclizine, and vitamin B-12.  Meds ordered this encounter  Medications   escitalopram  (LEXAPRO) 10 MG tablet    Sig: Take 1 tablet (10 mg total) by mouth daily.    Dispense:  30 tablet    Refill:  2   atorvastatin (LIPITOR) 20 MG tablet    Sig: Take 1 tablet (20 mg total) by mouth every other day.    Dispense:  90 tablet    Refill:  1    Medications Discontinued During This Encounter  Medication Reason   sertraline (ZOLOFT) 50 MG tablet     Follow-up: Return in about 3 months (around 10/30/2021).   Crecencio Mc, MD   Subjective:  Patient ID: Kimberly Rojas, female    DOB: 27-Nov-1945  Age: 75 y.o. MRN: [id number]  CC: The primary encounter diagnosis was Nausea. Diagnoses of Pure hypercholesterolemia, Generalized anxiety disorder, and Thoracic aortic atherosclerosis ([place]) were also pertinent to this visit.  HPI Kimberly Rojas presents for  Chief Complaint  Patient presents with   Follow-up    Follow up on anxiety, pt was started on Sertraline.       Outpatient Medications Prior to Visit  Medication Sig Dispense Refill   acetaminophen (TYLENOL) 500 MG tablet Take 500 mg by mouth every 6 (six) hours as needed.     cholecalciferol (VITAMIN D3) 25 MCG (1000 UT) tablet Take 1,000 Units by mouth daily.     meclizine (ANTIVERT) 12.5 MG tablet Take 1 tablet (12.5 mg total) by mouth 3 (three) times daily as needed for dizziness. Can cause drowsiness. 30 tablet 1   Multiple Vitamin (MULTI-VITAMIN) tablet Take 1 tablet by mouth daily.      pantoprazole (PROTONIX) 20 MG tablet      vitamin B-12 (CYANOCOBALAMIN) 1000 MCG tablet Take 1,000 mcg by mouth daily.     busPIRone (BUSPAR) 7.5 MG tablet Take 1 tablet (7.5 mg total) by mouth 3 (three) times daily. (Patient not taking: Reported on 06/05/2021) 90 tablet 11   sertraline (ZOLOFT) 50 MG tablet Take 1 tablet (50 mg total) by mouth daily. (Patient not taking: Reported on 07/30/2021) 30 tablet 3   No facility-administered medications prior to visit.    Review of Systems;  Patient denies headache, fevers, malaise,  unintentional weight loss, skin rash, eye pain, sinus congestion and sinus pain, sore throat, dysphagia,  hemoptysis , cough, dyspnea, wheezing, chest pain, palpitations, orthopnea, edema, abdominal pain, nausea, melena, diarrhea, constipation, flank pain, dysuria, hematuria, urinary  Frequency, nocturia, numbness, tingling, seizures,  Focal weakness, Loss of consciousness,  Tremor, insomnia, depression, anxiety, and suicidal ideation.  Objective:  BP 130/75 (BP Location: Left Arm, Patient Position: Sitting, Cuff Size: Normal)   Pulse 86   Temp (!) 96.6 F (35.9 C) (Temporal)   Ht 5' (1.524 m)   Wt 130 lb 3.2 oz (59.1 kg)   SpO2 98%   BMI 25.43 kg/m   BP Readings from Last 3 Encounters:  07/30/21 130/75  06/26/21 (!) 158/76  10/09/20 (!) 150/82    Wt Readings from Last 3 Encounters:  07/30/21 130 lb 3.2 oz (59.1 kg)  06/26/21 127 lb 12.8 oz (58 kg)  06/05/21 128 lb (58.1 kg)    General appearance: alert, cooperative and appears stated age Ears: normal TM's and external ear canals both ears Throat: lips, mucosa, and tongue normal; teeth and gums normal Neck: no adenopathy, no carotid bruit, supple, symmetrical, trachea midline and thyroid not enlarged, symmetric, no tenderness/mass/nodules Back: symmetric, no curvature. ROM normal. No CVA tenderness. Lungs: clear to auscultation bilaterally Heart: regular rate and rhythm, S1, S2 normal, no murmur, click, rub or gallop Abdomen: soft, non-tender; bowel sounds normal; no masses,  no organomegaly Pulses: 2+ and symmetric Skin: Skin color, texture, turgor normal. No rashes or lesions Lymph nodes: Cervical, supraclavicular, and axillary nodes normal.  Lab Results  Component Value Date   HGBA1C 5.7 10/24/2015    Lab Results  Component Value Date   CREATININE 0.81 06/27/2021   CREATININE 0.88 10/09/2020   CREATININE 0.80 02/07/2020    Lab Results  Component Value Date   WBC 11.1 (H) 10/09/2020   HGB 14.2 10/09/2020    HCT 42.1 10/09/2020   PLT 240.0 10/09/2020   GLUCOSE 104 (H) 06/27/2021   CHOL 222 (H) 06/27/2021   TRIG 94.0 06/27/2021   HDL 58.90 06/27/2021   LDLDIRECT 141.0 09/18/2017   LDLCALC 144 (H) 06/27/2021   ALT 16 06/27/2021   AST 22 06/27/2021   NA 141 06/27/2021   K 4.1 06/27/2021   CL 105 06/27/2021   CREATININE 0.81 06/27/2021   BUN 15 06/27/2021   CO2 29 06/27/2021   TSH 6.46 (H) 06/27/2021   INR 1.03 03/26/2013   HGBA1C 5.7 10/24/2015    CT Chest Wo Contrast  Result Date: 01/03/2021 CLINICAL DATA:  Follow-up lung nodules. Never smoker. EXAM: CT CHEST WITHOUT CONTRAST TECHNIQUE: Multidetector CT imaging of the chest was performed following the standard protocol without IV contrast. COMPARISON:  CT 12/17/2019 FINDINGS: Cardiovascular: Coronary artery calcification and aortic atherosclerotic calcification. Mediastinum/Nodes: No axillary or supraclavicular adenopathy. No mediastinal or hilar adenopathy. No pericardial fluid. Esophagus normal. Lungs/Pleura: Pleural nodularity in the RIGHT middle lobe measuring 5 mm and 6 mm on image 87/3 is not changed from comparison exam. Nodularity is in a bronchial pattern and favored to represent peripheral mucous plugging. There is subpleural reticulation throughout the lungs not changed from prior. No frank honeycombing or cystic change. Mild bronchiectasis at the RIGHT lung base also unchanged. Upper Abdomen: Limited view of the liver, kidneys, pancreas are unremarkable. Normal adrenal glands. Musculoskeletal: No aggressive osseous lesion. IMPRESSION: 1. Stable benign-appearing nodularity in the RIGHT middle lobe. 2. Mild peripheral interstitial lung disease not changed from comparison exam. Electronically Signed   By: Suzy Bouchard M.D.   On: 01/03/2021 11:19    Assessment & Plan:   Problem List Items Addressed This Visit     Pure hypercholesterolemia    Trial of atorvastatin QOD advised given recent finding of AA on films.  Diet reviewed as  well       Relevant Medications  atorvastatin (LIPITOR) 20 MG tablet   Generalized anxiety disorder    She would like to continue therapy in spite of the nausea she has been having with use of sertraline. Advised to take a 2 week break from SSRI while starting atorvastatin,  Then resume therapy with 5 mg lexapro daily and advance to 10 mg if tolerated.  If not tolerated due to recurrence of nausea,  Will change therapy to buspirone 10 mg bid       Relevant Medications   escitalopram (LEXAPRO) 10 MG tablet   Thoracic aortic atherosclerosis ([place])    Noted on recent chest CT done by pulmonology.  Reviewed findings of prior CT scan today..  Patient is willing to  Initiate statin therapy starting with 20 mg atorvastatin every other day       Relevant Medications   atorvastatin (LIPITOR) 20 MG tablet   Other Visit Diagnoses     Nausea    -  Primary   Relevant Orders   Comprehensive metabolic panel      I provided  30 minutes of  face-to-face time during this encounter reviewing patient's current problems and past surgeries, labs and imaging studies, providing counseling on the above mentioned problems , and coordination  of care .  Meds ordered this encounter  Medications   escitalopram (LEXAPRO) 10 MG tablet    Sig: Take 1 tablet (10 mg total) by mouth daily.    Dispense:  30 tablet    Refill:  2   atorvastatin (LIPITOR) 20 MG tablet    Sig: Take 1 tablet (20 mg total) by mouth every other day.    Dispense:  90 tablet    Refill:  1    Medications Discontinued During This Encounter  Medication Reason   sertraline (ZOLOFT) 50 MG tablet     Follow-up: Return in about 3 months (around 10/30/2021).   Crecencio Mc, MD

## 2021-07-30 NOTE — Patient Instructions (Addendum)
Stop the sertraline for now  Start atorvastatin every other day for aortic atherosclerosis(to prevent heart attacks and strokes)   If you have had no recurrence of  nausea or new onset muscle pain after 3 weeks,  return for liver enzyme blood test and continue the medication   In a few weeks we will resume medication for anxiety with generic lexapro.    Start  with 5 mg ( 1/2 tablet )  with a big breakfast.   Postpone your dose to lunchtime if you wake up late.  If nausea returns ,  let me know,  so we can come up with plan C (buspirone)

## 2021-07-31 DIAGNOSIS — I700 Atherosclerosis of aorta: Secondary | ICD-10-CM | POA: Insufficient documentation

## 2021-07-31 NOTE — Assessment & Plan Note (Signed)
Trial of atorvastatin QOD advised given recent finding of AA on films.  Diet reviewed as well

## 2021-07-31 NOTE — Assessment & Plan Note (Signed)
She would like to continue therapy in spite of the nausea she has been having with use of sertraline. Advised to take a 2 week break from SSRI while starting atorvastatin,  Then resume therapy with 5 mg lexapro daily and advance to 10 mg if tolerated.  If not tolerated due to recurrence of nausea,  Will change therapy to buspirone 10 mg bid

## 2021-07-31 NOTE — Assessment & Plan Note (Signed)
Noted on recent chest CT done by pulmonology.  Reviewed findings of prior CT scan today..  Patient is willing to  Initiate statin therapy starting with 20 mg atorvastatin every other day

## 2021-08-15 ENCOUNTER — Ambulatory Visit
Admission: RE | Admit: 2021-08-15 | Discharge: 2021-08-15 | Disposition: A | Discharge status: Home / Self Care | Payer: Medicare Other | Source: Ambulatory Visit | Attending: Internal Medicine | Admitting: Internal Medicine

## 2021-08-15 ENCOUNTER — Other Ambulatory Visit: Payer: Self-pay

## 2021-08-15 DIAGNOSIS — Z1231 Encounter for screening mammogram for malignant neoplasm of breast: Secondary | ICD-10-CM

## 2021-08-15 DIAGNOSIS — Z780 Asymptomatic menopausal state: Secondary | ICD-10-CM | POA: Diagnosis not present

## 2021-08-15 DIAGNOSIS — M8589 Other specified disorders of bone density and structure, multiple sites: Secondary | ICD-10-CM | POA: Diagnosis not present

## 2021-08-20 ENCOUNTER — Other Ambulatory Visit: Payer: Self-pay

## 2021-08-20 ENCOUNTER — Other Ambulatory Visit (INDEPENDENT_AMBULATORY_CARE_PROVIDER_SITE_OTHER): Payer: Medicare Other

## 2021-08-20 DIAGNOSIS — R110 Nausea: Secondary | ICD-10-CM

## 2021-08-20 LAB — COMPREHENSIVE METABOLIC PANEL
ALT: 17 U/L (ref 0–35)
AST: 21 U/L (ref 0–37)
Albumin: 3.8 g/dL (ref 3.5–5.2)
Alkaline Phosphatase: 103 U/L (ref 39–117)
BUN: 17 mg/dL (ref 6–23)
CO2: 31 mEq/L (ref 19–32)
Calcium: 9.3 mg/dL (ref 8.4–10.5)
Chloride: 103 mEq/L (ref 96–112)
Creatinine, Ser: 0.78 mg/dL (ref 0.40–1.20)
GFR: 73.26 mL/min (ref >60.00)
Glucose, Bld: 97 mg/dL (ref 70–99)
Potassium: 3.9 mEq/L (ref 3.5–5.1)
Sodium: 140 mEq/L (ref 135–145)
Total Bilirubin: 0.4 mg/dL (ref 0.2–1.2)
Total Protein: 6.8 g/dL (ref 6.0–8.3)

## 2021-08-23 DIAGNOSIS — M436 Torticollis: Secondary | ICD-10-CM | POA: Diagnosis not present

## 2021-08-29 DIAGNOSIS — H6123 Impacted cerumen, bilateral: Secondary | ICD-10-CM | POA: Diagnosis not present

## 2021-09-21 DIAGNOSIS — M1712 Unilateral primary osteoarthritis, left knee: Secondary | ICD-10-CM | POA: Diagnosis not present

## 2021-10-03 ENCOUNTER — Other Ambulatory Visit: Payer: Self-pay

## 2021-10-03 ENCOUNTER — Encounter: Payer: Self-pay | Admitting: Internal Medicine

## 2021-10-03 ENCOUNTER — Ambulatory Visit (INDEPENDENT_AMBULATORY_CARE_PROVIDER_SITE_OTHER): Payer: Medicare Other | Admitting: Internal Medicine

## 2021-10-03 VITALS — BP 124/72 | HR 75 | Temp 97.9°F | Ht 60.0 in | Wt 125.8 lb

## 2021-10-03 DIAGNOSIS — F409 Phobic anxiety disorder, unspecified: Secondary | ICD-10-CM

## 2021-10-03 DIAGNOSIS — M85859 Other specified disorders of bone density and structure, unspecified thigh: Secondary | ICD-10-CM

## 2021-10-03 DIAGNOSIS — R030 Elevated blood-pressure reading, without diagnosis of hypertension: Secondary | ICD-10-CM | POA: Diagnosis not present

## 2021-10-03 DIAGNOSIS — Z79899 Other long term (current) drug therapy: Secondary | ICD-10-CM | POA: Diagnosis not present

## 2021-10-03 DIAGNOSIS — E7800 Pure hypercholesterolemia, unspecified: Secondary | ICD-10-CM

## 2021-10-03 DIAGNOSIS — E538 Deficiency of other specified B group vitamins: Secondary | ICD-10-CM

## 2021-10-03 DIAGNOSIS — E038 Other specified hypothyroidism: Secondary | ICD-10-CM

## 2021-10-03 DIAGNOSIS — I700 Atherosclerosis of aorta: Secondary | ICD-10-CM | POA: Diagnosis not present

## 2021-10-03 DIAGNOSIS — F5105 Insomnia due to other mental disorder: Secondary | ICD-10-CM

## 2021-10-03 DIAGNOSIS — F411 Generalized anxiety disorder: Secondary | ICD-10-CM

## 2021-10-03 LAB — LDL CHOLESTEROL, DIRECT: Direct LDL: 70 mg/dL

## 2021-10-03 LAB — COMPREHENSIVE METABOLIC PANEL
ALT: 18 U/L (ref 0–35)
AST: 21 U/L (ref 0–37)
Albumin: 4.1 g/dL (ref 3.5–5.2)
Alkaline Phosphatase: 105 U/L (ref 39–117)
BUN: 15 mg/dL (ref 6–23)
CO2: 32 mEq/L (ref 19–32)
Calcium: 9.4 mg/dL (ref 8.4–10.5)
Chloride: 98 mEq/L (ref 96–112)
Creatinine, Ser: 0.84 mg/dL (ref 0.40–1.20)
GFR: 66.97 mL/min (ref >60.00)
Glucose, Bld: 115 mg/dL — ABNORMAL HIGH (ref 70–99)
Potassium: 3.9 mEq/L (ref 3.5–5.1)
Sodium: 135 mEq/L (ref 135–145)
Total Bilirubin: 0.5 mg/dL (ref 0.2–1.2)
Total Protein: 6.6 g/dL (ref 6.0–8.3)

## 2021-10-03 LAB — VITAMIN B12: Vitamin B-12: 862 pg/mL (ref 211–911)

## 2021-10-03 MED ORDER — BUSPIRONE HCL 7.5 MG PO TABS: 7.5000 mg | ORAL_TABLET | Freq: Three times a day (TID) | ORAL | 0 refills | Status: DC

## 2021-10-03 NOTE — Assessment & Plan Note (Signed)
Now taking 100 mcg daily in an MVI baseline rechecked today

## 2021-10-03 NOTE — Assessment & Plan Note (Signed)
She remains asymptomatic.  Lab Results  Component Value Date   TSH 6.46 (H) 06/27/2021

## 2021-10-03 NOTE — Patient Instructions (Addendum)
Resume sertraline for anxiety , but start with 1/2 tablet with a full dinner.  If no nausea after one week,  increase to full tablet  If nausea returns we can resume buspirone instead    Do not resume pantoprazole unless reflux returns.  You can treat instant reflux with FAMOTIDINE OR MYLANTA    You can use the buspirone now just in the evening if you can't go back to sleep   Increase the protein in diet with :  greek yogurt,  cheese,  nuts,  protein shakes  and chicken .  Non cholesterol sources of protein  include lentils,  legumes,  and nuts.

## 2021-10-03 NOTE — Assessment & Plan Note (Signed)
Tolerating atorvastatin 20 mg every other day,  Started Nov 28 for AA

## 2021-10-03 NOTE — Assessment & Plan Note (Addendum)
Bone Density scores from 2018 reviewed:  she has osteopenia,  Mild.  Repeat DEXA Dec 2022 notes significant change but her scores remain in  the mild osteopenia range. Continue calcium, vitamin d and weight bearing exercise on a regular basis.

## 2021-10-03 NOTE — Assessment & Plan Note (Signed)
Noted on recent chest CT done by pulmonology.  Reviewed findings of prior CT scan today..  She is tolerating high potency  statin therapy starting with 20 mg atorvastatin every other day

## 2021-10-03 NOTE — Assessment & Plan Note (Signed)
Resuming sertraline starting at 25 mg daily with full meal.  Advance to 50 mg if nausea does not occur.  May need to switch to buspirone

## 2021-10-03 NOTE — Assessment & Plan Note (Signed)
adding  buspirone at night, resuming sertraline

## 2021-10-03 NOTE — Progress Notes (Signed)
Subjective:  Patient ID: Kimberly Rojas, female    DOB: 27-May-1946  Age: 76 y.o. MRN: [id number]  CC: The primary encounter diagnosis was Long-term use of high-risk medication. 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   This visit occurred during the SARS-CoV-2 public health emergency.  Safety protocols were in place, including screening questions prior to the visit, additional usage of staff PPE, and extensive cleaning of exam room while observing appropriate contact time as indicated for disinfecting solutions.    HPI Kimberly Rojas presents for follow up on aortic atherosclerosis, , anxiety/depression,  elevated blood pressure   Chief Complaint  Patient presents with   Follow-up    3 month follow up    1) aortic atherosclerosis.   Patient is tolerating high potency statin therapy   started every other day after last visit (atorvastatin 20 mg )   2) nausea secondary to SSRI therapy for GAD .  Resolved with SSRI suspension.  Started having gas pains with pantoprazole so she stopped it.   3) has lost 5 lbs since last visit.  Not cooking much  eats a big breakfast of oatmeal with flax seed, prunes and blueberries with tea and toast. Skips lunch,  eats dinner at 4 pm mostly vegetables.  Drinks premier protein almost  daily.    Outpatient Medications Prior to Visit  Medication Sig Dispense Refill   acetaminophen (TYLENOL) 500 MG tablet Take 500 mg by mouth every 6 (six) hours as needed.     atorvastatin (LIPITOR) 20 MG tablet Take 1 tablet (20 mg total) by mouth every other day. 90 tablet 1   cholecalciferol (VITAMIN D3) 25 MCG (1000 UT) tablet Take 1,000 Units by mouth daily.     meclizine (ANTIVERT) 12.5 MG  tablet Take 1 tablet (12.5 mg total) by mouth 3 (three) times daily as needed for dizziness. Can cause drowsiness. 30 tablet 1   Multiple Vitamin (MULTI-VITAMIN) tablet Take 1 tablet by mouth daily.      pantoprazole (PROTONIX) 20 MG tablet      vitamin B-12 (CYANOCOBALAMIN) 1000 MCG tablet Take 1,000 mcg by mouth daily.     busPIRone (BUSPAR) 7.5 MG tablet Take 1 tablet (7.5 mg total) by mouth 3 (three) times daily. (Patient not taking: Reported on 06/05/2021) 90 tablet 11   escitalopram (LEXAPRO) 10 MG tablet Take 1 tablet (10 mg total) by mouth daily. (Patient not taking: Reported on 10/03/2021) 30 tablet 2   No facility-administered medications prior to visit.    Review of Systems;  Patient denies headache, fevers, malaise, unintentional weight loss, skin rash, eye pain, sinus congestion and sinus pain, sore throat, dysphagia,  hemoptysis , cough, dyspnea, wheezing, chest pain, palpitations, orthopnea, edema, abdominal pain, nausea, melena, diarrhea, constipation, flank pain, dysuria, hematuria, urinary  Frequency, nocturia, numbness, tingling, seizures,  Focal weakness, Loss of consciousness,  Tremor, insomnia, depression, anxiety, and suicidal ideation.      Objective:  BP 124/72 (BP Location: Left Arm, Cuff Size: Normal)    Pulse 75    Temp 97.9 F (36.6 C) (Oral)    Ht 5' (1.524 m)    Wt 125 lb 12.8 oz (57.1 kg)    SpO2 99%    BMI 24.57 kg/m   BP Readings from Last 3 Encounters:  10/03/21 124/72  07/30/21 130/75  06/26/21 (!) 158/76    Wt Readings from Last 3 Encounters:  10/03/21 125 lb 12.8 oz (57.1 kg)  07/30/21 130 lb 3.2 oz (59.1 kg)  06/26/21 127 lb 12.8 oz (58 kg)    General appearance: alert, cooperative and appears stated age Ears: normal TM's and external ear canals both ears Throat: lips, mucosa, and tongue normal; teeth and gums normal Neck: no adenopathy, no carotid bruit, supple, symmetrical, trachea midline and thyroid not enlarged, symmetric, no  tenderness/mass/nodules Back: symmetric, no curvature. ROM normal. No CVA tenderness. Lungs: clear to auscultation bilaterally Heart: regular rate and rhythm, S1, S2 normal, no murmur, click, rub or gallop Abdomen: soft, non-tender; bowel sounds normal; no masses,  no organomegaly Pulses: 2+ and symmetric Skin: Skin color, texture, turgor normal. No rashes or lesions Lymph nodes: Cervical, supraclavicular, and axillary nodes normal. DG Bone Density  Result Date: 08/15/2021 EXAM: DUAL X-RAY ABSORPTIOMETRY (DXA) FOR BONE MINERAL DENSITY IMPRESSION: Your patient Kimberly Rojas completed a BMD test on 08/15/2021 using the [place] (software version: 14.10) manufactured by [employer]. The following summarizes the results of our evaluation. Technologist:VLM PATIENT BIOGRAPHICAL: Name: Kimberly, Rojas Patient ID: [id number] Birth Date: 1945/12/27 Height: 59.5 in. Gender: Female Exam Date: 08/15/2021 Weight: 127.0 lbs. Indications: Advanced Age, Postmenopausal, Caucasian, Height Loss Fractures: Treatments: Multi-Vitamin with calcium, omeprazole, Vitamin D DENSITOMETRY RESULTS: Site         Region     Measured Date Measured Age WHO Classification Young Adult T-score BMD         %Change vs. Previous Significant Change (*) DualFemur Neck Left 08/15/2019 77.4 Osteopenia -1.6 0.811 g/cm2 -5.1% - DualFemur Neck Left 01/08/2017 72.8 Osteopenia -1.3 0.855 g/cm2 - - DualFemur Total Mean 08/15/2021 77.4 Osteopenia -1.5 0.822 g/cm2 -9.7% Yes DualFemur Total Mean 01/08/2017 72.8 Normal -0.8 0.910 g/cm2 - - Left Forearm Radius 33% 08/15/2021 77.4 Osteopenia -1.1 0.777 g/cm2 -8.3% Yes Left Forearm Radius 33% 01/08/2017 72.8 Normal -0.3 0.847 g/cm2 - - ASSESSMENT: The BMD measured at Femur Neck Left is 0.811 g/cm2 with a T-score of -1.6. This patient is considered osteopenic according to [facility]) criteria. Lumbar spine was not utilized due to advanced degenerative changes.  Compared with prior study, there has been significant decrease in the total hip. The scan quality is good. World Pharmacologist [facility]) criteria for post-menopausal, Caucasian Women: Normal:                   T-score at or above -1 SD Osteopenia/low bone mass: T-score between -1 and -2.5 SD Osteoporosis:             T-score at or below -2.5 SD RECOMMENDATIONS: 1. All patients should optimize calcium and vitamin D intake. 2. Consider FDA-approved medical therapies in postmenopausal women and men aged 63 years and older, based on the following: a. A hip or vertebral(clinical or morphometric) fracture b. T-score < -2.5 at the femoral neck or spine after appropriate evaluation to exclude secondary causes c. Low bone mass (T-score between -1.0 and -2.5 at the femoral neck or spine) and a 10-year probability of a hip fracture > 3% or a 10-year probability of a major osteoporosis-related fracture > 20% based on the [place]-adapted WHO algorithm 3. Clinician judgment and/or patient preferences may indicate treatment for people with 10-year fracture probabilities above or below these levels FOLLOW-UP: People with diagnosed cases of osteoporosis or at high risk for fracture should have regular bone mineral density  tests. For patients eligible for Medicare, routine testing is allowed once every 2 years. The testing frequency can be increased to one year for patients who have rapidly progressing disease, those who are receiving or discontinuing medical therapy to restore bone mass, or have additional risk factors. I have reviewed this report, and agree with the above findings. [facility] Dear Crecencio Mc, Your patient AMIEE SHIPES completed a FRAX assessment on 08/15/2021 using the [place] (analysis version: 14.10) manufactured by [employer]. The following summarizes the results of our evaluation. PATIENT BIOGRAPHICAL: Name: Lorelee, Cornelsen Patient ID: [id number] Birth Date: 1945/12/07  Height:    59.5 in. Gender:     Female    [age]       Weight:    127.0 lbs. Ethnicity:  White                            Exam Date: 08/15/2021 FRAX* RESULTS:  (version: 3.5) 10-year Probability of Fracture1 Major Osteoporotic Fracture2 Hip Fracture 13.2% 3.1% Population: [place] (Caucasian) Risk Factors: None Based on Femur (Left) Neck BMD 1 -The 10-year probability of fracture may be lower than reported if the patient has received treatment. 2 -Major Osteoporotic Fracture: Clinical Spine, Forearm, Hip or Shoulder *FRAX is a Materials engineer of the [employer] for Metabolic Bone Disease, a [place] (WHO) [facility]. ASSESSMENT: The probability of a major osteoporotic fracture is 13.2% within the next ten years. The probability of a hip fracture is 3.1% within the next ten years. . Electronically Signed   By: Rolm Baptise M.D.   On: 08/15/2021 15:29   MM 3D SCREEN BREAST BILATERAL  Result Date: 08/16/2021 CLINICAL DATA:  Screening. EXAM: DIGITAL SCREENING BILATERAL MAMMOGRAM WITH TOMOSYNTHESIS AND CAD TECHNIQUE: Bilateral screening digital craniocaudal and mediolateral oblique mammograms were obtained. Bilateral screening digital breast tomosynthesis was performed. The images were evaluated with computer-aided detection. COMPARISON:  Previous exam(s). ACR Breast Density Category b: There are scattered areas of fibroglandular density. FINDINGS: There are no findings suspicious for malignancy. IMPRESSION: No mammographic evidence of malignancy. A result letter of this screening mammogram will be mailed directly to the patient. RECOMMENDATION: Screening mammogram in one year. (Code:SM-B-01Y) BI-RADS CATEGORY  1: Negative. Electronically Signed   By: Audie Pinto M.D.   On: 08/16/2021 08:18   Assessment & Plan:   Problem List Items Addressed This Visit     Subclinical hypothyroidism    She remains asymptomatic.  Lab Results  Component  Value Date   TSH 6.46 (H) 06/27/2021         Pure hypercholesterolemia    Tolerating atorvastatin 20 mg every other day,  Started Nov 28 for AA      Relevant Orders   Direct LDL (Completed)   Insomnia due to anxiety and fear    adding  buspirone at night, resuming sertraline      Osteopenia    Bone Density scores from 2018 reviewed:  she has osteopenia,  Mild.  Repeat DEXA Dec 2022 notes significant change but her scores remain in  the mild osteopenia range. Continue calcium, vitamin d and weight bearing exercise on a regular basis.       White coat syndrome without diagnosis of hypertension    Home readings are always below 130/80 unless anxious .  No treatment advised.  Generalized anxiety disorder    Resuming sertraline starting at 25 mg daily with full meal.  Advance to 50 mg if nausea does not occur.  May need to switch to buspirone       Relevant Medications   busPIRone (BUSPAR) 7.5 MG tablet   B12 deficiency    Now taking 100 mcg daily in an MVI baseline rechecked today       Relevant Orders   Vitamin B12 (Completed)   Thoracic aortic atherosclerosis ([place])    Noted on recent chest CT done by pulmonology.  Reviewed findings of prior CT scan today..  She is tolerating high potency  statin therapy starting with 20 mg atorvastatin every other day       Other Visit Diagnoses     Long-term use of high-risk medication    -  Primary   Relevant Orders   Comprehensive metabolic panel (Completed)       I spent 30 minutes dedicated to the care of this patient on the date of this encounter to include pre-visit review of patient's medical history,  most recent imaging studies, Face-to-face time with the patient , and post visit ordering of testing and therapeutics.    Follow-up: No follow-ups on file.   Crecencio Mc, MD

## 2021-10-03 NOTE — Assessment & Plan Note (Signed)
Home readings are always below 130/80 unless anxious .  No treatment advised.

## 2021-10-22 ENCOUNTER — Encounter: Payer: Self-pay | Admitting: Internal Medicine

## 2021-10-30 DIAGNOSIS — E038 Other specified hypothyroidism: Secondary | ICD-10-CM | POA: Diagnosis not present

## 2021-10-31 ENCOUNTER — Telehealth: Payer: Self-pay | Admitting: Internal Medicine

## 2021-10-31 NOTE — Telephone Encounter (Signed)
Patient is requesting another medication besides atorvastatin (LIPITOR) 20 MG tablet. It is making her back hurt. ?

## 2021-11-01 MED ORDER — ROSUVASTATIN CALCIUM 10 MG PO TABS: 10.0000 mg | ORAL_TABLET | ORAL | 1 refills | Status: DC

## 2021-11-01 NOTE — Telephone Encounter (Signed)
Spoke with pt and she stated that she started Atorvastatin every other day about 2 months ago. She stated that since starting it her back has hurt. Pt is requesting to try a different medication.  ?

## 2021-11-01 NOTE — Telephone Encounter (Signed)
Rosuvastatin sent to pharmacy as alternative to atorvastatin .  Twice weekly  ?

## 2021-11-01 NOTE — Addendum Note (Signed)
Addended by: Sherlene Shams on: 11/01/2021 04:39 PM ? ? Modules accepted: Orders ? ?

## 2021-11-01 NOTE — Telephone Encounter (Signed)
Pt is aware of the medication that has been sent in.  

## 2021-11-05 DIAGNOSIS — M5136 Other intervertebral disc degeneration, lumbar region: Secondary | ICD-10-CM | POA: Diagnosis not present

## 2021-11-05 DIAGNOSIS — M62830 Muscle spasm of back: Secondary | ICD-10-CM | POA: Diagnosis not present

## 2021-11-05 DIAGNOSIS — M5416 Radiculopathy, lumbar region: Secondary | ICD-10-CM | POA: Diagnosis not present

## 2021-11-05 DIAGNOSIS — M48062 Spinal stenosis, lumbar region with neurogenic claudication: Secondary | ICD-10-CM | POA: Diagnosis not present

## 2021-12-05 DIAGNOSIS — M1712 Unilateral primary osteoarthritis, left knee: Secondary | ICD-10-CM | POA: Diagnosis not present

## 2021-12-17 DIAGNOSIS — H905 Unspecified sensorineural hearing loss: Secondary | ICD-10-CM | POA: Diagnosis not present

## 2021-12-26 DIAGNOSIS — H903 Sensorineural hearing loss, bilateral: Secondary | ICD-10-CM | POA: Diagnosis not present

## 2021-12-31 DIAGNOSIS — E038 Other specified hypothyroidism: Secondary | ICD-10-CM | POA: Diagnosis not present

## 2022-01-09 DIAGNOSIS — M1712 Unilateral primary osteoarthritis, left knee: Secondary | ICD-10-CM | POA: Diagnosis not present

## 2022-01-09 DIAGNOSIS — M25462 Effusion, left knee: Secondary | ICD-10-CM | POA: Diagnosis not present

## 2022-01-14 ENCOUNTER — Telehealth: Payer: Self-pay | Admitting: Internal Medicine

## 2022-01-14 DIAGNOSIS — E038 Other specified hypothyroidism: Secondary | ICD-10-CM

## 2022-01-14 DIAGNOSIS — R61 Generalized hyperhidrosis: Secondary | ICD-10-CM

## 2022-01-14 DIAGNOSIS — E7800 Pure hypercholesterolemia, unspecified: Secondary | ICD-10-CM

## 2022-01-14 NOTE — Telephone Encounter (Signed)
Spoke with pt and she stated that she just had her thyroid checked about a week ago. Pt stated that she would bring [place] a copy of her results that were done by her endocrinologist.  ?

## 2022-01-14 NOTE — Telephone Encounter (Signed)
Spoke with pt and she stated that she has been having night sweats every night and hoarseness. Pt stated that she thought it was coming from her cholesterol medication but she stated that she spoke with her endocrinologist and they advised that she stop her thyroid medication to see if symptoms resolve.  ?

## 2022-01-14 NOTE — Telephone Encounter (Signed)
Pt called stating she is having problems with rosuvastatin and would like to be called ?

## 2022-01-16 NOTE — Telephone Encounter (Signed)
Lab has been placed in results folder.  ?

## 2022-01-27 ENCOUNTER — Emergency Department
Admission: EM | Admit: 2022-01-27 | Discharge: 2022-01-27 | Disposition: A | Discharge status: Home / Self Care | Payer: Medicare Other | Attending: Emergency Medicine | Admitting: Emergency Medicine

## 2022-01-27 ENCOUNTER — Other Ambulatory Visit: Payer: Self-pay

## 2022-01-27 ENCOUNTER — Emergency Department: Payer: Medicare Other

## 2022-01-27 ENCOUNTER — Encounter: Payer: Self-pay | Admitting: Emergency Medicine

## 2022-01-27 DIAGNOSIS — X509XXA Other and unspecified overexertion or strenuous movements or postures, initial encounter: Secondary | ICD-10-CM | POA: Diagnosis not present

## 2022-01-27 DIAGNOSIS — S4992XA Unspecified injury of left shoulder and upper arm, initial encounter: Secondary | ICD-10-CM | POA: Diagnosis not present

## 2022-01-27 DIAGNOSIS — M47814 Spondylosis without myelopathy or radiculopathy, thoracic region: Secondary | ICD-10-CM | POA: Diagnosis not present

## 2022-01-27 DIAGNOSIS — S46912A Strain of unspecified muscle, fascia and tendon at shoulder and upper arm level, left arm, initial encounter: Secondary | ICD-10-CM | POA: Insufficient documentation

## 2022-01-27 MED ORDER — TRAMADOL HCL 50 MG PO TABS: 50.0000 mg | ORAL_TABLET | Freq: Four times a day (QID) | ORAL | 0 refills | Status: DC | PRN

## 2022-01-27 NOTE — ED Triage Notes (Signed)
Pt reports was putting a flag up this am and twisted wrong and felt a pop in her left shoulder and now not able to lift left arm

## 2022-01-27 NOTE — ED Provider Notes (Signed)
   [facility] Provider Note    Event Date/Time   First MD Initiated Contact with Patient 01/27/22 1247     (approximate)   History   Shoulder Injury   HPI  Kimberly Rojas is a 76 y.o. female who presents with complaints of left shoulder pain.  Patient reports that she was going to place a flag in a flag holder this morning but the flag slipped so she had to suddenly move her arm to catch it.  She felt a popping in her left shoulder and now she has pain when abducting her left arm.  No other injuries reported     Physical Exam   Triage Vital Signs: ED Triage Vitals  Enc Vitals Group     BP 01/27/22 1343 120/80     Pulse Rate 01/27/22 1343 78     Resp 01/27/22 1343 17     Temp 01/27/22 1343 98 F (36.7 C)     Temp Source 01/27/22 1343 Oral     SpO2 01/27/22 1343 98 %     Weight 01/27/22 1230 57 kg (125 lb 10.6 oz)     Height 01/27/22 1230 1.524 m (5')     Head Circumference --      Peak Flow --      Pain Score 01/27/22 1230 10     Pain Loc --      Pain Edu? --      Excl. in GC? --     Most recent vital signs: Vitals:   01/27/22 1343  BP: 120/80  Pulse: 78  Resp: 17  Temp: 98 F (36.7 C)  SpO2: 98%     General: Awake, no distress.  CV:  Good peripheral perfusion.  Resp:  Normal effort.  Abd:  No distention.  Other:  Patient has pain with abduction of the left arm, mild tenderness in the posterior shoulder, no bony normalities palpated   ED Results / Procedures / Treatments   Labs (all labs ordered are listed, but only abnormal results are displayed) Labs Reviewed - No data to display   EKG     RADIOLOGY Shoulder x-ray viewed interpreted by me, no fracture    PROCEDURES:  Critical Care performed:   Procedures   MEDICATIONS ORDERED IN ED: Medications - No data to display   IMPRESSION / MDM / ASSESSMENT AND PLAN / ED COURSE  I reviewed the triage vital signs and the nursing notes. Patient's presentation is  most consistent with acute, uncomplicated illness.  Patient presents with left shoulder pain as above, differential includes fracture, strain, tear  X-ray obtained is reassuring, no fracture  Exam is most consistent with a rotator cuff injury, strain versus tear.  We will treat the patient with analgesics, tramadol, outpatient follow-up with Ortho for MRI as needed if no improvement        FINAL CLINICAL IMPRESSION(S) / ED DIAGNOSES   Final diagnoses:  Shoulder strain, left, initial encounter     Rx / DC Orders   ED Discharge Orders          Ordered    traMADol (ULTRAM) 50 MG tablet  Every 6 hours PRN        01/27/22 1322             Note:  This document was prepared using Dragon voice recognition software and may include unintentional dictation errors.   Jene Every, MD 01/27/22 [phone]

## 2022-01-31 HISTORY — PX: EYE SURGERY: SHX253

## 2022-02-01 DIAGNOSIS — M7582 Other shoulder lesions, left shoulder: Secondary | ICD-10-CM | POA: Diagnosis not present

## 2022-02-01 DIAGNOSIS — S46012A Strain of muscle(s) and tendon(s) of the rotator cuff of left shoulder, initial encounter: Secondary | ICD-10-CM | POA: Diagnosis not present

## 2022-02-04 ENCOUNTER — Other Ambulatory Visit: Payer: Self-pay | Admitting: Student

## 2022-02-04 DIAGNOSIS — M7582 Other shoulder lesions, left shoulder: Secondary | ICD-10-CM

## 2022-02-05 DIAGNOSIS — H00012 Hordeolum externum right lower eyelid: Secondary | ICD-10-CM | POA: Diagnosis not present

## 2022-02-05 DIAGNOSIS — L03213 Periorbital cellulitis: Secondary | ICD-10-CM | POA: Diagnosis not present

## 2022-02-08 ENCOUNTER — Ambulatory Visit
Admission: RE | Admit: 2022-02-08 | Discharge: 2022-02-08 | Disposition: A | Discharge status: Home / Self Care | Payer: Medicare Other | Source: Ambulatory Visit | Attending: Student | Admitting: Student

## 2022-02-08 DIAGNOSIS — S46012A Strain of muscle(s) and tendon(s) of the rotator cuff of left shoulder, initial encounter: Secondary | ICD-10-CM | POA: Diagnosis not present

## 2022-02-08 DIAGNOSIS — M7582 Other shoulder lesions, left shoulder: Secondary | ICD-10-CM

## 2022-02-12 DIAGNOSIS — H0012 Chalazion right lower eyelid: Secondary | ICD-10-CM | POA: Diagnosis not present

## 2022-02-15 DIAGNOSIS — M75122 Complete rotator cuff tear or rupture of left shoulder, not specified as traumatic: Secondary | ICD-10-CM | POA: Diagnosis not present

## 2022-02-15 DIAGNOSIS — M7582 Other shoulder lesions, left shoulder: Secondary | ICD-10-CM | POA: Diagnosis not present

## 2022-02-15 DIAGNOSIS — S46102A Unspecified injury of muscle, fascia and tendon of long head of biceps, left arm, initial encounter: Secondary | ICD-10-CM | POA: Diagnosis not present

## 2022-02-15 DIAGNOSIS — M12812 Other specific arthropathies, not elsewhere classified, left shoulder: Secondary | ICD-10-CM | POA: Diagnosis not present

## 2022-02-27 ENCOUNTER — Other Ambulatory Visit: Payer: Self-pay | Admitting: Surgery

## 2022-03-01 DIAGNOSIS — M48062 Spinal stenosis, lumbar region with neurogenic claudication: Secondary | ICD-10-CM | POA: Diagnosis not present

## 2022-03-01 DIAGNOSIS — M5416 Radiculopathy, lumbar region: Secondary | ICD-10-CM | POA: Diagnosis not present

## 2022-03-01 DIAGNOSIS — M4316 Spondylolisthesis, lumbar region: Secondary | ICD-10-CM | POA: Diagnosis not present

## 2022-03-01 DIAGNOSIS — M545 Low back pain, unspecified: Secondary | ICD-10-CM | POA: Diagnosis not present

## 2022-03-01 DIAGNOSIS — M5136 Other intervertebral disc degeneration, lumbar region: Secondary | ICD-10-CM | POA: Diagnosis not present

## 2022-03-15 ENCOUNTER — Encounter
Admission: RE | Admit: 2022-03-15 | Discharge: 2022-03-15 | Disposition: A | Discharge status: Home / Self Care | Payer: Medicare Other | Source: Ambulatory Visit | Attending: Surgery | Admitting: Surgery

## 2022-03-15 VITALS — BP 150/66 | HR 75 | Temp 98.0°F | Resp 20 | Ht 59.0 in | Wt 124.8 lb

## 2022-03-15 DIAGNOSIS — Z01810 Encounter for preprocedural cardiovascular examination: Secondary | ICD-10-CM | POA: Diagnosis not present

## 2022-03-15 DIAGNOSIS — M5136 Other intervertebral disc degeneration, lumbar region: Secondary | ICD-10-CM | POA: Diagnosis not present

## 2022-03-15 DIAGNOSIS — M5416 Radiculopathy, lumbar region: Secondary | ICD-10-CM | POA: Diagnosis not present

## 2022-03-15 DIAGNOSIS — Z01818 Encounter for other preprocedural examination: Secondary | ICD-10-CM | POA: Diagnosis not present

## 2022-03-15 DIAGNOSIS — M48062 Spinal stenosis, lumbar region with neurogenic claudication: Secondary | ICD-10-CM | POA: Diagnosis not present

## 2022-03-15 DIAGNOSIS — Z01812 Encounter for preprocedural laboratory examination: Secondary | ICD-10-CM

## 2022-03-15 LAB — CBC WITH DIFFERENTIAL/PLATELET
Abs Immature Granulocytes: 0.03 10*3/uL (ref 0.00–0.07)
Basophils Absolute: 0 10*3/uL (ref 0.0–0.1)
Basophils Relative: 0 %
Eosinophils Absolute: 0.2 10*3/uL (ref 0.0–0.5)
Eosinophils Relative: 3 %
HCT: 38 % (ref 36.0–46.0)
Hemoglobin: 12.6 g/dL (ref 12.0–15.0)
Immature Granulocytes: 0 %
Lymphocytes Relative: 11 %
Lymphs Abs: 1 10*3/uL (ref 0.7–4.0)
MCH: 29.4 pg (ref 26.0–34.0)
MCHC: 33.2 g/dL (ref 30.0–36.0)
MCV: 88.6 fL (ref 80.0–100.0)
Monocytes Absolute: 0.8 10*3/uL (ref 0.1–1.0)
Monocytes Relative: 9 %
Neutro Abs: 7.1 10*3/uL (ref 1.7–7.7)
Neutrophils Relative %: 77 %
Platelets: 238 10*3/uL (ref 150–400)
RBC: 4.29 MIL/uL (ref 3.87–5.11)
RDW: 13.2 % (ref 11.5–15.5)
WBC: 9.2 10*3/uL (ref 4.0–10.5)
nRBC: 0 % (ref 0.0–0.2)

## 2022-03-15 LAB — COMPREHENSIVE METABOLIC PANEL
ALT: 23 U/L (ref 0–44)
AST: 27 U/L (ref 15–41)
Albumin: 3.7 g/dL (ref 3.5–5.0)
Alkaline Phosphatase: 86 U/L (ref 38–126)
Anion gap: 5 (ref 5–15)
BUN: 13 mg/dL (ref 8–23)
CO2: 28 mmol/L (ref 22–32)
Calcium: 9 mg/dL (ref 8.9–10.3)
Chloride: 105 mmol/L (ref 98–111)
Creatinine, Ser: 0.69 mg/dL (ref 0.44–1.00)
GFR, Estimated: >60 mL/min (ref >60)
Glucose, Bld: 119 mg/dL — ABNORMAL HIGH (ref 70–99)
Potassium: 3.7 mmol/L (ref 3.5–5.1)
Sodium: 138 mmol/L (ref 135–145)
Total Bilirubin: 0.6 mg/dL (ref 0.3–1.2)
Total Protein: 6.9 g/dL (ref 6.5–8.1)

## 2022-03-15 LAB — URINALYSIS, ROUTINE W REFLEX MICROSCOPIC
Bacteria, UA: NONE SEEN
Bilirubin Urine: NEGATIVE
Glucose, UA: NEGATIVE mg/dL
Ketones, ur: NEGATIVE mg/dL
Leukocytes,Ua: NEGATIVE
Nitrite: NEGATIVE
Protein, ur: NEGATIVE mg/dL
Specific Gravity, Urine: 1.012 (ref 1.005–1.030)
pH: 5 (ref 5.0–8.0)

## 2022-03-15 LAB — TYPE AND SCREEN
ABO/RH(D): O POS
Antibody Screen: NEGATIVE

## 2022-03-15 LAB — SURGICAL PCR SCREEN
MRSA, PCR: NEGATIVE
Staphylococcus aureus: NEGATIVE

## 2022-03-15 NOTE — Patient Instructions (Addendum)
Your procedure is scheduled on: Tuesday March 26, 2022. Report to Day Surgery inside [facility] 2nd floor, stop by admissions desk before getting on elevator. To find out your arrival time please call [phone] between 1PM - 3PM on Monday March 25, 2022.  Remember: Instructions that are not followed completely may result in serious medical risk,  up to and including death, or upon the discretion of your surgeon and anesthesiologist your  surgery may need to be rescheduled.     _X__ 1. Do not eat food after midnight the night before your procedure.                 No chewing gum or hard candies. You may drink clear liquids up to 2 hours                 before you are scheduled to arrive for your surgery- DO not drink clear                 liquids within 2 hours of the start of your surgery.                 Clear Liquids include:  water, apple juice without pulp, clear Gatorade, G2 or                  Gatorade Zero (avoid Red/Purple/Blue), Black Coffee or Tea (Do not add                 anything to coffee or tea).  __X__2.   Complete the "Ensure Clear Pre-surgery Clear Carbohydrate Drink" provided to you, 2 hours before arrival. **If you are diabetic you will be provided with an alternative drink, Gatorade Zero or G2.  __X__3.  On the morning of surgery brush your teeth with toothpaste and water, you                may rinse your mouth with mouthwash if you wish.  Do not swallow any toothpaste of mouthwash.     _X__ 4.  No Alcohol for 24 hours before or after surgery.   _X__ 5.  Do Not Smoke or use e-cigarettes For 24 Hours Prior to Your Surgery.                 Do not use any chewable tobacco products for at least 6 hours prior to                 Surgery.  _X__  6.  Do not use any recreational drugs (marijuana, cocaine, heroin, ecstasy, MDMA or other)                For at least one week prior to your surgery.  Combination of these drugs with anesthesia                 May have life threatening results.  ____  7.  Bring all medications with you on the day of surgery if instructed.   __X__ 8.  Notify your doctor if there is any change in your medical condition      (cold, fever, infections).     Do not wear jewelry, make-up, hairpins, clips or nail polish. Do not wear lotions, powders, or perfumes. You may wear deodorant. Do not shave 48 hours prior to surgery. Men may shave face and neck. Do not bring valuables to the hospital.    [facility] is not responsible for any belongings or valuables.  Contacts,  dentures or bridgework may not be worn into surgery. Leave your suitcase in the car. After surgery it may be brought to your room. For patients admitted to the hospital, discharge time is determined by your treatment team.   Patients discharged the day of surgery will not be allowed to drive home.   Make arrangements for someone to be with you for the first 24 hours of your Same Day Discharge.   __X__ Take these medicines the morning of surgery with A SIP OF WATER:    1. busPIRone (BUSPAR) 7.5 MG   2.   3.   4.  5.  6.  ____ Fleet Enema (as directed)   __X__ Use CHG Soap (or wipes) as directed  __X__ Use Benzoyl Peroxide Gel as instructed  ____ Use inhalers on the day of surgery  ____ Stop metformin 2 days prior to surgery    ____ Take 1/2 of usual insulin dose the night before surgery. No insulin the morning          of surgery.   ____ Call your PCP, cardiologist, or Pulmonologist if taking Coumadin/Plavix/aspirin and ask when to stop before your surgery.   __X__ One Week prior to surgery- Stop Anti-inflammatories such as Ibuprofen, Aleve, Advil, Motrin, meloxicam (MOBIC), diclofenac, etodolac, ketorolac, Toradol, Daypro, piroxicam, Goody's or BC powders. OK TO USE TYLENOL IF NEEDED   __X__ Stop supplements until after surgery.    ____ Bring C-Pap to the hospital.    If you have any questions regarding your  pre-procedure instructions,  Please call Pre-admit Testing at [phone]

## 2022-03-18 DIAGNOSIS — M25462 Effusion, left knee: Secondary | ICD-10-CM | POA: Diagnosis not present

## 2022-03-18 DIAGNOSIS — M12812 Other specific arthropathies, not elsewhere classified, left shoulder: Secondary | ICD-10-CM | POA: Diagnosis not present

## 2022-03-18 DIAGNOSIS — M1712 Unilateral primary osteoarthritis, left knee: Secondary | ICD-10-CM | POA: Diagnosis not present

## 2022-03-20 ENCOUNTER — Telehealth: Payer: Self-pay

## 2022-03-20 ENCOUNTER — Other Ambulatory Visit: Payer: Self-pay | Admitting: Internal Medicine

## 2022-03-20 NOTE — Telephone Encounter (Signed)
EKG was done on 03/15/2022 and is in Epic.

## 2022-03-20 NOTE — Telephone Encounter (Signed)
Patient states she found out she had an abnormal ECG and she is concerned about it since she is scheduled to have rotator cuff surgery next Tuesday.  Patient states she has already contacted the surgeon's office and is waiting for a call back.  Patient states she is wondering what she should do.

## 2022-03-21 NOTE — Telephone Encounter (Signed)
Spoke with Patient to give her Dr. Melina Schools message and patient said thank you. The Patient has not heard from Dr. Binnie Rail office so she said everything must be ok. She asked about her medication so I looked and we had sent the refill in.

## 2022-03-26 ENCOUNTER — Ambulatory Visit: Payer: Medicare Other | Admitting: General Practice

## 2022-03-26 ENCOUNTER — Other Ambulatory Visit: Payer: Self-pay

## 2022-03-26 ENCOUNTER — Encounter
Admission: RE | Disposition: A | Discharge status: Home / Self Care | Payer: Self-pay | Source: Home / Self Care | Attending: Surgery

## 2022-03-26 ENCOUNTER — Ambulatory Visit: Payer: Medicare Other

## 2022-03-26 ENCOUNTER — Encounter: Payer: Self-pay | Admitting: Surgery

## 2022-03-26 ENCOUNTER — Ambulatory Visit
Admission: RE | Admit: 2022-03-26 | Discharge: 2022-03-26 | Disposition: A | Discharge status: Home / Self Care | Payer: Medicare Other | Attending: Surgery | Admitting: Surgery

## 2022-03-26 DIAGNOSIS — M75101 Unspecified rotator cuff tear or rupture of right shoulder, not specified as traumatic: Secondary | ICD-10-CM | POA: Diagnosis not present

## 2022-03-26 DIAGNOSIS — Z96642 Presence of left artificial hip joint: Secondary | ICD-10-CM | POA: Diagnosis not present

## 2022-03-26 DIAGNOSIS — G8918 Other acute postprocedural pain: Secondary | ICD-10-CM | POA: Diagnosis not present

## 2022-03-26 DIAGNOSIS — M7582 Other shoulder lesions, left shoulder: Secondary | ICD-10-CM | POA: Diagnosis not present

## 2022-03-26 DIAGNOSIS — E785 Hyperlipidemia, unspecified: Secondary | ICD-10-CM | POA: Insufficient documentation

## 2022-03-26 DIAGNOSIS — S46102A Unspecified injury of muscle, fascia and tendon of long head of biceps, left arm, initial encounter: Secondary | ICD-10-CM | POA: Diagnosis not present

## 2022-03-26 DIAGNOSIS — M19012 Primary osteoarthritis, left shoulder: Secondary | ICD-10-CM | POA: Insufficient documentation

## 2022-03-26 DIAGNOSIS — M75122 Complete rotator cuff tear or rupture of left shoulder, not specified as traumatic: Secondary | ICD-10-CM | POA: Diagnosis not present

## 2022-03-26 DIAGNOSIS — Z96612 Presence of left artificial shoulder joint: Secondary | ICD-10-CM | POA: Diagnosis not present

## 2022-03-26 DIAGNOSIS — Z471 Aftercare following joint replacement surgery: Secondary | ICD-10-CM | POA: Diagnosis not present

## 2022-03-26 DIAGNOSIS — M12812 Other specific arthropathies, not elsewhere classified, left shoulder: Secondary | ICD-10-CM | POA: Diagnosis not present

## 2022-03-26 HISTORY — PX: REVERSE SHOULDER ARTHROPLASTY: SHX5054

## 2022-03-26 LAB — ABO/RH: ABO/RH(D): O POS

## 2022-03-26 SURGERY — ARTHROPLASTY, SHOULDER, TOTAL, REVERSE
Anesthesia: General | Site: Shoulder | Laterality: Left

## 2022-03-26 MED ORDER — FENTANYL CITRATE (PF) 100 MCG/2ML IJ SOLN
INTRAMUSCULAR | Status: DC | PRN
  Administered 2022-03-26 (×2): 25 ug via INTRAVENOUS
  Administered 2022-03-26: 50 ug via INTRAVENOUS

## 2022-03-26 MED ORDER — LIDOCAINE HCL (PF) 2 % IJ SOLN
INTRAMUSCULAR | Status: AC
  Filled 2022-03-26: qty 5

## 2022-03-26 MED ORDER — CEFAZOLIN SODIUM-DEXTROSE 2-4 GM/100ML-% IV SOLN
INTRAVENOUS | Status: AC
  Administered 2022-03-26: 2 g via INTRAVENOUS
  Filled 2022-03-26: qty 100

## 2022-03-26 MED ORDER — BUPIVACAINE HCL (PF) 0.5 % IJ SOLN
INTRAMUSCULAR | Status: AC
  Filled 2022-03-26: qty 10

## 2022-03-26 MED ORDER — PHENYLEPHRINE 80 MCG/ML (10ML) SYRINGE FOR IV PUSH (FOR BLOOD PRESSURE SUPPORT)
PREFILLED_SYRINGE | INTRAVENOUS | Status: DC | PRN
  Administered 2022-03-26: 80 ug via INTRAVENOUS
  Administered 2022-03-26: 160 ug via INTRAVENOUS

## 2022-03-26 MED ORDER — ACETAMINOPHEN 500 MG PO TABS: 500.0000 mg | ORAL_TABLET | Freq: Four times a day (QID) | ORAL | Status: DC

## 2022-03-26 MED ORDER — ONDANSETRON HCL 4 MG/2ML IJ SOLN: 4.0000 mg | Freq: Four times a day (QID) | INTRAMUSCULAR | Status: DC | PRN

## 2022-03-26 MED ORDER — METOCLOPRAMIDE HCL 10 MG PO TABS: 5.0000 mg | ORAL_TABLET | Freq: Three times a day (TID) | ORAL | Status: DC | PRN

## 2022-03-26 MED ORDER — FAMOTIDINE 20 MG PO TABS
ORAL_TABLET | ORAL | Status: AC
  Administered 2022-03-26: 20 mg via ORAL
  Filled 2022-03-26: qty 1

## 2022-03-26 MED ORDER — ONDANSETRON HCL 4 MG/2ML IJ SOLN
INTRAMUSCULAR | Status: AC
  Filled 2022-03-26: qty 2

## 2022-03-26 MED ORDER — BUPIVACAINE LIPOSOME 1.3 % IJ SUSP
INTRAMUSCULAR | Status: AC
  Filled 2022-03-26: qty 10

## 2022-03-26 MED ORDER — SODIUM CHLORIDE 0.9 % IR SOLN
Status: DC | PRN
  Administered 2022-03-26: 3000 mL

## 2022-03-26 MED ORDER — TRANEXAMIC ACID 1000 MG/10ML IV SOLN
INTRAVENOUS | Status: AC
  Filled 2022-03-26: qty 10

## 2022-03-26 MED ORDER — TRANEXAMIC ACID 1000 MG/10ML IV SOLN
INTRAVENOUS | Status: DC | PRN
  Administered 2022-03-26: 1000 mg via TOPICAL

## 2022-03-26 MED ORDER — HYDROCODONE-ACETAMINOPHEN 5-325 MG PO TABS: 1.0000 | ORAL_TABLET | ORAL | Status: DC | PRN

## 2022-03-26 MED ORDER — BUPIVACAINE-EPINEPHRINE 0.5% -1:200000 IJ SOLN
INTRAMUSCULAR | Status: DC | PRN
  Administered 2022-03-26: 30 mL via INTRAMUSCULAR

## 2022-03-26 MED ORDER — LACTATED RINGERS IV SOLN: INTRAVENOUS | Status: DC

## 2022-03-26 MED ORDER — MIDAZOLAM HCL 2 MG/2ML IJ SOLN
INTRAMUSCULAR | Status: AC
  Filled 2022-03-26: qty 2

## 2022-03-26 MED ORDER — DEXAMETHASONE SODIUM PHOSPHATE 10 MG/ML IJ SOLN
INTRAMUSCULAR | Status: AC
  Filled 2022-03-26: qty 1

## 2022-03-26 MED ORDER — HYDROCODONE-ACETAMINOPHEN 5-325 MG PO TABS: 1.0000 | ORAL_TABLET | Freq: Four times a day (QID) | ORAL | 0 refills | Status: DC | PRN

## 2022-03-26 MED ORDER — FENTANYL CITRATE (PF) 100 MCG/2ML IJ SOLN: 50.0000 ug | Freq: Once | INTRAMUSCULAR | Status: DC

## 2022-03-26 MED ORDER — OXYCODONE HCL 5 MG/5ML PO SOLN: 5.0000 mg | Freq: Once | ORAL | Status: DC | PRN

## 2022-03-26 MED ORDER — CEFAZOLIN SODIUM-DEXTROSE 2-4 GM/100ML-% IV SOLN
2.0000 g | INTRAVENOUS | Status: AC
  Administered 2022-03-26: 2 g via INTRAVENOUS

## 2022-03-26 MED ORDER — SUGAMMADEX SODIUM 200 MG/2ML IV SOLN
INTRAVENOUS | Status: DC | PRN
  Administered 2022-03-26: 150 mg via INTRAVENOUS

## 2022-03-26 MED ORDER — ROCURONIUM BROMIDE 100 MG/10ML IV SOLN
INTRAVENOUS | Status: DC | PRN
  Administered 2022-03-26: 50 mg via INTRAVENOUS
  Administered 2022-03-26: 10 mg via INTRAVENOUS

## 2022-03-26 MED ORDER — LIDOCAINE HCL (CARDIAC) PF 100 MG/5ML IV SOSY
PREFILLED_SYRINGE | INTRAVENOUS | Status: DC | PRN
  Administered 2022-03-26: 60 mg via INTRAVENOUS

## 2022-03-26 MED ORDER — BUPIVACAINE-EPINEPHRINE (PF) 0.5% -1:200000 IJ SOLN
INTRAMUSCULAR | Status: AC
  Filled 2022-03-26: qty 30

## 2022-03-26 MED ORDER — DEXAMETHASONE SODIUM PHOSPHATE 10 MG/ML IJ SOLN
INTRAMUSCULAR | Status: DC | PRN
  Administered 2022-03-26: 10 mg via INTRAVENOUS

## 2022-03-26 MED ORDER — 0.9 % SODIUM CHLORIDE (POUR BTL) OPTIME
TOPICAL | Status: DC | PRN
  Administered 2022-03-26: 1000 mL

## 2022-03-26 MED ORDER — CEFAZOLIN SODIUM-DEXTROSE 2-4 GM/100ML-% IV SOLN: 2.0000 g | Freq: Four times a day (QID) | INTRAVENOUS | Status: DC

## 2022-03-26 MED ORDER — FENTANYL CITRATE (PF) 100 MCG/2ML IJ SOLN: 25.0000 ug | INTRAMUSCULAR | Status: DC | PRN

## 2022-03-26 MED ORDER — PROPOFOL 10 MG/ML IV BOLUS
INTRAVENOUS | Status: DC | PRN
  Administered 2022-03-26: 30 mg via INTRAVENOUS
  Administered 2022-03-26: 130 mg via INTRAVENOUS

## 2022-03-26 MED ORDER — CEFAZOLIN SODIUM-DEXTROSE 2-4 GM/100ML-% IV SOLN
INTRAVENOUS | Status: DC
Start: 2022-03-26 — End: 2022-03-26
  Filled 2022-03-26: qty 100

## 2022-03-26 MED ORDER — CHLORHEXIDINE GLUCONATE 0.12 % MT SOLN: 15.0000 mL | Freq: Once | OROMUCOSAL | Status: AC

## 2022-03-26 MED ORDER — KETOROLAC TROMETHAMINE 15 MG/ML IJ SOLN
INTRAMUSCULAR | Status: AC
  Filled 2022-03-26: qty 1

## 2022-03-26 MED ORDER — ORAL CARE MOUTH RINSE: 15.0000 mL | Freq: Once | OROMUCOSAL | Status: AC

## 2022-03-26 MED ORDER — ROCURONIUM BROMIDE 10 MG/ML (PF) SYRINGE
PREFILLED_SYRINGE | INTRAVENOUS | Status: AC
Start: 2022-03-26 — End: ?
  Filled 2022-03-26: qty 10

## 2022-03-26 MED ORDER — MIDAZOLAM HCL 2 MG/2ML IJ SOLN
INTRAMUSCULAR | Status: DC | PRN
  Administered 2022-03-26: .5 mg via INTRAVENOUS

## 2022-03-26 MED ORDER — FAMOTIDINE 20 MG PO TABS: 20.0000 mg | ORAL_TABLET | Freq: Once | ORAL | Status: AC

## 2022-03-26 MED ORDER — EPHEDRINE SULFATE (PRESSORS) 50 MG/ML IJ SOLN
INTRAMUSCULAR | Status: DC | PRN
  Administered 2022-03-26: 5 mg via INTRAVENOUS

## 2022-03-26 MED ORDER — KETOROLAC TROMETHAMINE 15 MG/ML IJ SOLN
15.0000 mg | Freq: Once | INTRAMUSCULAR | Status: AC
  Administered 2022-03-26: 15 mg via INTRAVENOUS

## 2022-03-26 MED ORDER — OXYCODONE HCL 5 MG PO TABS: 5.0000 mg | ORAL_TABLET | Freq: Once | ORAL | Status: DC | PRN

## 2022-03-26 MED ORDER — CHLORHEXIDINE GLUCONATE 0.12 % MT SOLN
OROMUCOSAL | Status: AC
  Administered 2022-03-26: 15 mL via OROMUCOSAL
  Filled 2022-03-26: qty 15

## 2022-03-26 MED ORDER — PHENYLEPHRINE HCL-NACL 20-0.9 MG/250ML-% IV SOLN
INTRAVENOUS | Status: DC | PRN
  Administered 2022-03-26: 25 ug/min via INTRAVENOUS

## 2022-03-26 MED ORDER — FENTANYL CITRATE PF 50 MCG/ML IJ SOSY
PREFILLED_SYRINGE | INTRAMUSCULAR | Status: AC
  Administered 2022-03-26: 50 ug
  Filled 2022-03-26: qty 1

## 2022-03-26 MED ORDER — SODIUM CHLORIDE 0.9 % IV SOLN: INTRAVENOUS | Status: DC

## 2022-03-26 MED ORDER — ONDANSETRON HCL 4 MG/2ML IJ SOLN
INTRAMUSCULAR | Status: DC | PRN
  Administered 2022-03-26: 4 mg via INTRAVENOUS

## 2022-03-26 MED ORDER — ONDANSETRON HCL 4 MG PO TABS: 4.0000 mg | ORAL_TABLET | Freq: Four times a day (QID) | ORAL | Status: DC | PRN

## 2022-03-26 MED ORDER — FENTANYL CITRATE (PF) 100 MCG/2ML IJ SOLN
INTRAMUSCULAR | Status: AC
  Filled 2022-03-26: qty 2

## 2022-03-26 MED ORDER — METOCLOPRAMIDE HCL 5 MG/ML IJ SOLN: 5.0000 mg | Freq: Three times a day (TID) | INTRAMUSCULAR | Status: DC | PRN

## 2022-03-26 SURGICAL SUPPLY — 67 items
BASEPLATE SHLD REV 20 POST (Shoulder) ×1 IMPLANT
BEARING HUMERAL SHLDER 36M STD (Shoulder) IMPLANT
BIT DRILL CANN 6X20 (BIT) ×1 IMPLANT
BIT DRILL QUICK CONN 2.5 (BIT) ×1 IMPLANT
BLADE REAMER 1 REV SHOULDER (BLADE) ×1 IMPLANT
BLADE SAW SAG 25X90X1.19 (BLADE) ×2 IMPLANT
CHLORAPREP W/TINT 26 (MISCELLANEOUS) ×2 IMPLANT
COOLER POLAR GLACIER W/PUMP (MISCELLANEOUS) ×2 IMPLANT
COVER BACK TABLE REUSABLE LG (DRAPES) ×2 IMPLANT
DRAPE 3/4 80X56 (DRAPES) ×2 IMPLANT
DRAPE INCISE IOBAN 66X45 STRL (DRAPES) ×2 IMPLANT
DRSG OPSITE POSTOP 4X8 (GAUZE/BANDAGES/DRESSINGS) ×2 IMPLANT
ELECT BLADE 6.5 EXT (BLADE) IMPLANT
ELECT CAUTERY BLADE 6.4 (BLADE) ×2 IMPLANT
ELECT REM PT RETURN 9FT ADLT (ELECTROSURGICAL) ×2
ELECTRODE REM PT RTRN 9FT ADLT (ELECTROSURGICAL) ×1 IMPLANT
GAUZE XEROFORM 1X8 LF (GAUZE/BANDAGES/DRESSINGS) ×2 IMPLANT
GLENOSPERE TM SHLD REV 3 +0 (Shoulder) ×2 IMPLANT
GLENOSPHERE TM SHLD REV 3 +0 (Shoulder) IMPLANT
GLOVE BIO SURGEON STRL SZ7.5 (GLOVE) ×8 IMPLANT
GLOVE BIO SURGEON STRL SZ8 (GLOVE) ×8 IMPLANT
GLOVE BIOGEL PI IND STRL 8 (GLOVE) ×2 IMPLANT
GLOVE BIOGEL PI INDICATOR 8 (GLOVE) ×2
GLOVE SURG UNDER LTX SZ8 (GLOVE) ×2 IMPLANT
GOWN STRL REUS W/ TWL LRG LVL3 (GOWN DISPOSABLE) ×1 IMPLANT
GOWN STRL REUS W/ TWL XL LVL3 (GOWN DISPOSABLE) ×1 IMPLANT
GOWN STRL REUS W/TWL LRG LVL3 (GOWN DISPOSABLE) ×1
GOWN STRL REUS W/TWL XL LVL3 (GOWN DISPOSABLE) ×1
HOOD PEEL AWAY FLYTE STAYCOOL (MISCELLANEOUS) ×6 IMPLANT
IV NS IRRIG 3000ML ARTHROMATIC (IV SOLUTION) ×2 IMPLANT
KIT STABILIZATION SHOULDER (MISCELLANEOUS) ×2 IMPLANT
KIT TURNOVER KIT A (KITS) ×2 IMPLANT
MANIFOLD NEPTUNE II (INSTRUMENTS) ×2 IMPLANT
MASK FACE SPIDER DISP (MASK) ×2 IMPLANT
MAT ABSORB  FLUID 56X50 GRAY (MISCELLANEOUS) ×1
MAT ABSORB FLUID 56X50 GRAY (MISCELLANEOUS) ×1 IMPLANT
NDL MAYO CATGUT SZ1 (NEEDLE) IMPLANT
NDL SAFETY ECLIPSE 18X1.5 (NEEDLE) ×1 IMPLANT
NDL SPNL 20GX3.5 QUINCKE YW (NEEDLE) ×1 IMPLANT
NEEDLE HYPO 18GX1.5 SHARP (NEEDLE) ×1
NEEDLE MAYO CATGUT SZ1 (NEEDLE) IMPLANT
NEEDLE SPNL 20GX3.5 QUINCKE YW (NEEDLE) ×2 IMPLANT
NS IRRIG 500ML POUR BTL (IV SOLUTION) ×2 IMPLANT
PACK ARTHROSCOPY SHOULDER (MISCELLANEOUS) ×2 IMPLANT
PAD ARMBOARD 7.5X6 YLW CONV (MISCELLANEOUS) ×2 IMPLANT
PAD WRAPON POLAR SHDR UNIV (MISCELLANEOUS) ×1 IMPLANT
PIN BONE FIXATION TEMP 2.5 (PIN) ×1 IMPLANT
PULSAVAC PLUS IRRIG FAN TIP (DISPOSABLE) ×2
SCREW LOCK SHLD REV 4.5X30 (Screw) ×1 IMPLANT
SCREW REVERSE 4.5-42 (Screw) ×1 IMPLANT
SHOULDER HUMERAL BEAR 36M STD (Shoulder) ×2 IMPLANT
SLING ULTRA II M (MISCELLANEOUS) IMPLANT
SPONGE T-LAP 18X18 ~~LOC~~+RFID (SPONGE) ×4 IMPLANT
STAPLER SKIN PROX 35W (STAPLE) ×2 IMPLANT
STEM HUMERAL MICRO SZ8 (Stem) ×1 IMPLANT
SUT ETHIBOND 0 MO6 C/R (SUTURE) ×2 IMPLANT
SUT FIBERWIRE #2 38 BLUE 1/2 (SUTURE) ×8
SUT VIC AB 0 CT1 36 (SUTURE) ×2 IMPLANT
SUT VIC AB 2-0 CT1 27 (SUTURE) ×2
SUT VIC AB 2-0 CT1 TAPERPNT 27 (SUTURE) ×2 IMPLANT
SUTURE FIBERWR #2 38 BLUE 1/2 (SUTURE) ×4 IMPLANT
SYR 10ML LL (SYRINGE) ×2 IMPLANT
SYR 30ML LL (SYRINGE) ×2 IMPLANT
TIP FAN IRRIG PULSAVAC PLUS (DISPOSABLE) ×1 IMPLANT
TRAY HUMERAL NEUTRAL EXT 6 (Shoulder) ×1 IMPLANT
WATER STERILE IRR 500ML POUR (IV SOLUTION) ×2 IMPLANT
WRAPON POLAR PAD SHDR UNIV (MISCELLANEOUS) ×2

## 2022-03-26 NOTE — H&P (Signed)
History of Present Illness: Kimberly Rojas is a 76 y.o. female who presents today for her surgical history and physical for upcoming left reverse total shoulder arthroplasty. Surgery is scheduled with Dr. Joice Lofts on 03/26/2022. The patient denies any changes in her medical history since she was last evaluated. She denies any personal history of heart attack, stroke, asthma or COPD. No personal history of blood clots. The patient denies any repeat trauma or injury affecting left shoulder since she was last evaluated. The patient denies any numbness or tingling of the left upper extremity at today's visit.  Past Medical History: Borderline hypothyroidism  Hyperlipidemia  Migraines  Osteoarthritis  Peptic ulcer disease  remote   Past Surgical History: Status post D&C 2003  COLONOSCOPY 04/2011  Internal hemorrhoids, otherwise normal.  Repair of the complete rotator cuff tendon tear, right shoulder utilizing a TissueMend graft and 3 anchors, Open acromionectomy and acromioplasty, right shoulder. 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 Left 05/18/2019 (Dr. Joice Lofts)  APPENDECTOMY  Breast biopsy, left  CHOLECYSTECTOMY   Past Family History: Breast cancer Mother  Aneurysm Mother  Cancer Father  Diabetes Other   Medications: acetaminophen (TYLENOL) 500 MG tablet Take by mouth Take 500 mg by mouth every 6 (six) hours as needed.  artificial tears (GONIOSCOP-HPM) 2.5 % ophthalmic solution Apply to eye Place 1 drop into both eyes 3 (three) times daily as needed for dry eyes  busPIRone (BUSPAR) 7.5 MG tablet Take 1 tablet by mouth as needed  celecoxib (CELEBREX) 100 MG capsule Take 1 capsule (100 mg total) by mouth 2 (two) times daily for 30 days 60 capsule 0  cholecalciferol (VITAMIN D3) 1000 unit tablet Take 2 tablets by mouth once daily   cyanocobalamin (VITAMIN B12) 1000 MCG tablet Take by mouth  levothyroxine (SYNTHROID) 25 MCG tablet TAKE 1 TABLET BY MOUTH ONCE DAILY IN THE MORNING ON AN EMPTY STOMACH  multivitamin tablet Take 1 tablet by mouth once daily.  predniSONE (DELTASONE) 5 MG tablet Take as directed - 6 day taper 21 tablet 0  rosuvastatin (CRESTOR) 10 MG tablet Take by mouth Take 1 tablet (10 mg total) by mouth 2 (two) times a week.  traMADoL (ULTRAM) 50 mg tablet Take 50 mg by mouth every 6 (six) hours as needed   Allergies: Tramadol Shortness Of Breath  Per the patient on 05/28/2019- she has taken Tramadol recently and did not have any SOB.  Advil [Ibuprofen] Other (Gas pains)  Atorvastatin Other (Back pain)  Levothyroxine Other (Night sweats)  Pantoprazole Rash   Review of Systems:  A comprehensive 14 point ROS was performed, reviewed by me today, and the pertinent orthopaedic findings are documented in the HPI.  Physical Exam: BP (!) 140/82  Ht 149.9 cm (4' 11.02")  Wt 57.2 kg (126 lb)  BMI 25.43 kg/m  General/Constitutional: The patient appears to be well-nourished, well-developed, and in no acute distress. Neuro/Psych: Normal mood and affect, oriented to person, place and time. Eyes: Non-icteric. Pupils are equal, round, and reactive to light, and exhibit synchronous movement. ENT: Unremarkable. Lymphatic: No palpable adenopathy. Respiratory: Lungs clear to auscultation, Normal chest excursion, No wheezes, and Non-labored breathing Cardiovascular: Regular rate and rhythm. No murmurs. and No edema, swelling or tenderness, except as noted in detailed exam. Integumentary: No impressive skin lesions present, except as noted in detailed exam. Musculoskeletal: Unremarkable, except as noted in detailed exam.  Left shoulder  exam: SKIN: normal SWELLING: none WARMTH: none LYMPH NODES: no adenopathy palpable CREPITUS: none TENDERNESS: Mildly tender along anterolateral acromion ROM (active):  Forward  flexion: 80 degrees Abduction: 65 degrees Internal rotation: Left PSIS ROM (passive):  Forward flexion: 155 degrees Abduction: 145 degrees ER/IR at 90 abd: 80 degrees / 60 degrees  She has mild pain at the extremes of all motions.  STRENGTH: Forward flexion: 3/5 Abduction: 2/5 External rotation: 4/5 Internal rotation: 3+/5 Pain with RC testing: Yes  STABILITY: Normal  SPECIAL TESTS: Juanetta Gosling' test: positive, mild Speed's test: Not evaluated Capsulitis - pain w/ passive ER: no Crossed arm test: Mildly positive Crank: Not evaluated Anterior apprehension: Negative Posterior apprehension: Not evaluated  She is neurovascularly intact to the left upper extremity.  Imaging: Previous x-rays of the left knee were reviewed at today's visit. These x-rays demonstrate significant osteoarthritic changes primarily involving the lateral compartment of the left knee. There is close to 100% joint space narrowing and osteophyte formation off the lateral tibial plateau. Underlying subchondral sclerosis is also identified.  MRI OF THE LEFT SHOULDER:  1. Large full-thickness tear of the entire AP dimension of the  supraspinatus tendon footprint. Moderate anterior supraspinatus  muscle atrophy.  2. Mild-to-moderate anterior infraspinatus tendinosis with mild  anterior infraspinatus muscle atrophy.  3. Moderate to high-grade partial-thickness tearing of the superior  greater than inferior aspects of the subscapularis tendon with mild  superior subscapularis muscle atrophy.  4. High-grade partial or full-thickness tear of the proximal long  head of the biceps tendon proximal to the bicipital groove.  5. Mild-to-moderate acromioclavicular osteoarthritis. Minimal  downsloping of the anterolateral acromion III acromion.   Impression: Rotator cuff arthropathy of left shoulder.  Plan:  1. Treatment options were discussed today with the patient. 2. The patient is scheduled to undergo a left reverse  total shoulder arthroplasty with Dr. Joice Lofts on 03/26/22. 3. The patient was instructed on the risk and benefits of surgery and wishes to proceed at this time. After discussion of the risk and benefits the patient would like to proceed with surgery at this time. 4. The patient was offered and received a left knee steroid injection with aspiration at today's visit as well, this was performed without any complications. 5. This document will serve as a surgical history and physical for the patient. 6. The patient will follow-up per standard postop protocol. She can call the clinic if she has any questions, new symptoms develop or symptoms worsen.  The procedure was discussed with the patient, as were the potential risks (including bleeding, infection, nerve and/or blood vessel injury, persistent or recurrent pain, failure of the hardware, dislocation, stress fracture, need for further surgery, blood clots, strokes, heart attacks and/or arhythmias, pneumonia, etc.) and benefits. The patient states her understanding and wishes to proceed.   H&P reviewed and patient re-examined. No changes.

## 2022-03-26 NOTE — Op Note (Signed)
03/26/2022  1:17 PM  Patient:   Kimberly Rojas  Pre-Op Diagnosis:   Massive irreparable rotator cuff tear with early cuff arthropathy, right shoulder.  Post-Op Diagnosis:   Same  Procedure:   Reverse right total shoulder arthroplasty.  Surgeon:   Maryagnes Amos, MD  Assistant:   Horris Latino, PA-C; Zenovia Jordan, PA-S  Anesthesia:   General endotracheal with an interscalene block using Exparel placed preoperatively by the anesthesiologist.  Findings:   As above.  Complications:   None  EBL:   75 cc  Fluids:   600 cc crystalloid  UOP:   None  TT:   None  Drains:   None  Closure:   Staples  Implants:   All press-fit Zimmer[employer] reverse total shoulder system with an 8 mm identity micro-humeral stem, a -6 mm extended neutral identity humeral tray with a +0 mm insert, and a TMR+ mini-base plate with a 20 mm post and a concentric +0 mm offset 36 mm glenosphere.  Brief Clinical Note:   The patient is a 76 year old female with a long history of progressively worsening pain and weakness of his right shoulder. Her symptoms have progressed despite medications, activity modification, etc. Her history and examination consistent with a massive irreparable rotator cuff tear with cuff arthropathy, all of which were confirmed by MRI scan preoperatively. The patient presents at this time for a reverse right total shoulder arthroplasty.  Procedure:   The patient underwent placement of an interscalene block using Exparel by the anesthesiologist in the preoperative holding area before being brought into the operating room and lain in the supine position. The patient then underwent general endotracheal intubation and anesthesia before the patient was repositioned in the beach chair position using the beach chair positioner. The left shoulder and upper extremity were prepped with ChloraPrep solution before being draped sterilely. Preoperative antibiotics were administered. A timeout was performed  to verify the appropriate surgical site.    A standard anterior approach to the shoulder was made through an approximately 4-5 inch incision. The incision was carried down through the subcutaneous tissues to expose the deltopectoral fascia. The interval between the deltoid and pectoralis muscles was identified and this plane developed, retracting the cephalic vein laterally with the deltoid muscle. The conjoined tendon was identified. Its lateral margin was dissected and the Kolbel self-retraining retractor inserted. The "three sisters" were looked for but not identified as the subscapularis tendon was chronically torn and retracted. In addition, the biceps tendon also was looked for but not identified as it to appear to have been chronically torn and retracted distally. Therefore, the bursal tissues anteriorly were released to expose the joint. The inferior capsule was released with care after identifying and protecting the axillary nerve. The proximal humeral cut was made at approximately 25 of retroversion using the extra-medullary guide.   Attention was redirected to the glenoid. The labrum was debrided circumferentially before the center of the glenoid was marked with electrocautery. The guidewire was drilled into the glenoid vault using the appropriate guide. After verifying its position, it was overreamed with the 20 mm post reamer before the glenoid reamer was used to create a flat glenoid surface. The permanent mini-baseplate was impacted into place. It was stabilized using two 4.5 mm screws placed superiorly and inferiorly. These were secured to the plate using locking caps. The permanent 36 mm concentric +0 elevator laterally offset glenosphere was then impacted into place and its Morse taper locking mechanism verified using manual distraction.  Attention  was directed to the humeral side. The humeral canal was reamed sequentially beginning with the 4 mm reamer up to an 8 mm reamer. This provided  excellent circumferential chatter. The canal was broached beginning with a #6 broach and progressing to a #8 broach.  The plastic stem was inserted into the end of the broach and the proximal reaming performed. A trial reduction was performed using the -6 mm extended neutral humeral platform with the +0 mm insert. With the +0 mm insert, the arm demonstrated excellent range of motion as the hand could be brought across the chest to the opposite shoulder and brought to the top of the patient's head and to the patient's ear. The shoulder appeared stable throughout this range of motion. The joint was dislocated and the trial components removed.   The permanent #8 Identity micro-stem was connected with the -6 mm extended neutral humeral platform on the back table before this construct was impacted into place with care taken to maintain the appropriate version. The +0 mm insert was impacted into place. The shoulder was relocated using two finger pressure and again placed through a range of motion with the findings as described above.  The wound was copiously irrigated with sterile saline solution using the jet lavage system before a total of 20 cc of 0.5% Sensorcaine with epinephrine was injected into the pericapsular and peri-incisional tissues to help with postoperative analgesia. The deltopectoral interval was closed using #0 Vicryl interrupted sutures before the subcutaneous tissues were closed using 2-0 Vicryl interrupted sutures. The skin was closed using staples. Prior to closing the skin, 1 g of transexemic acid in 10 cc of normal saline was injected intra-articularly to help with postoperative bleeding. A sterile occlusive dressing was applied to the wound before the arm was placed into a shoulder immobilizer with an abduction pillow. A Polar Care system also was applied to the shoulder. The patient was then transferred back to a hospital bed before being awakened, extubated, and returned to the recovery room  in satisfactory condition after tolerating the procedure well.

## 2022-03-26 NOTE — Discharge Instructions (Addendum)
Orthopedic discharge instructions: May shower with intact OpSite dressing on post-op day #4 (Saturday) once nerve block has worn off.  Apply ice frequently to shoulder or use Polar Care device. Take hydrocodone as prescribed when needed.  May supplement with ES Tylenol if necessary. Keep shoulder immobilizer on at all times except may remove for bathing purposes and for exercises. Follow-up in 10-14 days or as scheduled.AMBULATORY SURGERY    DISCHARGE INSTRUCTIONS   The drugs that you were given will stay in your system until tomorrow so for the next 24 hours you should not:  Drive an automobile Make any legal decisions Drink any alcoholic beverage   You may resume regular meals tomorrow.  Today it is better to start with liquids and gradually work up to solid foods.  You may eat anything you prefer, but it is better to start with liquids, then soup and crackers, and gradually work up to solid foods.   Please notify your doctor immediately if you have any unusual bleeding, trouble breathing, redness and pain at the surgery site, drainage, fever, or pain not relieved by medication.    Additional Instructions:        Please contact your physician with any problems or Same Day Surgery at [phone], Monday through Friday 6 am to 4 pm, or [place] at [facility] number at [phone].      Interscalene Nerve Block with Exparel   For your surgery you have received an Interscalene Nerve Block with Exparel. Nerve Blocks affect many types of nerves, including nerves that control movement, pain and normal sensation.  You may experience feelings such as numbness, tingling, heaviness, weakness or the inability to move your arm or the feeling or sensation that your arm has "fallen asleep". A nerve block with Exparel can last up to 5 days.  Usually the weakness wears off first.  The tingling and heaviness usually wear off next.  Finally you may start to notice pain.  Keep in mind  that this may occur in any order.  Once a nerve block starts to wear off it is usually completely gone within 60 minutes. ISNB may cause mild shortness of breath, a hoarse voice, blurry vision, unequal pupils, or drooping of the face on the same side as the nerve block.  These symptoms will usually resolve with the numbness.  Very rarely the procedure itself can cause mild seizures. If needed, your surgeon will give you a prescription for pain medication.  It will take about 60 minutes for the oral pain medication to become fully effective.  So, it is recommended that you start taking this medication before the nerve block first begins to wear off, or when you first begin to feel discomfort. Take your pain medication only as prescribed.  Pain medication can cause sedation and decrease your breathing if you take more than you need for the level of pain that you have. Nausea is a common side effect of many pain medications.  You may want to eat something before taking your pain medicine to prevent nausea. After an Interscalene nerve block, you cannot feel pain, pressure or extremes in temperature in the effected arm.  Because your arm is numb it is at an increased risk for injury.  To decrease the possibility of injury, please practice the following:  While you are awake change the position of your arm frequently to prevent too much pressure on any one area for prolonged periods of time.  If you have a cast or  tight dressing, check the color or your fingers every couple of hours.  Call your surgeon with the appearance of any discoloration (white or blue). If you are given a sling to wear before you go home, please wear it  at all times until the block has completely worn off.  Do not get up at night without your sling. Please contact [facility] or your surgeon if you do not begin to regain sensation after 7 days from the surgery.  Anesthesia may be contacted by calling the Same Day Surgery Department,  Mon. through Fri., 6 am to 4 pm at [phone].   If you experience any other problems or concerns, please contact your surgeon's office. If you experience severe or prolonged shortness of breath go to the nearest emergency department.  POLAR CARE INFORMATION  MassAdvertisement.it  How to use [employer]reg Polar Care [facility] Therapy System?  YouTube   ShippingScam.co.uk  OPERATING INSTRUCTIONS  Start the product With dry hands, connect the transformer to the electrical connection located on the top of the cooler. Next, plug the transformer into an appropriate electrical outlet. The unit will automatically start running at this point.  To stop the pump, disconnect electrical power.  Unplug to stop the product when not in use. Unplugging the Polar Care unit turns it off. Always unplug immediately after use. Never leave it plugged in while unattended. Remove pad.    FIRST ADD WATER TO FILL LINE, THEN ICE---Replace ice when existing ice is almost melted  1 Discuss Treatment with your Licensed Health Care Practitioner and Use Only as Prescribed 2 Apply Insulation Barrier & Cold Therapy Pad 3 Check for Moisture 4 Inspect Skin Regularly  Tips and Trouble Shooting Usage Tips 1. Use cubed or chunked ice for optimal performance. 2. It is recommended to drain the Pad between uses. To drain the pad, hold the Pad upright with the hose pointed toward the ground. Depress the black plunger and allow water to drain out. 3. You may disconnect the Pad from the unit without removing the pad from the affected area by depressing the silver tabs on the hose coupling and gently pulling the hoses apart. The Pad and unit will seal itself and will not leak. Note: Some dripping during release is normal. 4. DO NOT RUN PUMP WITHOUT WATER! The pump in this unit is designed to run with water. Running the unit without water will cause permanent damage to the pump. 5. Unplug unit before removing  lid.  TROUBLESHOOTING GUIDE Pump not running, Water not flowing to the pad, Pad is not getting cold 1. Make sure the transformer is plugged into the wall outlet. 2. Confirm that the ice and water are filled to the indicated levels. 3. Make sure there are no kinks in the pad. 4. Gently pull on the blue tube to make sure the tube/pad junction is straight. 5. Remove the pad from the treatment site and ll it while the pad is lying at; then reapply. 6. Confirm that the pad couplings are securely attached to the unit. Listen for the double clicks (Figure 1) to confirm the pad couplings are securely attached.  Leaks    Note: Some condensation on the lines, controller, and pads is unavoidable, especially in warmer climates. 1. If using a Breg Polar Care Cold Therapy unit with a detachable Cold Therapy Pad, and a leak exists (other than condensation on the lines) disconnect the pad couplings. Make sure the silver tabs on the couplings are depressed before reconnecting  the pad to the pump hose; then confirm both sides of the coupling are properly clicked in. 2. If the coupling continues to leak or a leak is detected in the pad itself, stop using it and call [employer] Customer Care at [phone].  Cleaning After use, empty and dry the unit with a soft cloth. Warm water and mild detergent may be used occasionally to clean the pump and tubes.  WARNING: The Polar Care Cube can be cold enough to cause serious injury, including full skin necrosis. Follow these Operating Instructions, and carefully read the Product Insert (see pouch on side of unit) and the Cold Therapy Pad Fitting Instructions (provided with each Cold Therapy Pad) prior to use.  SHOULDER SLING IMMOBILIZER   VIDEO Slingshot 2 Shoulder Brace Application - YouTube ---https://www.porter.info/  INSTRUCTIONS While supporting the injured arm, slide the forearm into the sling. Wrap the adjustable shoulder strap around the neck  and shoulders and attach the strap end to the sling using  the "alligator strap tab."  Adjust the shoulder strap to the required length. Position the shoulder pad behind the neck. To secure the shoulder pad location (optional), pull the shoulder strap away from the shoulder pad, unfold the hook material on the top of the pad, then press the shoulder strap back onto the hook material to secure the pad in place. Attach the closure strap across the open top of the sling. Position the strap so that it holds the arm securely in the sling. Next, attach the thumb strap to the open end of the sling between the thumb and fingers. After sling has been fit, it may be easily removed and reapplied using the quick release buckle on shoulder strap. If a neutral pillow or 15 abduction pillow is included, place the pillow at the waistline. Attach the sling to the pillow, lining up hook material on the pillow with the loop on sling. Adjust the waist strap to fit.  If waist strap is too long, cut it to fit. Use the small piece of double sided hook material (located on top of the pillow) to secure the strap end. Place the double sided hook material on the inside of the cut strap end and secure it to the waist strap.     If no pillow is included, attach the waist strap to the sling and adjust to fit.    Washing Instructions: Straps and sling must be removed and cleaned regularly depending on your activity level and perspiration. Hand wash straps and sling in cold water with mild detergent, rinse, air dry

## 2022-03-26 NOTE — Anesthesia Procedure Notes (Signed)
Anesthesia Regional Block: Interscalene brachial plexus block   Pre-Anesthetic Checklist: , timeout performed,  Correct Patient, Correct Site, Correct Laterality,  Correct Procedure, Correct Position, site marked,  Risks and benefits discussed,  Surgical consent,  Pre-op evaluation,  At surgeon's request and post-op pain management  Laterality: Left  Prep: chloraprep       Needles:  Injection technique: Single-shot  Needle Type: Echogenic Needle     Needle Length: 4cm  Needle Gauge: 25     Additional Needles:   Procedures:,,,, ultrasound used (permanent image in chart),,    Narrative:  Start time: 03/26/2022 10:11 AM End time: 03/26/2022 10:05 AM Injection made incrementally with aspirations every 5 mL.  Performed by: Personally  Anesthesiologist: [name], MD  Additional Notes: Patient's chart reviewed and they were deemed appropriate candidate for procedure, at surgeon's request. Patient educated about risks, benefits, and alternatives of the block including but not limited to: temporary or permanent nerve damage, bleeding, infection, damage to surround tissues, pneumothorax, hemidiaphragmatic paralysis, unilateral Horner's syndrome, block failure, local anesthetic toxicity. Patient expressed understanding. A formal time-out was conducted consistent with institution rules.  Monitors were applied, and minimal sedation used (see nursing record). The site was prepped with skin prep and allowed to dry, and sterile gloves were used. A high frequency linear ultrasound probe with probe cover was utilized throughout. C5-7 nerve roots located and appeared anatomically normal, local anesthetic injected around them, and echogenic block needle trajectory was monitored throughout. Aspiration performed every 37ml. Lung and blood vessels were avoided. All injections were performed without resistance and free of blood and paresthesias. The patient tolerated the procedure well.  Injectate:  54ml exparel + 64ml 0.5% bupivacaine

## 2022-03-26 NOTE — Transfer of Care (Signed)
Immediate Anesthesia Transfer of Care Note  Patient: Kimberly Rojas  Procedure(s) Performed: REVERSE SHOULDER ARTHROPLASTY WITH BICEPS TENODESIS (Left: Shoulder)  Patient Location: PACU  Anesthesia Type:GA combined with regional for post-op pain  Level of Consciousness: awake  Airway & Oxygen Therapy: Patient Spontanous Breathing and Patient connected to face mask oxygen  Post-op Assessment: Report given to RN and Post -op Vital signs reviewed and stable  Post vital signs: Reviewed and stable  Last Vitals:  Vitals Value Taken Time  BP 149/84 03/26/22 1323  Temp    Pulse 77 03/26/22 1323  Resp 15 03/26/22 1323  SpO2 100 % 03/26/22 1323  Vitals shown include unvalidated device data.  Last Pain:  Vitals:   03/26/22 1012  TempSrc:   PainSc: 0-No pain         Complications: No notable events documented.

## 2022-03-26 NOTE — Anesthesia Preprocedure Evaluation (Addendum)
Anesthesia Evaluation  Patient identified by MRN, date of birth, ID band Patient awake    Reviewed: Allergy & Precautions, NPO status , Patient's Chart, lab work & pertinent test results  History of Anesthesia Complications Negative for: history of anesthetic complications  Airway Mallampati: II  TM Distance: >3 FB Neck ROM: full    Dental  (+) Lower Dentures, Upper Dentures, Missing   Pulmonary neg pulmonary ROS, neg shortness of breath,    Pulmonary exam normal        Cardiovascular (-) anginanegative cardio ROS Normal cardiovascular exam     Neuro/Psych PSYCHIATRIC DISORDERS negative neurological ROS     GI/Hepatic Neg liver ROS, GERD  ,  Endo/Other  negative endocrine ROS  Renal/GU      Musculoskeletal   Abdominal   Peds  Hematology negative hematology ROS (+)   Anesthesia Other Findings Past Medical History: No date: Anxiety No date: Arthritis No date: Chicken pox No date: Dyspnea     Comment:  denies No date: Dysrhythmia     Comment:  tachy at times No date: Fatigue No date: GERD (gastroesophageal reflux disease) No date: Hair loss No date: Heart murmur     Comment:  hx of  No date: Heartburn No date: Hyperlipidemia No date: Migraines     Comment:  had many years ago, not currently No date: Peptic ulcer No date: Thyroid nodule  Past Surgical History: 2005-2010: BREAST EXCISIONAL BIOPSY; Left     Comment:  benign No date: BREAST SURGERY     Comment:  left breast biopsy  No date: CESAREAN SECTION No date: CHOLECYSTECTOMY No date: DILATION AND CURETTAGE OF UTERUS 05/18/2019: KNEE ARTHROSCOPY WITH LATERAL MENISECTOMY; Left     Comment:  Procedure: KNEE ARTHROSCOPY WITH DEBRIDEMENT AND PARTIAL              LATERAL MENISECTOMY;  Surgeon: Christena Flake, MD;                Location: [facility] ORS;  Service: Orthopedics;  Laterality:               Left; 03/31/2013: SHOULDER OPEN ROTATOR CUFF REPAIR;  Right     Comment:  Procedure: RIGHT ROTATOR CUFF REPAIR SHOULDER OPEN;                Surgeon: Jacki Cones, MD;  Location: [facility];                Service: Orthopedics;  Laterality: Right;  With Patch and              Anchor No date: TONSILLECTOMY AND ADENOIDECTOMY  BMI    Body Mass Index: 25.20 kg/m      Reproductive/Obstetrics negative OB ROS                             Anesthesia Physical Anesthesia Plan  ASA: 2  Anesthesia Plan: General/Spinal   Post-op Pain Management: Regional block*   Induction: Intravenous  PONV Risk Score and Plan: 3 and Ondansetron and Dexamethasone  Airway Management Planned: Oral ETT  Additional Equipment:   Intra-op Plan:   Post-operative Plan: Extubation in OR  Informed Consent: I have reviewed the patients History and Physical, chart, labs and discussed the procedure including the risks, benefits and alternatives for the proposed anesthesia with the patient or authorized representative who has indicated his/her understanding and acceptance.     Dental Advisory Given  Plan Discussed  with: Anesthesiologist, CRNA and Surgeon  Anesthesia Plan Comments: (Patient consented for risks of anesthesia including but not limited to:  - adverse reactions to medications - damage to eyes, teeth, lips or other oral mucosa - nerve damage due to positioning  - sore throat or hoarseness - Damage to heart, brain, nerves, lungs, other parts of body or loss of life  Patient voiced understanding.)       Anesthesia Quick Evaluation

## 2022-03-26 NOTE — Anesthesia Procedure Notes (Signed)
Procedure Name: Intubation Date/Time: 03/26/2022 11:08 AM  Performed by: Loletha Grayer, CRNAPre-anesthesia Checklist: Patient identified, Patient being monitored, Timeout performed, Emergency Drugs available and Suction available Patient Re-evaluated:Patient Re-evaluated prior to induction Oxygen Delivery Method: Circle system utilized Preoxygenation: Pre-oxygenation with 100% oxygen Induction Type: IV induction Ventilation: Mask ventilation without difficulty Laryngoscope Size: 3 and Mac Grade View: Grade I Tube type: Oral Tube size: 6.5 mm Number of attempts: 1 Airway Equipment and Method: Stylet Placement Confirmation: ETT inserted through vocal cords under direct vision, positive ETCO2 and breath sounds checked- equal and bilateral Secured at: 20 cm Tube secured with: Tape Dental Injury: Teeth and Oropharynx as per pre-operative assessment

## 2022-03-26 NOTE — Evaluation (Signed)
Occupational Therapy Evaluation Patient Details Name: Kimberly Rojas MRN: [id number] DOB: [birth year] Today's Date: 03/26/2022   History of Present Illness 76yo female POD0 s/p reverse L TSA.   Clinical Impression   Patient was seen for an OT evaluation this date. Pt lives alone and has plans for her son and friends/neighbors to assist as needed upon discharge. Prior to surgery, pt was active and independent but does endorse some mild difficulty with reaching overhead using LUE. Pt has orders for LUE to be immobilized and will be NWBing per MD. Patient presents with impaired strength/ROM and sensation to LUE. These impairments result in a decreased ability to perform self care tasks requiring PRN MIN A for LB dressing and bathing and MIN-MOD A for UB ADL, and MAX A for application of polar care, compression stockings, and sling/immobilizer. Pt/family/friends instructed in polar care mgt, compression stockings mgt, sling/immobilizer mgt, LUE precautions, adaptive strategies for bathing/dressing/toileting/grooming, positioning and considerations for sleep, and home/routines modifications to maximize falls prevention, safety, and independence. Handout provided. OT adjusted sling/immobilizer and polar care to improve comfort, optimize positioning, and to maximize skin integrity/safety. Pt ambulated in the hall without AD and able to negotiate 4 steps with rail with modified independence. Pt/visitors verbalized understanding of all education/training provided. Pt will benefit from skilled OT services to address these limitations and improve independence in daily tasks. Recommend follow up therapy per surgeon to maximize return to PLOF.      Recommendations for follow up therapy are one component of a multi-disciplinary discharge planning process, led by the attending physician.  Recommendations may be updated based on patient status, additional functional criteria and insurance authorization.   Follow Up  Recommendations  Follow physician's recommendations for discharge plan and follow up therapies    Assistance Recommended at Discharge Intermittent Supervision/Assistance  Patient can return home with the following A little help with bathing/dressing/bathroom;Assistance with cooking/housework;Assist for transportation    Functional Status Assessment  Patient has had a recent decline in their functional status and demonstrates the ability to make significant improvements in function in a reasonable and predictable amount of time.  Equipment Recommendations  None recommended by OT    Recommendations for Other Services       Precautions / Restrictions Precautions Precautions: Shoulder Shoulder Interventions: Shoulder sling/immobilizer;Shoulder abduction pillow;At all times;Off for dressing/bathing/exercises Precaution Booklet Issued: Yes (comment) Restrictions Weight Bearing Restrictions: Yes LUE Weight Bearing: Non weight bearing      Mobility Bed Mobility               General bed mobility comments: NT    Transfers Overall transfer level: Modified independent                 General transfer comment: able to complete STS transfers without assist      Balance Overall balance assessment: No apparent balance deficits (not formally assessed)                                         ADL either performed or assessed with clinical judgement   ADL Overall ADL's : Needs assistance/impaired                                       General ADL Comments: Pt requires PRN MIN A for LB dressing/bathing (will require  assist for compression stockings at home), MIN-MOD A for UB dressing/bathing, and MAX A for sling and polar care mgt.     Vision         Perception     Praxis      Pertinent Vitals/Pain Pain Assessment Pain Assessment: No/denies pain     Hand Dominance Right   Extremity/Trunk Assessment Upper Extremity  Assessment Upper Extremity Assessment: LUE deficits/detail;RUE deficits/detail RUE Deficits / Details: WFL LUE Deficits / Details: able to move fingers/wrist some but still limited by impaired sensation LUE: Unable to fully assess due to immobilization LUE Sensation: decreased proprioception;decreased light touch LUE Coordination: decreased fine motor;decreased gross motor   Lower Extremity Assessment Lower Extremity Assessment: Overall WFL for tasks assessed   Cervical / Trunk Assessment Cervical / Trunk Assessment: Normal   Communication Communication Communication: No difficulties   Cognition Arousal/Alertness: Awake/alert Behavior During Therapy: WFL for tasks assessed/performed Overall Cognitive Status: Within Functional Limits for tasks assessed                                       General Comments       Exercises Other Exercises Other Exercises: Pt/family/friends instructed in shoulder precautions and how to maintain during ADL, shoulder sling mgt, polar care mgt, compression stocking mgt, fall prevention, and home/routines modifications to maximize safety during recovery. Handout provided to support recall and carryover. Other Exercises: Pt ambulated and negotiated 4 steps with handrail to mimic home set up with modified independence.   Shoulder Instructions      Home Living Family/patient expects to be discharged to:: Private residence Living Arrangements: Alone Available Help at Discharge: Family;Friend(s);Neighbor;Available PRN/intermittently Type of Home: House Home Access: Stairs to enter [employer] of Steps: 3 Entrance Stairs-Rails: Right;Left Home Layout: One level     Bathroom Shower/Tub: Walk-in shower;Tub/shower unit   Bathroom Toilet: Handicapped height     Home Equipment: Shower seat - built in;Cane - single point          Prior Functioning/Environment Prior Level of Function : Independent/Modified  Independent;Driving               ADLs Comments: some minor difficulty reaching overhead with LUE prior to surgery        OT Problem List: Impaired UE functional use;Impaired sensation;Decreased range of motion;Decreased strength      OT Treatment/Interventions:      OT Goals(Current goals can be found in the care plan section) Acute Rehab OT Goals Patient Stated Goal: go home and return to independence OT Goal Formulation: All assessment and education complete, DC therapy  OT Frequency:      Co-evaluation              AM-PAC OT "6 Clicks" Daily Activity     Outcome Measure Help from another person eating meals?: None Help from another person taking care of personal grooming?: None Help from another person toileting, which includes using toliet, bedpan, or urinal?: None Help from another person bathing (including washing, rinsing, drying)?: A Little Help from another person to put on and taking off regular upper body clothing?: A Lot Help from another person to put on and taking off regular lower body clothing?: A Little 6 Click Score: 20   End of Session    Activity Tolerance: Patient tolerated treatment well Patient left: in chair;with family/visitor present  OT Visit Diagnosis: Other abnormalities of gait and  mobility (R26.89)                Time: 9449-6759 OT Time Calculation (min): 58 min Charges:  OT General Charges $OT Visit: 1 Visit OT Evaluation $OT Eval Moderate Complexity: 1 Mod OT Treatments $Self Care/Home Management : 38-52 mins  Arman Filter., MPH, MS, OTR/L ascom [phone] 03/26/22, 4:36 PM

## 2022-03-27 ENCOUNTER — Encounter: Payer: Self-pay | Admitting: Surgery

## 2022-03-27 DIAGNOSIS — Z96612 Presence of left artificial shoulder joint: Secondary | ICD-10-CM | POA: Diagnosis not present

## 2022-03-27 DIAGNOSIS — E785 Hyperlipidemia, unspecified: Secondary | ICD-10-CM | POA: Diagnosis not present

## 2022-03-27 DIAGNOSIS — G43909 Migraine, unspecified, not intractable, without status migrainosus: Secondary | ICD-10-CM | POA: Diagnosis not present

## 2022-03-27 DIAGNOSIS — Z471 Aftercare following joint replacement surgery: Secondary | ICD-10-CM | POA: Diagnosis not present

## 2022-03-28 LAB — SURGICAL PATHOLOGY

## 2022-03-28 NOTE — Anesthesia Postprocedure Evaluation (Signed)
Anesthesia Post Note  Patient: Kimberly Rojas  Procedure(s) Performed: REVERSE SHOULDER ARTHROPLASTY WITH BICEPS TENODESIS (Left: Shoulder)  Patient location during evaluation: PACU Anesthesia Type: General Level of consciousness: awake and alert Pain management: pain level controlled Vital Signs Assessment: post-procedure vital signs reviewed and stable Respiratory status: spontaneous breathing, nonlabored ventilation, respiratory function stable and patient connected to nasal cannula oxygen Cardiovascular status: blood pressure returned to baseline and stable Postop Assessment: no apparent nausea or vomiting Anesthetic complications: no   No notable events documented.   Last Vitals:  Vitals:   03/26/22 1438 03/26/22 1656  BP: (!) 156/87 (!) 144/77  Pulse: 73 66  Resp: 18 16  Temp: 36.7 C   SpO2: 100% 97%    Last Pain:  Vitals:   03/26/22 1656  TempSrc:   PainSc: 0-No pain                 [name]

## 2022-03-29 DIAGNOSIS — E785 Hyperlipidemia, unspecified: Secondary | ICD-10-CM | POA: Diagnosis not present

## 2022-03-29 DIAGNOSIS — G43909 Migraine, unspecified, not intractable, without status migrainosus: Secondary | ICD-10-CM | POA: Diagnosis not present

## 2022-03-29 DIAGNOSIS — Z96612 Presence of left artificial shoulder joint: Secondary | ICD-10-CM | POA: Diagnosis not present

## 2022-03-29 DIAGNOSIS — Z471 Aftercare following joint replacement surgery: Secondary | ICD-10-CM | POA: Diagnosis not present

## 2022-03-31 DIAGNOSIS — Z471 Aftercare following joint replacement surgery: Secondary | ICD-10-CM | POA: Diagnosis not present

## 2022-03-31 DIAGNOSIS — Z96612 Presence of left artificial shoulder joint: Secondary | ICD-10-CM | POA: Diagnosis not present

## 2022-03-31 DIAGNOSIS — E785 Hyperlipidemia, unspecified: Secondary | ICD-10-CM | POA: Diagnosis not present

## 2022-03-31 DIAGNOSIS — G43909 Migraine, unspecified, not intractable, without status migrainosus: Secondary | ICD-10-CM | POA: Diagnosis not present

## 2022-04-02 ENCOUNTER — Other Ambulatory Visit: Payer: Medicare Other

## 2022-04-02 DIAGNOSIS — E785 Hyperlipidemia, unspecified: Secondary | ICD-10-CM | POA: Diagnosis not present

## 2022-04-02 DIAGNOSIS — G43909 Migraine, unspecified, not intractable, without status migrainosus: Secondary | ICD-10-CM | POA: Diagnosis not present

## 2022-04-02 DIAGNOSIS — Z471 Aftercare following joint replacement surgery: Secondary | ICD-10-CM | POA: Diagnosis not present

## 2022-04-02 DIAGNOSIS — Z96612 Presence of left artificial shoulder joint: Secondary | ICD-10-CM | POA: Diagnosis not present

## 2022-04-04 DIAGNOSIS — E785 Hyperlipidemia, unspecified: Secondary | ICD-10-CM | POA: Diagnosis not present

## 2022-04-04 DIAGNOSIS — G43909 Migraine, unspecified, not intractable, without status migrainosus: Secondary | ICD-10-CM | POA: Diagnosis not present

## 2022-04-04 DIAGNOSIS — Z96612 Presence of left artificial shoulder joint: Secondary | ICD-10-CM | POA: Diagnosis not present

## 2022-04-04 DIAGNOSIS — Z471 Aftercare following joint replacement surgery: Secondary | ICD-10-CM | POA: Diagnosis not present

## 2022-04-05 DIAGNOSIS — E785 Hyperlipidemia, unspecified: Secondary | ICD-10-CM | POA: Diagnosis not present

## 2022-04-05 DIAGNOSIS — Z471 Aftercare following joint replacement surgery: Secondary | ICD-10-CM | POA: Diagnosis not present

## 2022-04-05 DIAGNOSIS — Z96612 Presence of left artificial shoulder joint: Secondary | ICD-10-CM | POA: Diagnosis not present

## 2022-04-05 DIAGNOSIS — G43909 Migraine, unspecified, not intractable, without status migrainosus: Secondary | ICD-10-CM | POA: Diagnosis not present

## 2022-04-08 DIAGNOSIS — E785 Hyperlipidemia, unspecified: Secondary | ICD-10-CM | POA: Diagnosis not present

## 2022-04-08 DIAGNOSIS — Z96612 Presence of left artificial shoulder joint: Secondary | ICD-10-CM | POA: Diagnosis not present

## 2022-04-08 DIAGNOSIS — Z471 Aftercare following joint replacement surgery: Secondary | ICD-10-CM | POA: Diagnosis not present

## 2022-04-08 DIAGNOSIS — G43909 Migraine, unspecified, not intractable, without status migrainosus: Secondary | ICD-10-CM | POA: Diagnosis not present

## 2022-04-10 DIAGNOSIS — M7521 Bicipital tendinitis, right shoulder: Secondary | ICD-10-CM | POA: Diagnosis not present

## 2022-04-10 DIAGNOSIS — M7581 Other shoulder lesions, right shoulder: Secondary | ICD-10-CM | POA: Diagnosis not present

## 2022-04-10 DIAGNOSIS — Z96612 Presence of left artificial shoulder joint: Secondary | ICD-10-CM | POA: Diagnosis not present

## 2022-04-10 DIAGNOSIS — R29898 Other symptoms and signs involving the musculoskeletal system: Secondary | ICD-10-CM | POA: Diagnosis not present

## 2022-04-10 DIAGNOSIS — M12812 Other specific arthropathies, not elsewhere classified, left shoulder: Secondary | ICD-10-CM | POA: Diagnosis not present

## 2022-04-10 DIAGNOSIS — M25612 Stiffness of left shoulder, not elsewhere classified: Secondary | ICD-10-CM | POA: Diagnosis not present

## 2022-04-10 DIAGNOSIS — M25512 Pain in left shoulder: Secondary | ICD-10-CM | POA: Diagnosis not present

## 2022-04-23 ENCOUNTER — Other Ambulatory Visit (INDEPENDENT_AMBULATORY_CARE_PROVIDER_SITE_OTHER): Payer: Medicare Other

## 2022-04-23 DIAGNOSIS — E038 Other specified hypothyroidism: Secondary | ICD-10-CM | POA: Diagnosis not present

## 2022-04-23 DIAGNOSIS — R61 Generalized hyperhidrosis: Secondary | ICD-10-CM | POA: Diagnosis not present

## 2022-04-23 DIAGNOSIS — E7800 Pure hypercholesterolemia, unspecified: Secondary | ICD-10-CM

## 2022-04-23 LAB — LIPID PANEL
Cholesterol: 157 mg/dL (ref 0–200)
HDL: 64.4 mg/dL (ref >39.00)
LDL Cholesterol: 79 mg/dL (ref 0–99)
NonHDL: 92.76
Total CHOL/HDL Ratio: 2
Triglycerides: 67 mg/dL (ref 0.0–149.0)
VLDL: 13.4 mg/dL (ref 0.0–40.0)

## 2022-04-23 LAB — CBC WITH DIFFERENTIAL/PLATELET
Basophils Absolute: 0.1 10*3/uL (ref 0.0–0.1)
Basophils Relative: 0.6 % (ref 0.0–3.0)
Eosinophils Absolute: 0.2 10*3/uL (ref 0.0–0.7)
Eosinophils Relative: 2 % (ref 0.0–5.0)
HCT: 35.4 % — ABNORMAL LOW (ref 36.0–46.0)
Hemoglobin: 11.8 g/dL — ABNORMAL LOW (ref 12.0–15.0)
Lymphocytes Relative: 12.9 % (ref 12.0–46.0)
Lymphs Abs: 1.3 10*3/uL (ref 0.7–4.0)
MCHC: 33.4 g/dL (ref 30.0–36.0)
MCV: 90 fl (ref 78.0–100.0)
Monocytes Absolute: 0.8 10*3/uL (ref 0.1–1.0)
Monocytes Relative: 7.6 % (ref 3.0–12.0)
Neutro Abs: 7.8 10*3/uL — ABNORMAL HIGH (ref 1.4–7.7)
Neutrophils Relative %: 76.9 % (ref 43.0–77.0)
Platelets: 247 10*3/uL (ref 150.0–400.0)
RBC: 3.93 Mil/uL (ref 3.87–5.11)
RDW: 14.7 % (ref 11.5–15.5)
WBC: 10.1 10*3/uL (ref 4.0–10.5)

## 2022-04-23 LAB — TSH: TSH: 4.43 u[IU]/mL (ref 0.35–5.50)

## 2022-04-29 ENCOUNTER — Ambulatory Visit: Payer: Medicare Other | Admitting: Internal Medicine

## 2022-05-01 ENCOUNTER — Ambulatory Visit: Payer: Medicare Other | Admitting: Internal Medicine

## 2022-05-01 DIAGNOSIS — M25512 Pain in left shoulder: Secondary | ICD-10-CM | POA: Diagnosis not present

## 2022-05-01 DIAGNOSIS — Z96612 Presence of left artificial shoulder joint: Secondary | ICD-10-CM | POA: Diagnosis not present

## 2022-05-02 ENCOUNTER — Other Ambulatory Visit: Payer: Self-pay | Admitting: Internal Medicine

## 2022-05-03 ENCOUNTER — Ambulatory Visit (INDEPENDENT_AMBULATORY_CARE_PROVIDER_SITE_OTHER): Payer: Medicare Other | Admitting: Internal Medicine

## 2022-05-03 ENCOUNTER — Encounter: Payer: Self-pay | Admitting: Internal Medicine

## 2022-05-03 VITALS — BP 130/68 | HR 72 | Temp 97.8°F | Ht 59.0 in | Wt 117.0 lb

## 2022-05-03 DIAGNOSIS — Z96612 Presence of left artificial shoulder joint: Secondary | ICD-10-CM | POA: Diagnosis not present

## 2022-05-03 DIAGNOSIS — R634 Abnormal weight loss: Secondary | ICD-10-CM | POA: Diagnosis not present

## 2022-05-03 DIAGNOSIS — F409 Phobic anxiety disorder, unspecified: Secondary | ICD-10-CM

## 2022-05-03 DIAGNOSIS — D649 Anemia, unspecified: Secondary | ICD-10-CM

## 2022-05-03 DIAGNOSIS — I471 Supraventricular tachycardia: Secondary | ICD-10-CM | POA: Diagnosis not present

## 2022-05-03 DIAGNOSIS — F5105 Insomnia due to other mental disorder: Secondary | ICD-10-CM | POA: Diagnosis not present

## 2022-05-03 NOTE — Progress Notes (Unsigned)
Subjective:  Patient ID: Kimberly Rojas, female    DOB: Jan 28, 1946  Age: 76 y.o. MRN: [id number]  CC: There were no encounter diagnoses.   HPI Kimberly Rojas presents for  Chief Complaint  Patient presents with   Follow-up    Follow up     1) anemia  post op  2) weight loss post operatively  .  Down 7 lbs   off of narcotics now and taking Boost  and eating protein at every meal. .  Her last weight at home was one week ago and was 114 , steady for the last 2 weeks.  Snacks occasionally on cookies. Nuts make her constipated,  taking colace and prune juice.   3) falls asleep during day,  has trouble falling back to sleep after getting up to void around 3 am .    4) s/p shoulder surgery:  improved range of motion.   Poggi directing PT ,  next appt next Friday   Outpatient Medications Prior to Visit  Medication Sig Dispense Refill   acetaminophen (TYLENOL) 500 MG tablet Take 500 mg by mouth every 6 (six) hours as needed.     busPIRone (BUSPAR) 7.5 MG tablet TAKE 1 TABLET(7.5 MG) BY MOUTH THREE TIMES DAILY 90 tablet 1   calcium carbonate (TUMS - DOSED IN MG ELEMENTAL CALCIUM) 500 MG chewable tablet Chew 1 tablet by mouth daily.     cholecalciferol (VITAMIN D3) 25 MCG (1000 UT) tablet Take 1,000 Units by mouth daily.     docusate sodium (COLACE) 100 MG capsule Take 100 mg by mouth 2 (two) times daily.     meclizine (ANTIVERT) 12.5 MG tablet Take 1 tablet (12.5 mg total) by mouth 3 (three) times daily as needed for dizziness. Can cause drowsiness. 30 tablet 1   Multiple Vitamin (MULTI-VITAMIN) tablet Take 1 tablet by mouth daily.      rosuvastatin (CRESTOR) 10 MG tablet TAKE 1 TABLET(10 MG) BY MOUTH 2 TIMES A WEEK 25 tablet 1   vitamin B-12 (CYANOCOBALAMIN) 1000 MCG tablet Take 1,000 mcg by mouth daily.     HYDROcodone-acetaminophen (NORCO) 5-325 MG tablet Take 1-2 tablets by mouth every 6 (six) hours as needed for moderate pain or severe pain. MAXIMUM TOTAL ACETAMINOPHEN DOSE IS 4000  MG PER DAY (Patient not taking: Reported on 05/03/2022) 30 tablet 0   No facility-administered medications prior to visit.    Review of Systems;  Patient denies headache, fevers, malaise, unintentional weight loss, skin rash, eye pain, sinus congestion and sinus pain, sore throat, dysphagia,  hemoptysis , cough, dyspnea, wheezing, chest pain, palpitations, orthopnea, edema, abdominal pain, nausea, melena, diarrhea, constipation, flank pain, dysuria, hematuria, urinary  Frequency, nocturia, numbness, tingling, seizures,  Focal weakness, Loss of consciousness,  Tremor, insomnia, depression, anxiety, and suicidal ideation.      Objective:  BP 130/68 (BP Location: Left Arm, Patient Position: Sitting, Cuff Size: Normal)   Pulse 72   Temp 97.8 F (36.6 C) (Oral)   Ht 4\' 11"  (1.499 m)   Wt 117 lb (53.1 kg)   SpO2 99%   BMI 23.63 kg/m   BP Readings from Last 3 Encounters:  05/03/22 130/68  03/26/22 (!) 144/77  03/15/22 (!) 150/66    Wt Readings from Last 3 Encounters:  05/03/22 117 lb (53.1 kg)  03/26/22 124 lb 12.5 oz (56.6 kg)  03/15/22 124 lb 12.8 oz (56.6 kg)    General appearance: alert, cooperative and appears stated age Ears: normal TM's and external  ear canals both ears Throat: lips, mucosa, and tongue normal; teeth and gums normal Neck: no adenopathy, no carotid bruit, supple, symmetrical, trachea midline and thyroid not enlarged, symmetric, no tenderness/mass/nodules Back: symmetric, no curvature. ROM normal. No CVA tenderness. Lungs: clear to auscultation bilaterally Heart: regular rate and rhythm, S1, S2 normal, no murmur, click, rub or gallop Abdomen: soft, non-tender; bowel sounds normal; no masses,  no organomegaly Pulses: 2+ and symmetric Skin: Skin color, texture, turgor normal. No rashes or lesions Lymph nodes: Cervical, supraclavicular, and axillary nodes normal.  Lab Results  Component Value Date   HGBA1C 5.7 10/24/2015    Lab Results  Component Value  Date   CREATININE 0.69 03/15/2022   CREATININE 0.84 10/03/2021   CREATININE 0.78 08/20/2021    Lab Results  Component Value Date   WBC 10.1 04/23/2022   HGB 11.8 (L) 04/23/2022   HCT 35.4 (L) 04/23/2022   PLT 247.0 04/23/2022   GLUCOSE 119 (H) 03/15/2022   CHOL 157 04/23/2022   TRIG 67.0 04/23/2022   HDL 64.40 04/23/2022   LDLDIRECT 70.0 10/03/2021   LDLCALC 79 04/23/2022   ALT 23 03/15/2022   AST 27 03/15/2022   NA 138 03/15/2022   K 3.7 03/15/2022   CL 105 03/15/2022   CREATININE 0.69 03/15/2022   BUN 13 03/15/2022   CO2 28 03/15/2022   TSH 4.43 04/23/2022   INR 1.03 03/26/2013   HGBA1C 5.7 10/24/2015    DG Shoulder Left Port  Result Date: 03/26/2022 CLINICAL DATA:  Total left shoulder arthroplasty. EXAM: LEFT SHOULDER COMPARISON:  Radiographs 01/27/2022 FINDINGS: The glenoid and humeral components of the reversed total shoulder arthroplasty appear well seated without complicating features. IMPRESSION: Well seated components of a total shoulder arthroplasty. Electronically Signed   By: Rudie Meyer M.D.   On: 03/26/2022 13:44   [place] OR NERVE BLOCK-IMAGE ONLY [facility])  Result Date: 03/26/2022 There is no interpretation for this exam.  This order is for images obtained during a surgical procedure.  Please See "Surgeries" Tab for more information regarding the procedure.    Assessment & Plan:   Problem List Items Addressed This Visit   None   I spent a total of   minutes with this patient in a face to face visit on the date of this encounter reviewing the last office visit with me on        ,  most recent with patient's cardiologist in    ,  patient'ss diet and eating habits, home blood pressure readings ,  most recent imaging study ,   and post visit ordering of testing and therapeutics.    Follow-up: No follow-ups on file.   Sherlene Shams, MD

## 2022-05-03 NOTE — Patient Instructions (Signed)
  You might want to try using Relaxium for insomnia  (as seen on TV commercials) . It contains:  Melatonin 5 mg  Chamomile 25 mg Passionflower extract 75 mg GABA 100 mg Ashwaganda extract 125 mg Magnesium citrate, glycinate, oxide (100 mg)  L tryptophan 500 mg Valerest (proprietary  ingredient ; probably valeria root extract)   You can buy on [employer]   We will repeat cbc in november if today's labs are normal.

## 2022-05-04 DIAGNOSIS — R634 Abnormal weight loss: Secondary | ICD-10-CM | POA: Insufficient documentation

## 2022-05-04 DIAGNOSIS — Z96612 Presence of left artificial shoulder joint: Secondary | ICD-10-CM | POA: Insufficient documentation

## 2022-05-04 DIAGNOSIS — D649 Anemia, unspecified: Secondary | ICD-10-CM | POA: Insufficient documentation

## 2022-05-04 LAB — IRON,TIBC AND FERRITIN PANEL
%SAT: 23 % (calc) (ref 16–45)
Ferritin: 131 ng/mL (ref 16–288)
Iron: 64 ug/dL (ref 45–160)
TIBC: 283 mcg/dL (calc) (ref 250–450)

## 2022-05-04 LAB — B12 AND FOLATE PANEL
Folate: >24 ng/mL
Vitamin B-12: 781 pg/mL (ref 200–1100)

## 2022-05-04 NOTE — Assessment & Plan Note (Signed)
sympotms are mild and related to sleep interupption by one nocturnal void.  Given her age will avoid hypnotics.

## 2022-05-04 NOTE — Assessment & Plan Note (Signed)
She is recovering with excellent mobility and ROM

## 2022-05-04 NOTE — Assessment & Plan Note (Signed)
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   I have reviewed her diet and recommended that she increase her protein and fat intake while monitoring her carbohydrates.

## 2022-05-04 NOTE — Assessment & Plan Note (Addendum)
With new right bundle branch  block noted on preoperative EKG July 2023  She has had no recurrence.  Cardiac workup deferred:  Normal ECHO 2020

## 2022-05-04 NOTE — Assessment & Plan Note (Addendum)
Appears to be post operative.  B12. Folate and iron are normal.  Will recheck in 3 months   Lab Results  Component Value Date   VITAMINB12 781 05/03/2022   Lab Results  Component Value Date   FOLATE >24.0 05/03/2022   Lab Results  Component Value Date   IRON 64 05/03/2022   TIBC 283 05/03/2022   FERRITIN 131 05/03/2022

## 2022-05-10 DIAGNOSIS — Z96612 Presence of left artificial shoulder joint: Secondary | ICD-10-CM | POA: Diagnosis not present

## 2022-05-10 DIAGNOSIS — M12812 Other specific arthropathies, not elsewhere classified, left shoulder: Secondary | ICD-10-CM | POA: Diagnosis not present

## 2022-05-13 DIAGNOSIS — Z96612 Presence of left artificial shoulder joint: Secondary | ICD-10-CM | POA: Diagnosis not present

## 2022-05-13 DIAGNOSIS — M25512 Pain in left shoulder: Secondary | ICD-10-CM | POA: Diagnosis not present

## 2022-05-16 DIAGNOSIS — Z96612 Presence of left artificial shoulder joint: Secondary | ICD-10-CM | POA: Diagnosis not present

## 2022-05-20 DIAGNOSIS — Z96612 Presence of left artificial shoulder joint: Secondary | ICD-10-CM | POA: Diagnosis not present

## 2022-05-20 DIAGNOSIS — M25512 Pain in left shoulder: Secondary | ICD-10-CM | POA: Diagnosis not present

## 2022-05-22 DIAGNOSIS — M25512 Pain in left shoulder: Secondary | ICD-10-CM | POA: Diagnosis not present

## 2022-05-22 DIAGNOSIS — Z96612 Presence of left artificial shoulder joint: Secondary | ICD-10-CM | POA: Diagnosis not present

## 2022-05-29 DIAGNOSIS — M1712 Unilateral primary osteoarthritis, left knee: Secondary | ICD-10-CM | POA: Diagnosis not present

## 2022-05-30 DIAGNOSIS — Z96612 Presence of left artificial shoulder joint: Secondary | ICD-10-CM | POA: Diagnosis not present

## 2022-05-30 DIAGNOSIS — M25512 Pain in left shoulder: Secondary | ICD-10-CM | POA: Diagnosis not present

## 2022-06-03 DIAGNOSIS — M25512 Pain in left shoulder: Secondary | ICD-10-CM | POA: Diagnosis not present

## 2022-06-03 DIAGNOSIS — Z96612 Presence of left artificial shoulder joint: Secondary | ICD-10-CM | POA: Diagnosis not present

## 2022-06-06 DIAGNOSIS — Z96612 Presence of left artificial shoulder joint: Secondary | ICD-10-CM | POA: Diagnosis not present

## 2022-06-06 DIAGNOSIS — M25512 Pain in left shoulder: Secondary | ICD-10-CM | POA: Diagnosis not present

## 2022-06-07 DIAGNOSIS — M48062 Spinal stenosis, lumbar region with neurogenic claudication: Secondary | ICD-10-CM | POA: Diagnosis not present

## 2022-06-07 DIAGNOSIS — M5416 Radiculopathy, lumbar region: Secondary | ICD-10-CM | POA: Diagnosis not present

## 2022-06-11 ENCOUNTER — Ambulatory Visit (INDEPENDENT_AMBULATORY_CARE_PROVIDER_SITE_OTHER): Payer: Medicare Other

## 2022-06-11 VITALS — Ht 59.0 in | Wt 117.0 lb

## 2022-06-11 DIAGNOSIS — Z0000 Encounter for general adult medical examination without abnormal findings: Secondary | ICD-10-CM | POA: Diagnosis not present

## 2022-06-11 NOTE — Progress Notes (Addendum)
Subjective:   Kimberly Rojas is a 76 y.o. female who presents for Medicare Annual (Subsequent) preventive examination.  Review of Systems    No ROS.  Medicare Wellness Virtual Visit.  Visual/audio telehealth visit, UTA vital signs.   See social history for additional risk factors.   Cardiac Risk Factors include: advanced age (>22men, >35 women)     Objective:    Today's Vitals   06/11/22 1404  Weight: 117 lb (53.1 kg)  Height: 4\' 11"  (1.499 m)   Body mass index is 23.63 kg/m.     06/11/2022    2:10 PM 03/26/2022    9:24 AM 03/15/2022   10:29 AM 01/27/2022    1:39 PM 06/05/2021    3:00 PM 05/18/2019    8:34 AM 05/11/2019   11:12 AM  Advanced Directives  Does Patient Have a Medical Advance Directive? No No No No No No No  Does patient want to make changes to medical advance directive? No - Patient declined        Would patient like information on creating a medical advance directive?  No - Patient declined No - Patient declined  No - Patient declined No - Patient declined     Current Medications (verified) Outpatient Encounter Medications as of 06/11/2022  Medication Sig   acetaminophen (TYLENOL) 500 MG tablet Take 500 mg by mouth every 6 (six) hours as needed.   busPIRone (BUSPAR) 7.5 MG tablet TAKE 1 TABLET(7.5 MG) BY MOUTH THREE TIMES DAILY   calcium carbonate (TUMS - DOSED IN MG ELEMENTAL CALCIUM) 500 MG chewable tablet Chew 1 tablet by mouth daily.   cholecalciferol (VITAMIN D3) 25 MCG (1000 UT) tablet Take 1,000 Units by mouth daily.   docusate sodium (COLACE) 100 MG capsule Take 100 mg by mouth 2 (two) times daily.   meclizine (ANTIVERT) 12.5 MG tablet Take 1 tablet (12.5 mg total) by mouth 3 (three) times daily as needed for dizziness. Can cause drowsiness.   Multiple Vitamin (MULTI-VITAMIN) tablet Take 1 tablet by mouth daily.    rosuvastatin (CRESTOR) 10 MG tablet TAKE 1 TABLET(10 MG) BY MOUTH 2 TIMES A WEEK   vitamin B-12 (CYANOCOBALAMIN) 1000 MCG tablet Take  1,000 mcg by mouth daily.   No facility-administered encounter medications on file as of 06/11/2022.    Allergies (verified) Tramadol, Atorvastatin, Levothyroxine, Ibuprofen, and Pantoprazole   History: Past Medical History:  Diagnosis Date   Anxiety    Arthritis    Chicken pox    Dyspnea    denies   Dysrhythmia    tachy at times   Fatigue    GERD (gastroesophageal reflux disease)    Hair loss    Heart murmur    hx of    Heartburn    Hyperlipidemia    Migraines    had many years ago, not currently   Peptic ulcer    Thyroid nodule    Past Surgical History:  Procedure Laterality Date   BREAST EXCISIONAL BIOPSY Left 2005-2010   benign   BREAST SURGERY     left breast biopsy    CESAREAN SECTION     CHOLECYSTECTOMY     DILATION AND CURETTAGE OF UTERUS     KNEE ARTHROSCOPY WITH LATERAL MENISECTOMY Left 05/18/2019   Procedure: KNEE ARTHROSCOPY WITH DEBRIDEMENT AND PARTIAL  LATERAL MENISECTOMY;  Surgeon: 05/20/2019, MD;  Location: [facility] OR[facility];  Service: Orthopedics;  Laterality: Left;   REVERSE SHOULDER ARTHROPLASTY Left 03/26/2022   Procedure: REVERSE SHOULDER ARTHROPLASTY WITH  BICEPS TENODESIS;  Surgeon: Christena Flake, MD;  Location: [facility];  Service: Orthopedics;  Laterality: Left;   SHOULDER OPEN ROTATOR CUFF REPAIR Right 03/31/2013   Procedure: RIGHT ROTATOR CUFF REPAIR SHOULDER OPEN;  Surgeon: Jacki Cones, MD;  Location: [facility];  Service: Orthopedics;  Laterality: Right;  With [employer]   TONSILLECTOMY AND ADENOIDECTOMY     Family History  Problem Relation Age of Onset   Breast cancer Mother 54   Arthritis Mother    Chronic fatigue Mother    Diabetes Father    Prostate cancer Father    Diabetes Paternal Grandfather    Social History   Socioeconomic History   Marital status: Widowed    Spouse name: Not on file   Number of children: Not on file   Years of education: Not on file   Highest education level: Not on file  Occupational History    Occupation: [email or web address]    Comment: retired  Tobacco Use   Smoking status: Never   Smokeless tobacco: Never  Vaping Use   Vaping Use: Never used  Substance and Sexual Activity   Alcohol use: Yes    Alcohol/week: 1.0 standard drink of alcohol    Types: 1 Glasses of wine per week    Comment: very rarley   Drug use: No   Sexual activity: Not Currently  Other Topics Concern   Not on file  Social History Narrative   Widower as of recent    Retired   HS education   Caffeine- 3 cups tea, no soda or coffee    Exercise- 3 times, walking   2 children- 1 passed    Social Determinants of Health   Financial Resource Strain: Low Risk  (06/11/2022)   Overall Financial Resource Strain (CARDIA)    Difficulty of Paying Living Expenses: Not hard at all  Food Insecurity: No Food Insecurity (06/11/2022)   Hunger Vital Sign    Worried About Running Out of Food in the Last Year: Never true    Ran Out of Food in the Last Year: Never true  Transportation Needs: No Transportation Needs (06/11/2022)   PRAPARE - Administrator, Civil Service (Medical): No    Lack of Transportation (Non-Medical): No  Physical Activity: Insufficiently Active (06/11/2022)   Exercise Vital Sign    Days of Exercise per Week: 3 days    Minutes of Exercise per Session: 30 min  Stress: No Stress Concern Present (06/11/2022)   [employer] of Occupational Health - Occupational Stress Questionnaire    Feeling of Stress : Not at all  Social Connections: Unknown (06/11/2022)   Social Connection and Isolation Panel [NHANES]    Frequency of Communication with Friends and Family: More than three times a week    Frequency of Social Gatherings with Friends and Family: More than three times a week    Attends Religious Services: More than 4 times per year    Active Member of [employer] or Organizations: Not on file    Attends Banker Meetings: Not on file    Marital Status: Widowed    Tobacco  Counseling Counseling given: Not Answered   Clinical Intake:  Pre-visit preparation completed: Yes        Diabetes: No  How often do you need to have someone help you when you read instructions, pamphlets, or other written materials from your doctor or pharmacy?: 1 - Never  Interpreter Needed?: No     Activities of Daily  Living    06/11/2022    2:09 PM 03/15/2022   10:31 AM  In your present state of health, do you have any difficulty performing the following activities:  Hearing? 1   Comment Hearing aids   Vision? 0   Difficulty concentrating or making decisions? 0   Walking or climbing stairs? 0   Dressing or bathing? 0   Doing errands, shopping? 0 0  Preparing Food and eating ? N   Using the Toilet? N   In the past six months, have you accidently leaked urine? Y   Comment Managed with daily pad   Do you have problems with loss of bowel control? N   Managing your Medications? N   Managing your Finances? N   Housekeeping or managing your Housekeeping? N     Patient Care Team: Sherlene Shams, MD as PCP - General (Internal Medicine)  Indicate any recent Medical Services you may have received from other than [facility] providers in the past year (date may be approximate).     Assessment:   This is a routine wellness examination for Ivet.  I connected with  [name] on 06/11/22 by a audio enabled telemedicine application and verified that I am speaking with the correct person using two identifiers.  Patient Location: Home  Provider Location: Office/Clinic  I discussed the limitations of evaluation and management by telemedicine. The patient expressed understanding and agreed to proceed.   Hearing/Vision screen Hearing Screening - Comments:: Hearing aid  Followed by [facility] Vision Screening - Comments:: Followed by Dr. Sharlot Gowda [facility]).  Cataract extracted, bilateral Annual visit  Dietary issues and exercise activities discussed: Current  Exercise Habits: Home exercise routine, Intensity: Mild   Goals Addressed               This Visit's Progress     Patient Stated     Follow up with Primary Care Provider (pt-stated)   On track     As needed       Other     Increase physical activity   On track     Pace self with activity Walk more for exercise        Depression Screen    06/11/2022    2:09 PM 05/03/2022    2:16 PM 10/03/2021    1:44 PM 07/30/2021    1:56 PM 06/26/2021    3:06 PM 06/05/2021    2:56 PM 10/09/2020    9:17 AM  PHQ 2/9 Scores  PHQ - 2 Score 0 0 0 0 0 0 0    Fall Risk    06/11/2022    2:07 PM 05/03/2022    2:15 PM 10/03/2021    1:44 PM 07/30/2021    1:56 PM 06/26/2021    3:06 PM  Fall Risk   Falls in the past year? 0 0 0 0 1  Number falls in past yr: 0    0  Injury with Fall? 0    0  Risk for fall due to : No Fall Risks No Fall Risks No Fall Risks No Fall Risks History of fall(s)  Follow up Falls evaluation completed Falls evaluation completed Falls evaluation completed Falls evaluation completed Falls evaluation completed    FALL RISK PREVENTION PERTAINING TO THE HOME: Home free of loose throw rugs in walkways, pet beds, electrical cords, etc? Yes  Adequate lighting in your home to reduce risk of falls? Yes   ASSISTIVE DEVICES UTILIZED TO PREVENT FALLS: Life alert?  Yes  Use of a cane, walker or w/c? No  Grab bars in the bathroom? Yes  Shower chair or bench in shower? Yes  Elevated toilet seat or a handicapped toilet? Yes   TIMED UP AND GO: Was the test performed? No .   Cognitive Function:        06/11/2022    2:18 PM 06/05/2021    3:14 PM 03/15/2019   11:21 AM 09/24/2016    5:29 PM  6CIT Screen  What Year? 0 points 0 points 0 points 0 points  What month? 0 points 0 points 0 points 0 points  What time? 0 points 0 points 0 points 0 points  Count back from 20 0 points 0 points 0 points 0 points  Months in reverse 0 points 0 points 0 points 0 points  Repeat phrase 0 points  0 points 0 points 0 points  Total Score 0 points 0 points 0 points 0 points    Immunizations Immunization History  Administered Date(s) Administered   Influenza,inj,Quad PF,6+ Mos 06/11/2019, 05/17/2021   Influenza-Unspecified 07/18/2015, 06/11/2019   PFIZER Comirnaty(Gray Top)Covid-19 Tri-Sucrose Vaccine 08/29/2020   PFIZER(Purple Top)SARS-COV-2 Vaccination 11/12/2019, 11/30/2019   PPD Test 10/11/2020   Pneumococcal Conjugate-13 11/26/2017   Pneumococcal Polysaccharide-23 08/08/2020   Tdap 10/31/2013   Covid-19 vaccine status: Completed vaccines x3.  Shingrix Completed?: No.    Education has been provided regarding the importance of this vaccine. Patient has been advised to call insurance company to determine out of pocket expense if they have not yet received this vaccine. Advised may also receive vaccine at local pharmacy or Health Dept. Verbalized acceptance and understanding.  Screening Tests Health Maintenance  Topic Date Due   COVID-19 Vaccine (4 - Pfizer risk series) 06/27/2022 (Originally 10/24/2020)   Zoster Vaccines- Shingrix (1 of 2) 09/11/2022 (Originally 03/17/1965)   INFLUENZA VACCINE  12/01/2022 (Originally 04/02/2022)   MAMMOGRAM  08/15/2022   TETANUS/TDAP  11/01/2023   Pneumonia Vaccine 2965+ Years old  Completed   DEXA SCAN  Completed   Hepatitis C Screening  Completed   HPV VACCINES  Aged Out   COLONOSCOPY (Pts 45-6051yrs Insurance coverage will need to be confirmed)  Discontinued   Health Maintenance There are no preventive care reminders to display for this patient.  Lung Cancer Screening: (Low Dose CT Chest recommended if Age 55-80 years, 30 pack-year currently smoking OR have quit w/in 15years.) does not qualify.   Vision Screening: Recommended annual ophthalmology exams for early detection of glaucoma and other disorders of the eye.  Dental Screening: Recommended annual dental exams for proper oral hygiene.  Community Resource Referral / Chronic Care  Management: CRR required this visit?  No   CCM required this visit?  No      Plan:     I have personally reviewed and noted the following in the patient's chart:   Medical and social history Use of alcohol, tobacco or illicit drugs  Current medications and supplements including opioid prescriptions. Patient is not currently taking opioid prescriptions. Functional ability and status Nutritional status Physical activity Advanced directives List of other physicians Hospitalizations, surgeries, and ER visits in previous 12 months Vitals Screenings to include cognitive, depression, and falls Referrals and appointments  In addition, I have reviewed and discussed with patient certain preventive protocols, quality metrics, and best practice recommendations. A written personalized care plan for preventive services as well as general preventive health recommendations were provided to patient.     OBrien-Blaney, Montrell Cessna L, LPN  06/11/2022    I have reviewed the above information and agree with above.   Deborra Medina, MD

## 2022-06-11 NOTE — Patient Instructions (Addendum)
Kimberly Rojas , Thank you for taking time to come for your Medicare Wellness Visit. I appreciate your ongoing commitment to your health goals. Please review the following plan we discussed and let me know if I can assist you in the future.   These are the goals we discussed:  Goals       Patient Stated     Follow up with Primary Care Provider (pt-stated)      As needed       Other     Increase physical activity      Pace self with activity Walk more for exercise         This is a list of the screening recommended for you and due dates:  Health Maintenance  Topic Date Due   COVID-19 Vaccine (4 - Pfizer risk series) 06/27/2022*   Zoster (Shingles) Vaccine (1 of 2) 09/11/2022*   Flu Shot  12/01/2022*   Mammogram  08/15/2022   Tetanus Vaccine  11/01/2023   Pneumonia Vaccine  Completed   DEXA scan (bone density measurement)  Completed   Hepatitis C Screening: USPSTF Recommendation to screen - Ages 60-79 yo.  Completed   HPV Vaccine  Aged Out   Colon Cancer Screening  Discontinued  *Topic was postponed. The date shown is not the original due date.    Advanced directives: End of life planning; Advance aging; Advanced directives discussed.  Copy of current HCPOA/Living Will requested upon completion.     Conditions/risks identified: none new  Next appointment: Follow up in one year for your annual wellness visit    Preventive Care 65 Years and Older, Female Preventive care refers to lifestyle choices and visits with your health care provider that can promote health and wellness. What does preventive care include? A yearly physical exam. This is also called an annual well check. Dental exams once or twice a year. Routine eye exams. Ask your health care provider how often you should have your eyes checked. Personal lifestyle choices, including: Daily care of your teeth and gums. Regular physical activity. Eating a healthy diet. Avoiding tobacco and drug use. Limiting  alcohol use. Practicing safe sex. Taking low-dose aspirin every day. Taking vitamin and mineral supplements as recommended by your health care provider. What happens during an annual well check? The services and screenings done by your health care provider during your annual well check will depend on your age, overall health, lifestyle risk factors, and family history of disease. Counseling  Your health care provider may ask you questions about your: Alcohol use. Tobacco use. Drug use. Emotional well-being. Home and relationship well-being. Sexual activity. Eating habits. History of falls. Memory and ability to understand (cognition). Work and work Statistician. Reproductive health. Screening  You may have the following tests or measurements: Height, weight, and BMI. Blood pressure. Lipid and cholesterol levels. These may be checked every 5 years, or more frequently if you are over 50 years old. Skin check. Lung cancer screening. You may have this screening every year starting at age 56 if you have a 30-pack-year history of smoking and currently smoke or have quit within the past 15 years. Fecal occult blood test (FOBT) of the stool. You may have this test every year starting at age 36. Flexible sigmoidoscopy or colonoscopy. You may have a sigmoidoscopy every 5 years or a colonoscopy every 10 years starting at age 46. Hepatitis C blood test. Hepatitis B blood test. Sexually transmitted disease (STD) testing. Diabetes screening. This is done by checking  your blood sugar (glucose) after you have not eaten for a while (fasting). You may have this done every 1-3 years. Bone density scan. This is done to screen for osteoporosis. You may have this done starting at age 49. Mammogram. This may be done every 1-2 years. Talk to your health care provider about how often you should have regular mammograms. Talk with your health care provider about your test results, treatment options, and if  necessary, the need for more tests. Vaccines  Your health care provider may recommend certain vaccines, such as: Influenza vaccine. This is recommended every year. Tetanus, diphtheria, and acellular pertussis (Tdap, Td) vaccine. You may need a Td booster every 10 years. Zoster vaccine. You may need this after age 32. Pneumococcal 13-valent conjugate (PCV13) vaccine. One dose is recommended after age 60. Pneumococcal polysaccharide (PPSV23) vaccine. One dose is recommended after age 74. Talk to your health care provider about which screenings and vaccines you need and how often you need them. This information is not intended to replace advice given to you by your health care provider. Make sure you discuss any questions you have with your health care provider. Document Released: 09/15/2015 Document Revised: 05/08/2016 Document Reviewed: 06/20/2015 Elsevier Interactive Patient Education  2017 [employer].  Fall Prevention in the Home Falls can cause injuries. They can happen to people of all ages. There are many things you can do to make your home safe and to help prevent falls. What can I do on the outside of my home? Regularly fix the edges of walkways and driveways and fix any cracks. Remove anything that might make you trip as you walk through a door, such as a raised step or threshold. Trim any bushes or trees on the path to your home. Use bright outdoor lighting. Clear any walking paths of anything that might make someone trip, such as rocks or tools. Regularly check to see if handrails are loose or broken. Make sure that both sides of any steps have handrails. Any raised decks and porches should have guardrails on the edges. Have any leaves, snow, or ice cleared regularly. Use sand or salt on walking paths during winter. Clean up any spills in your garage right away. This includes oil or grease spills. What can I do in the bathroom? Use night lights. Install grab bars by the toilet  and in the tub and shower. Do not use towel bars as grab bars. Use non-skid mats or decals in the tub or shower. If you need to sit down in the shower, use a plastic, non-slip stool. Keep the floor dry. Clean up any water that spills on the floor as soon as it happens. Remove soap buildup in the tub or shower regularly. Attach bath mats securely with double-sided non-slip rug tape. Do not have throw rugs and other things on the floor that can make you trip. What can I do in the bedroom? Use night lights. Make sure that you have a light by your bed that is easy to reach. Do not use any sheets or blankets that are too big for your bed. They should not hang down onto the floor. Have a firm chair that has side arms. You can use this for support while you get dressed. Do not have throw rugs and other things on the floor that can make you trip. What can I do in the kitchen? Clean up any spills right away. Avoid walking on wet floors. Keep items that you use a lot  in easy-to-reach places. If you need to reach something above you, use a strong step stool that has a grab bar. Keep electrical cords out of the way. Do not use floor polish or wax that makes floors slippery. If you must use wax, use non-skid floor wax. Do not have throw rugs and other things on the floor that can make you trip. What can I do with my stairs? Do not leave any items on the stairs. Make sure that there are handrails on both sides of the stairs and use them. Fix handrails that are broken or loose. Make sure that handrails are as long as the stairways. Check any carpeting to make sure that it is firmly attached to the stairs. Fix any carpet that is loose or worn. Avoid having throw rugs at the top or bottom of the stairs. If you do have throw rugs, attach them to the floor with carpet tape. Make sure that you have a light switch at the top of the stairs and the bottom of the stairs. If you do not have them, ask someone to add  them for you. What else can I do to help prevent falls? Wear shoes that: Do not have high heels. Have rubber bottoms. Are comfortable and fit you well. Are closed at the toe. Do not wear sandals. If you use a stepladder: Make sure that it is fully opened. Do not climb a closed stepladder. Make sure that both sides of the stepladder are locked into place. Ask someone to hold it for you, if possible. Clearly mark and make sure that you can see: Any grab bars or handrails. First and last steps. Where the edge of each step is. Use tools that help you move around (mobility aids) if they are needed. These include: Canes. Walkers. Scooters. Crutches. Turn on the lights when you go into a dark area. Replace any light bulbs as soon as they burn out. Set up your furniture so you have a clear path. Avoid moving your furniture around. If any of your floors are uneven, fix them. If there are any pets around you, be aware of where they are. Review your medicines with your doctor. Some medicines can make you feel dizzy. This can increase your chance of falling. Ask your doctor what other things that you can do to help prevent falls. This information is not intended to replace advice given to you by your health care provider. Make sure you discuss any questions you have with your health care provider. Document Released: 06/15/2009 Document Revised: 01/25/2016 Document Reviewed: 09/23/2014 Elsevier Interactive Patient Education  2017 [employer].

## 2022-07-02 DIAGNOSIS — H02885 Meibomian gland dysfunction left lower eyelid: Secondary | ICD-10-CM | POA: Diagnosis not present

## 2022-07-02 DIAGNOSIS — H52223 Regular astigmatism, bilateral: Secondary | ICD-10-CM | POA: Diagnosis not present

## 2022-07-02 DIAGNOSIS — H5213 Myopia, bilateral: Secondary | ICD-10-CM | POA: Diagnosis not present

## 2022-07-02 DIAGNOSIS — H524 Presbyopia: Secondary | ICD-10-CM | POA: Diagnosis not present

## 2022-07-02 DIAGNOSIS — H35361 Drusen (degenerative) of macula, right eye: Secondary | ICD-10-CM | POA: Diagnosis not present

## 2022-07-03 ENCOUNTER — Other Ambulatory Visit: Payer: Self-pay | Admitting: Internal Medicine

## 2022-07-03 DIAGNOSIS — Z1231 Encounter for screening mammogram for malignant neoplasm of breast: Secondary | ICD-10-CM

## 2022-07-11 DIAGNOSIS — M5416 Radiculopathy, lumbar region: Secondary | ICD-10-CM | POA: Diagnosis not present

## 2022-07-11 DIAGNOSIS — M48062 Spinal stenosis, lumbar region with neurogenic claudication: Secondary | ICD-10-CM | POA: Diagnosis not present

## 2022-07-11 DIAGNOSIS — M62830 Muscle spasm of back: Secondary | ICD-10-CM | POA: Diagnosis not present

## 2022-08-01 DIAGNOSIS — H9202 Otalgia, left ear: Secondary | ICD-10-CM | POA: Diagnosis not present

## 2022-08-01 DIAGNOSIS — M26622 Arthralgia of left temporomandibular joint: Secondary | ICD-10-CM | POA: Diagnosis not present

## 2022-08-09 ENCOUNTER — Ambulatory Visit: Payer: Medicare Other | Admitting: Family

## 2022-08-16 ENCOUNTER — Ambulatory Visit
Admission: RE | Admit: 2022-08-16 | Discharge: 2022-08-16 | Disposition: A | Discharge status: Home / Self Care | Payer: Medicare Other | Source: Ambulatory Visit | Attending: Internal Medicine | Admitting: Internal Medicine

## 2022-08-16 DIAGNOSIS — Z1231 Encounter for screening mammogram for malignant neoplasm of breast: Secondary | ICD-10-CM | POA: Insufficient documentation

## 2022-08-29 DIAGNOSIS — H9313 Tinnitus, bilateral: Secondary | ICD-10-CM | POA: Diagnosis not present

## 2022-08-29 DIAGNOSIS — H6123 Impacted cerumen, bilateral: Secondary | ICD-10-CM | POA: Diagnosis not present

## 2022-08-30 DIAGNOSIS — M1712 Unilateral primary osteoarthritis, left knee: Secondary | ICD-10-CM | POA: Diagnosis not present

## 2022-09-05 DIAGNOSIS — M1712 Unilateral primary osteoarthritis, left knee: Secondary | ICD-10-CM | POA: Diagnosis not present

## 2022-09-11 DIAGNOSIS — M1712 Unilateral primary osteoarthritis, left knee: Secondary | ICD-10-CM | POA: Diagnosis not present

## 2022-09-27 DIAGNOSIS — M12812 Other specific arthropathies, not elsewhere classified, left shoulder: Secondary | ICD-10-CM | POA: Diagnosis not present

## 2022-09-27 DIAGNOSIS — Z96612 Presence of left artificial shoulder joint: Secondary | ICD-10-CM | POA: Diagnosis not present

## 2022-10-09 DIAGNOSIS — M25462 Effusion, left knee: Secondary | ICD-10-CM | POA: Diagnosis not present

## 2022-10-09 DIAGNOSIS — M1712 Unilateral primary osteoarthritis, left knee: Secondary | ICD-10-CM | POA: Diagnosis not present

## 2022-10-28 DIAGNOSIS — Z20822 Contact with and (suspected) exposure to covid-19: Secondary | ICD-10-CM | POA: Diagnosis not present

## 2022-10-28 DIAGNOSIS — U071 COVID-19: Secondary | ICD-10-CM | POA: Diagnosis not present

## 2022-10-28 DIAGNOSIS — R070 Pain in throat: Secondary | ICD-10-CM | POA: Diagnosis not present

## 2022-10-30 DIAGNOSIS — U071 COVID-19: Secondary | ICD-10-CM | POA: Diagnosis not present

## 2022-10-30 DIAGNOSIS — J019 Acute sinusitis, unspecified: Secondary | ICD-10-CM | POA: Diagnosis not present

## 2022-10-30 DIAGNOSIS — R051 Acute cough: Secondary | ICD-10-CM | POA: Diagnosis not present

## 2022-10-30 DIAGNOSIS — J069 Acute upper respiratory infection, unspecified: Secondary | ICD-10-CM | POA: Diagnosis not present

## 2022-10-30 DIAGNOSIS — B9689 Other specified bacterial agents as the cause of diseases classified elsewhere: Secondary | ICD-10-CM | POA: Diagnosis not present

## 2022-11-01 DIAGNOSIS — E038 Other specified hypothyroidism: Secondary | ICD-10-CM | POA: Diagnosis not present

## 2022-11-19 DIAGNOSIS — M1712 Unilateral primary osteoarthritis, left knee: Secondary | ICD-10-CM | POA: Diagnosis not present

## 2022-11-19 DIAGNOSIS — M25462 Effusion, left knee: Secondary | ICD-10-CM | POA: Diagnosis not present

## 2022-11-22 ENCOUNTER — Encounter: Payer: Self-pay | Admitting: Nurse Practitioner

## 2022-11-22 ENCOUNTER — Ambulatory Visit (INDEPENDENT_AMBULATORY_CARE_PROVIDER_SITE_OTHER): Payer: Medicare Other | Admitting: Nurse Practitioner

## 2022-11-22 VITALS — BP 134/80 | HR 65 | Temp 97.6°F | Ht 59.0 in | Wt 119.0 lb

## 2022-11-22 DIAGNOSIS — K219 Gastro-esophageal reflux disease without esophagitis: Secondary | ICD-10-CM

## 2022-11-22 MED ORDER — FAMOTIDINE 20 MG PO TABS: 20.0000 mg | ORAL_TABLET | Freq: Two times a day (BID) | ORAL | 1 refills | Status: DC

## 2022-11-22 NOTE — Progress Notes (Signed)
Established Patient Office Visit  Subjective:  Patient ID: Kimberly Rojas, female    DOB: [birth year]  Age: 77 y.o. MRN: [id number]  CC:  Chief Complaint  Patient presents with   Gastroesophageal Reflux    Has had for years; takes pantoprazole as needed but rx is old. Had a bad flare last night.    HPI  Kimberly Rojas presents for:  Gastroesophageal Reflux She complains of abdominal pain, belching and heartburn. She reports no chest pain, no coughing or no hoarse voice. The current episode started in the past 7 days. The problem occurs occasionally. The problem has been unchanged.   She went  to [employer] last week, she already had the symptoms but after eating food from [employer] her symptoms worsen.  Past Medical History:  Diagnosis Date   Anxiety    Arthritis    Chicken pox    Dyspnea    denies   Dysrhythmia    tachy at times   Fatigue    GERD (gastroesophageal reflux disease)    Hair loss    Heart murmur    hx of    Heartburn    Hyperlipidemia    Migraines    had many years ago, not currently   Peptic ulcer    Thyroid nodule     Past Surgical History:  Procedure Laterality Date   BREAST EXCISIONAL BIOPSY Left 2005-2010   benign   BREAST SURGERY     left breast biopsy    CESAREAN SECTION     CHOLECYSTECTOMY     DILATION AND CURETTAGE OF UTERUS     KNEE ARTHROSCOPY WITH LATERAL MENISECTOMY Left 05/18/2019   Procedure: KNEE ARTHROSCOPY WITH DEBRIDEMENT AND PARTIAL  LATERAL MENISECTOMY;  Surgeon: Corky Mull, MD;  Location: [facility];  Service: Orthopedics;  Laterality: Left;   REVERSE SHOULDER ARTHROPLASTY Left 03/26/2022   Procedure: REVERSE SHOULDER ARTHROPLASTY WITH BICEPS TENODESIS;  Surgeon: Corky Mull, MD;  Location: [facility];  Service: Orthopedics;  Laterality: Left;   SHOULDER OPEN ROTATOR CUFF REPAIR Right 03/31/2013   Procedure: RIGHT ROTATOR CUFF REPAIR SHOULDER OPEN;  Surgeon: Tobi Bastos, MD;  Location: [facility];  Service: Orthopedics;   Laterality: Right;  With [employer]   TONSILLECTOMY AND ADENOIDECTOMY      Family History  Problem Relation Age of Onset   Breast cancer Mother 43   Arthritis Mother    Chronic fatigue Mother    Diabetes Father    Prostate cancer Father    Diabetes Paternal Grandfather     Social History   Socioeconomic History   Marital status: Widowed    Spouse name: Not on file   Number of children: Not on file   Years of education: Not on file   Highest education level: Not on file  Occupational History   Occupation: [employer]    Comment: retired  Tobacco Use   Smoking status: Never   Smokeless tobacco: Never  Vaping Use   Vaping Use: Never used  Substance and Sexual Activity   Alcohol use: Yes    Alcohol/week: 1.0 standard drink of alcohol    Types: 1 Glasses of wine per week    Comment: very rarley   Drug use: No   Sexual activity: Not Currently  Other Topics Concern   Not on file  Social History Narrative   Widower as of recent    Retired   [employer] education   Caffeine- 3 cups tea, no soda or coffee  Exercise- 3 times, walking   2 children- 1 passed    Social Determinants of Health   Financial Resource Strain: Low Risk  (06/11/2022)   Overall Financial Resource Strain (CARDIA)    Difficulty of Paying Living Expenses: Not hard at all  Food Insecurity: No Food Insecurity (06/11/2022)   Hunger Vital Sign    Worried About Running Out of Food in the Last Year: Never true    Ran Out of Food in the Last Year: Never true  Transportation Needs: No Transportation Needs (06/11/2022)   PRAPARE - Hydrologist (Medical): No    Lack of Transportation (Non-Medical): No  Physical Activity: Insufficiently Active (06/11/2022)   Exercise Vital Sign    Days of Exercise per Week: 3 days    Minutes of Exercise per Session: 30 min  Stress: No Stress Concern Present (06/11/2022)   [place]     Feeling of Stress : Not at all  Social Connections: Unknown (06/11/2022)   Social Connection and Isolation Panel [NHANES]    Frequency of Communication with Friends and Family: More than three times a week    Frequency of Social Gatherings with Friends and Family: More than three times a week    Attends Religious Services: More than 4 times per year    Active Member of [employer] or Organizations: Not on file    Attends Archivist Meetings: Not on file    Marital Status: Widowed  Intimate Partner Violence: Not At Risk (06/11/2022)   Humiliation, Afraid, Rape, and Kick questionnaire    Fear of Current or Ex-Partner: No    Emotionally Abused: No    Physically Abused: No    Sexually Abused: No     Outpatient Medications Prior to Visit  Medication Sig Dispense Refill   acetaminophen (TYLENOL) 500 MG tablet Take 500 mg by mouth every 6 (six) hours as needed.     busPIRone (BUSPAR) 7.5 MG tablet TAKE 1 TABLET(7.5 MG) BY MOUTH THREE TIMES DAILY 90 tablet 1   calcium carbonate (TUMS - DOSED IN MG ELEMENTAL CALCIUM) 500 MG chewable tablet Chew 1 tablet by mouth daily.     cholecalciferol (VITAMIN D3) 25 MCG (1000 UT) tablet Take 1,000 Units by mouth daily.     docusate sodium (COLACE) 100 MG capsule Take 100 mg by mouth 2 (two) times daily.     Multiple Vitamin (MULTI-VITAMIN) tablet Take 1 tablet by mouth daily.      rosuvastatin (CRESTOR) 10 MG tablet TAKE 1 TABLET(10 MG) BY MOUTH 2 TIMES A WEEK 25 tablet 1   vitamin B-12 (CYANOCOBALAMIN) 1000 MCG tablet Take 1,000 mcg by mouth daily.     meclizine (ANTIVERT) 12.5 MG tablet Take 1 tablet (12.5 mg total) by mouth 3 (three) times daily as needed for dizziness. Can cause drowsiness. 30 tablet 1   No facility-administered medications prior to visit.    Allergies  Allergen Reactions   Tramadol Shortness Of Breath   Atorvastatin     Back pain    Levothyroxine     Other reaction(s): Other (See Comments), Unknown Night sweats     Ibuprofen Other (See Comments)    Gas pains Gas pains Gas pains   Pantoprazole Rash    Potential Potential Potential    ROS Review of Systems  Constitutional: Negative.   HENT:  Negative for hoarse voice.   Respiratory:  Negative for cough.   Cardiovascular:  Negative for  chest pain and leg swelling.  Gastrointestinal:  Positive for abdominal pain and heartburn.  Neurological: Negative.   Psychiatric/Behavioral: Negative.        Objective:    Physical Exam Constitutional:      Appearance: Normal appearance.  Cardiovascular:     Rate and Rhythm: Normal rate and regular rhythm.     Pulses: Normal pulses.     Heart sounds: Normal heart sounds.  Pulmonary:     Effort: Pulmonary effort is normal.     Breath sounds: Normal breath sounds.  Abdominal:     General: Bowel sounds are normal.     Palpations: Abdomen is soft.     Tenderness: There is no abdominal tenderness.     Hernia: No hernia is present.  Skin:    General: Skin is warm.  Neurological:     General: No focal deficit present.     Mental Status: She is alert and oriented to person, place, and time. Mental status is at baseline.  Psychiatric:        Mood and Affect: Mood normal.        Behavior: Behavior normal.        Thought Content: Thought content normal.        Judgment: Judgment normal.     BP 134/80 (BP Location: Left Arm, Patient Position: Sitting, Cuff Size: Normal)   Pulse 65   Temp 97.6 F (36.4 C) (Oral)   Ht 4\' 11"  (1.499 m)   Wt 119 lb (54 kg)   SpO2 98%   BMI 24.04 kg/m  Wt Readings from Last 3 Encounters:  11/22/22 119 lb (54 kg)  06/11/22 117 lb (53.1 kg)  05/03/22 117 lb (53.1 kg)     Health Maintenance  Topic Date Due   Zoster Vaccines- Shingrix (1 of 2) Never done   COVID-19 Vaccine (4 - 2023-24 season) 05/03/2022   INFLUENZA VACCINE  04/03/2023   Medicare Annual Wellness (AWV)  06/12/2023   MAMMOGRAM  08/17/2023   DTaP/Tdap/Td (2 - Td or Tdap) 11/01/2023   Pneumonia  Vaccine 65+ Years old  Completed   DEXA SCAN  Completed   Hepatitis C Screening  Completed   HPV VACCINES  Aged Out   COLONOSCOPY (Pts 45-19yrs Insurance coverage will need to be confirmed)  Discontinued    There are no preventive care reminders to display for this patient.  Lab Results  Component Value Date   TSH 4.43 04/23/2022   Lab Results  Component Value Date   WBC 10.1 04/23/2022   HGB 11.8 (L) 04/23/2022   HCT 35.4 (L) 04/23/2022   MCV 90.0 04/23/2022   PLT 247.0 04/23/2022   Lab Results  Component Value Date   NA 138 03/15/2022   K 3.7 03/15/2022   CO2 28 03/15/2022   GLUCOSE 119 (H) 03/15/2022   BUN 13 03/15/2022   CREATININE 0.69 03/15/2022   BILITOT 0.6 03/15/2022   ALKPHOS 86 03/15/2022   AST 27 03/15/2022   ALT 23 03/15/2022   PROT 6.9 03/15/2022   ALBUMIN 3.7 03/15/2022   CALCIUM 9.0 03/15/2022   ANIONGAP 5 03/15/2022   GFR 66.97 10/03/2021   Lab Results  Component Value Date   CHOL 157 04/23/2022   Lab Results  Component Value Date   HDL 64.40 04/23/2022   Lab Results  Component Value Date   LDLCALC 79 04/23/2022   Lab Results  Component Value Date   TRIG 67.0 04/23/2022   Lab Results  Component Value Date  CHOLHDL 2 04/23/2022   Lab Results  Component Value Date   HGBA1C 5.7 10/24/2015      Assessment & Plan:  Gastroesophageal reflux disease, unspecified whether esophagitis present Assessment & Plan: Started on famotidine twice a day. Advised patient to avoid spicy and fried food. Advised to limit the intake of coffee, chocolate and increase hydration.    Other orders -     Famotidine; Take 1 tablet (20 mg total) by mouth 2 (two) times daily.  Dispense: 30 tablet; Refill: 1    Follow-up: Return if symptoms worsen or fail to improve.   Theresia Lo, NP

## 2022-11-22 NOTE — Patient Instructions (Addendum)
Rx sent to pharmacy. Take Probiotic OTC. If symptoms not improving call the office for further evaluation.

## 2022-12-02 DIAGNOSIS — K219 Gastro-esophageal reflux disease without esophagitis: Secondary | ICD-10-CM | POA: Insufficient documentation

## 2022-12-02 NOTE — Assessment & Plan Note (Signed)
Started on famotidine twice a day. Advised patient to avoid spicy and fried food. Advised to limit the intake of coffee, chocolate and increase hydration.

## 2022-12-03 DIAGNOSIS — E038 Other specified hypothyroidism: Secondary | ICD-10-CM | POA: Diagnosis not present

## 2022-12-20 DIAGNOSIS — M48062 Spinal stenosis, lumbar region with neurogenic claudication: Secondary | ICD-10-CM | POA: Diagnosis not present

## 2022-12-20 DIAGNOSIS — M5416 Radiculopathy, lumbar region: Secondary | ICD-10-CM | POA: Diagnosis not present

## 2022-12-31 ENCOUNTER — Other Ambulatory Visit: Payer: Self-pay

## 2022-12-31 MED ORDER — ROSUVASTATIN CALCIUM 10 MG PO TABS: ORAL_TABLET | ORAL | 1 refills | Status: DC

## 2023-01-08 DIAGNOSIS — M5416 Radiculopathy, lumbar region: Secondary | ICD-10-CM | POA: Diagnosis not present

## 2023-01-08 DIAGNOSIS — M8588 Other specified disorders of bone density and structure, other site: Secondary | ICD-10-CM | POA: Diagnosis not present

## 2023-01-08 DIAGNOSIS — M4316 Spondylolisthesis, lumbar region: Secondary | ICD-10-CM | POA: Diagnosis not present

## 2023-01-08 DIAGNOSIS — M47816 Spondylosis without myelopathy or radiculopathy, lumbar region: Secondary | ICD-10-CM | POA: Diagnosis not present

## 2023-01-08 DIAGNOSIS — M545 Low back pain, unspecified: Secondary | ICD-10-CM | POA: Diagnosis not present

## 2023-01-08 DIAGNOSIS — M5136 Other intervertebral disc degeneration, lumbar region: Secondary | ICD-10-CM | POA: Diagnosis not present

## 2023-02-04 ENCOUNTER — Telehealth: Payer: Self-pay | Admitting: Internal Medicine

## 2023-02-04 NOTE — Telephone Encounter (Signed)
Patient called and is requesting a referral to a  vascular doctor. She would like to see Dr Lockie Pares, phone # [phone].

## 2023-02-04 NOTE — Telephone Encounter (Signed)
Spoke with pt and scheduled her an appt to come in a discuss the need for referral with Dr. Darrick Huntsman.

## 2023-02-18 ENCOUNTER — Encounter: Payer: Self-pay | Admitting: Internal Medicine

## 2023-02-18 ENCOUNTER — Ambulatory Visit (INDEPENDENT_AMBULATORY_CARE_PROVIDER_SITE_OTHER): Payer: Medicare Other | Admitting: Internal Medicine

## 2023-02-18 VITALS — BP 120/63 | HR 67 | Temp 97.6°F | Ht 59.0 in | Wt 124.0 lb

## 2023-02-18 DIAGNOSIS — I700 Atherosclerosis of aorta: Secondary | ICD-10-CM

## 2023-02-18 DIAGNOSIS — E7800 Pure hypercholesterolemia, unspecified: Secondary | ICD-10-CM

## 2023-02-18 DIAGNOSIS — I872 Venous insufficiency (chronic) (peripheral): Secondary | ICD-10-CM | POA: Diagnosis not present

## 2023-02-18 DIAGNOSIS — E538 Deficiency of other specified B group vitamins: Secondary | ICD-10-CM

## 2023-02-18 DIAGNOSIS — E039 Hypothyroidism, unspecified: Secondary | ICD-10-CM

## 2023-02-18 DIAGNOSIS — I8393 Asymptomatic varicose veins of bilateral lower extremities: Secondary | ICD-10-CM | POA: Diagnosis not present

## 2023-02-18 DIAGNOSIS — R030 Elevated blood-pressure reading, without diagnosis of hypertension: Secondary | ICD-10-CM | POA: Diagnosis not present

## 2023-02-18 DIAGNOSIS — S83272S Complex tear of lateral meniscus, current injury, left knee, sequela: Secondary | ICD-10-CM

## 2023-02-18 DIAGNOSIS — F411 Generalized anxiety disorder: Secondary | ICD-10-CM

## 2023-02-18 LAB — COMPREHENSIVE METABOLIC PANEL
ALT: 11 U/L (ref 0–35)
AST: 18 U/L (ref 0–37)
Albumin: 3.8 g/dL (ref 3.5–5.2)
Alkaline Phosphatase: 88 U/L (ref 39–117)
BUN: 17 mg/dL (ref 6–23)
CO2: 29 mEq/L (ref 19–32)
Calcium: 9 mg/dL (ref 8.4–10.5)
Chloride: 100 mEq/L (ref 96–112)
Creatinine, Ser: 0.79 mg/dL (ref 0.40–1.20)
GFR: 71.39 mL/min (ref >60.00)
Glucose, Bld: 119 mg/dL — ABNORMAL HIGH (ref 70–99)
Potassium: 3.6 mEq/L (ref 3.5–5.1)
Sodium: 137 mEq/L (ref 135–145)
Total Bilirubin: 0.3 mg/dL (ref 0.2–1.2)
Total Protein: 6.8 g/dL (ref 6.0–8.3)

## 2023-02-18 LAB — LIPID PANEL
Cholesterol: 172 mg/dL (ref 0–200)
HDL: 51.4 mg/dL (ref >39.00)
LDL Cholesterol: 97 mg/dL (ref 0–99)
NonHDL: 120.61
Total CHOL/HDL Ratio: 3
Triglycerides: 120 mg/dL (ref 0.0–149.0)
VLDL: 24 mg/dL (ref 0.0–40.0)

## 2023-02-18 LAB — CBC WITH DIFFERENTIAL/PLATELET
Basophils Absolute: 0.1 10*3/uL (ref 0.0–0.1)
Basophils Relative: 0.7 % (ref 0.0–3.0)
Eosinophils Absolute: 0.2 10*3/uL (ref 0.0–0.7)
Eosinophils Relative: 3 % (ref 0.0–5.0)
HCT: 37 % (ref 36.0–46.0)
Hemoglobin: 12.3 g/dL (ref 12.0–15.0)
Lymphocytes Relative: 18.7 % (ref 12.0–46.0)
Lymphs Abs: 1.4 10*3/uL (ref 0.7–4.0)
MCHC: 33.3 g/dL (ref 30.0–36.0)
MCV: 89.7 fl (ref 78.0–100.0)
Monocytes Absolute: 0.7 10*3/uL (ref 0.1–1.0)
Monocytes Relative: 8.9 % (ref 3.0–12.0)
Neutro Abs: 5.2 10*3/uL (ref 1.4–7.7)
Neutrophils Relative %: 68.7 % (ref 43.0–77.0)
Platelets: 211 10*3/uL (ref 150.0–400.0)
RBC: 4.13 Mil/uL (ref 3.87–5.11)
RDW: 12.6 % (ref 11.5–15.5)
WBC: 7.6 10*3/uL (ref 4.0–10.5)

## 2023-02-18 LAB — TSH: TSH: 3.49 u[IU]/mL (ref 0.35–5.50)

## 2023-02-18 MED ORDER — BUSPIRONE HCL 7.5 MG PO TABS
ORAL_TABLET | ORAL | 1 refills | Status: DC
Start: 2023-02-18 — End: 2023-07-08

## 2023-02-18 MED ORDER — TRIAMCINOLONE ACETONIDE 0.1 % EX CREA: 1.0000 | TOPICAL_CREAM | Freq: Two times a day (BID) | CUTANEOUS | 0 refills | Status: DC

## 2023-02-18 NOTE — Assessment & Plan Note (Signed)
Home readings are well under 130/80 on a calibrated machine (5 pts added to home readings still at goal ).  No meds needed\

## 2023-02-18 NOTE — Assessment & Plan Note (Signed)
Managed with burpirone, takne prn.  Refilled today

## 2023-02-18 NOTE — Progress Notes (Signed)
Subjective:  Patient ID: Kimberly Rojas, female    DOB: 1946-02-23  Age: 77 y.o. MRN: [id number]  CC: The primary encounter diagnosis was B12 deficiency. Diagnoses of Pure hypercholesterolemia, White coat syndrome without diagnosis of hypertension, Acquired hypothyroidism, Varicose veins of both lower extremities without ulcer or inflammation, Generalized anxiety disorder, Venous stasis dermatitis of left lower extremity, Complex tear of lateral meniscus of left knee as current injury, sequela, and Thoracic aortic atherosclerosis (HCC) were also pertinent to this visit.   HPI Kimberly Rojas presents for  Chief Complaint  Patient presents with   discuss need for referral to vascular doctor    1) LEFT KNEE PAIN.  Saw [facility],  total knee  replacement advised and to be scheduled.  Trouble bearing weight ,  using tylenol but only 1 daily at night. .  Used Tylenol #3 and became forgetful so she stopped taking it.   3)   unsightly bulging  varicose veins on right leg.  Wants to see vein doctor in [place].  Marland Kitchen Had a vein ablation on the left years ago which resolved.    2) HTN:  home readings on a calibrate machine reviewed today all are  130/80   4) left medial lower extremity redness started after injections of gel in left knee ,  about 3 months . Does not hurt but occasionally itches,  no fevers.   Has not spread.  Improverd by morning   Confined to the medial ankle, left leg swells  to the knee by   end of day   5) low back pain :  last ESI by  Dr Shirlyn Goltz was one month ago.   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 Ws referred to [facility] in [place] for a Quatic PT but has deferred  and [facility]    6) vitamin deficiencies:  taking oral b12 and vitamin D .  Last levels were normal  Outpatient Medications Prior to Visit  Medication Sig Dispense Refill   acetaminophen (TYLENOL) 500 MG tablet Take 500 mg by mouth every 6 (six) hours as needed.     calcium  carbonate (TUMS - DOSED IN MG ELEMENTAL CALCIUM) 500 MG chewable tablet Chew 1 tablet by mouth daily.     cholecalciferol (VITAMIN D3) 25 MCG (1000 UT) tablet Take 1,000 Units by mouth daily.     docusate sodium (COLACE) 100 MG capsule Take 100 mg by mouth 2 (two) times daily.     famotidine (PEPCID) 20 MG tablet Take 1 tablet (20 mg total) by mouth 2 (two) times daily. 30 tablet 1   Multiple Vitamin (MULTI-VITAMIN) tablet Take 1 tablet by mouth daily.      rosuvastatin (CRESTOR) 10 MG tablet TAKE 1 TABLET(10 MG) BY MOUTH 2 TIMES A WEEK 25 tablet 1   vitamin B-12 (CYANOCOBALAMIN) 1000 MCG tablet Take 1,000 mcg by mouth daily.     busPIRone (BUSPAR) 7.5 MG tablet TAKE 1 TABLET(7.5 MG) BY MOUTH THREE TIMES DAILY 90 tablet 1   No facility-administered medications prior to visit.    Review of Systems;  Patient denies headache, fevers, malaise, unintentional weight loss, skin rash, eye pain, sinus congestion and sinus pain, sore throat, dysphagia,  hemoptysis , cough, dyspnea, wheezing, chest pain, palpitations, orthopnea, edema, abdominal pain, nausea, melena, diarrhea, constipation, flank pain, dysuria, hematuria, urinary  Frequency, nocturia, numbness, tingling, seizures,  Focal weakness, Loss of consciousness,  Tremor, insomnia, depression, anxiety, and suicidal ideation.  Objective:  BP 120/63   Pulse 67   Temp 97.6 F (36.4 C) (Oral)   Ht 4\' 11"  (1.499 m)   Wt 124 lb (56.2 kg)   SpO2 98%   BMI 25.04 kg/m   BP Readings from Last 3 Encounters:  02/18/23 120/63  11/22/22 134/80  05/03/22 130/68    Wt Readings from Last 3 Encounters:  02/18/23 124 lb (56.2 kg)  11/22/22 119 lb (54 kg)  06/11/22 117 lb (53.1 kg)    Physical Exam  Lab Results  Component Value Date   HGBA1C 5.7 10/24/2015    Lab Results  Component Value Date   CREATININE 0.69 03/15/2022   CREATININE 0.84 10/03/2021   CREATININE 0.78 08/20/2021    Lab Results  Component Value Date   WBC 10.1  04/23/2022   HGB 11.8 (L) 04/23/2022   HCT 35.4 (L) 04/23/2022   PLT 247.0 04/23/2022   GLUCOSE 119 (H) 03/15/2022   CHOL 157 04/23/2022   TRIG 67.0 04/23/2022   HDL 64.40 04/23/2022   LDLDIRECT 70.0 10/03/2021   LDLCALC 79 04/23/2022   ALT 23 03/15/2022   AST 27 03/15/2022   NA 138 03/15/2022   K 3.7 03/15/2022   CL 105 03/15/2022   CREATININE 0.69 03/15/2022   BUN 13 03/15/2022   CO2 28 03/15/2022   TSH 4.43 04/23/2022   INR 1.03 03/26/2013   HGBA1C 5.7 10/24/2015    MM 3D SCREEN BREAST BILATERAL  Result Date: 08/20/2022 CLINICAL DATA:  Screening. EXAM: DIGITAL SCREENING BILATERAL MAMMOGRAM WITH TOMOSYNTHESIS AND CAD TECHNIQUE: Bilateral screening digital craniocaudal and mediolateral oblique mammograms were obtained. Bilateral screening digital breast tomosynthesis was performed. The images were evaluated with computer-aided detection. COMPARISON:  Previous exam(s). ACR Breast Density Category b: There are scattered areas of fibroglandular density. FINDINGS: There are no findings suspicious for malignancy. IMPRESSION: No mammographic evidence of malignancy. A result letter of this screening mammogram will be mailed directly to the patient. RECOMMENDATION: Screening mammogram in one year. (Code:SM-B-01Y) BI-RADS CATEGORY  1: Negative. Electronically Signed   By: Elberta Fortis M.D.   On: 08/20/2022 07:56    Assessment & Plan:  .B12 deficiency Assessment & Plan: IF ab negative.   Continue oral supplementation   Orders: -     CBC with Differential/Platelet  Pure hypercholesterolemia -     Lipid panel  White coat syndrome without diagnosis of hypertension Assessment & Plan: Home readings are well under 130/80 on a calibrated machine (5 pts added to home readings still at goal ).  No meds needed\  Orders: -     Comprehensive metabolic panel  Acquired hypothyroidism -     TSH  Varicose veins of both lower extremities without ulcer or inflammation -     Ambulatory  referral to Vascular Surgery  Generalized anxiety disorder Assessment & Plan: Managed with burpirone, takne prn.  Refilled today    Venous stasis dermatitis of left lower extremity Assessment & Plan: Improves transiently with leg elevation. . Prior venous ablation remotely. Adding triamcinolone for itching    Complex tear of lateral meniscus of left knee as current injury, sequela Assessment & Plan: Now with plans for TKR by [facility]    Thoracic aortic atherosclerosis [facility]) Assessment & Plan: Noted on  previous  chest CT done by pulmonology.  Reviewed findings of prior CT scan today..  She is tolerating high potency  statin therapy starting with 10 rosuvastatin 2 times weekly    Other orders -  busPIRone HCl; TAKE 1 TABLET(7.5 MG) BY MOUTH THREE TIMES DAILY  Dispense: 90 tablet; Refill: 1 -     Triamcinolone Acetonide; Apply 1 Application topically 2 (two) times daily.  Dispense: 30 g; Refill: 0     I provided 30 minutes of face-to-face time during this encounter reviewing patient's last visit with me, patient's  most recent visit with cardiology,  nephrology,  and neurology,  recent surgical and non surgical procedures, previous  labs and imaging studies, counseling on currently addressed issues,  and post visit ordering to diagnostics and therapeutics .   Follow-up: Return in about 6 months (around 08/20/2023).   Sherlene Shams, MD

## 2023-02-18 NOTE — Patient Instructions (Addendum)
You may reserve the use  of  tylenol #3 for nighttime use  to help you rest ( not when   you are driving or travelling)   You can   take up to 2000 mg of acetominophen (tylenol) every day safely  In divided doses (500 mg every 6 hours  Or 1000 mg every 12 hours.)  The redness in your left leg is "venous stasis dermatitis "  .  It is due to the swelling and and venous insufficiency.   continue moisturizing daily and use  triamcinolone ointment as needed for occasional itching   Referral to vascular surgery in [place] for the veins on your right leg

## 2023-02-18 NOTE — Assessment & Plan Note (Signed)
IF ab negative.   Continue oral supplementation

## 2023-02-18 NOTE — Assessment & Plan Note (Addendum)
Improves transiently with leg elevation. . Prior venous ablation remotely. Adding triamcinolone for itching

## 2023-02-18 NOTE — Assessment & Plan Note (Signed)
Now with plans for TKR by [facility]

## 2023-02-18 NOTE — Assessment & Plan Note (Addendum)
Noted on  previous  chest CT done by pulmonology.  Reviewed findings of prior CT scan today..  She is tolerating high potency  statin therapy starting with 10 rosuvastatin 2 times weekly

## 2023-02-19 DIAGNOSIS — G8929 Other chronic pain: Secondary | ICD-10-CM | POA: Diagnosis not present

## 2023-02-19 DIAGNOSIS — M25562 Pain in left knee: Secondary | ICD-10-CM | POA: Diagnosis not present

## 2023-02-19 DIAGNOSIS — M1712 Unilateral primary osteoarthritis, left knee: Secondary | ICD-10-CM | POA: Diagnosis not present

## 2023-03-14 ENCOUNTER — Other Ambulatory Visit: Payer: Self-pay | Admitting: *Deleted

## 2023-03-14 DIAGNOSIS — I8393 Asymptomatic varicose veins of bilateral lower extremities: Secondary | ICD-10-CM

## 2023-03-17 DIAGNOSIS — M1712 Unilateral primary osteoarthritis, left knee: Secondary | ICD-10-CM | POA: Diagnosis not present

## 2023-03-19 DIAGNOSIS — M5136 Other intervertebral disc degeneration, lumbar region: Secondary | ICD-10-CM | POA: Diagnosis not present

## 2023-03-19 DIAGNOSIS — M5416 Radiculopathy, lumbar region: Secondary | ICD-10-CM | POA: Diagnosis not present

## 2023-03-24 DIAGNOSIS — M1712 Unilateral primary osteoarthritis, left knee: Secondary | ICD-10-CM | POA: Diagnosis not present

## 2023-03-31 DIAGNOSIS — M1712 Unilateral primary osteoarthritis, left knee: Secondary | ICD-10-CM | POA: Diagnosis not present

## 2023-04-01 ENCOUNTER — Ambulatory Visit: Payer: Medicare Other | Admitting: Internal Medicine

## 2023-04-02 ENCOUNTER — Encounter: Payer: Self-pay | Admitting: Vascular Surgery

## 2023-04-02 ENCOUNTER — Ambulatory Visit: Payer: Medicare Other | Admitting: Vascular Surgery

## 2023-04-02 ENCOUNTER — Ambulatory Visit (HOSPITAL_COMMUNITY)
Admission: RE | Admit: 2023-04-02 | Discharge: 2023-04-02 | Disposition: A | Discharge status: Home / Self Care | Payer: Medicare Other | Source: Ambulatory Visit | Attending: Vascular Surgery | Admitting: Vascular Surgery

## 2023-04-02 VITALS — BP 161/78 | HR 60 | Temp 97.9°F | Resp 18 | Ht 60.5 in | Wt 122.6 lb

## 2023-04-02 DIAGNOSIS — I8393 Asymptomatic varicose veins of bilateral lower extremities: Secondary | ICD-10-CM | POA: Diagnosis not present

## 2023-04-02 DIAGNOSIS — I872 Venous insufficiency (chronic) (peripheral): Secondary | ICD-10-CM

## 2023-04-02 NOTE — Progress Notes (Signed)
ASSESSMENT & PLAN   CHRONIC VENOUS INSUFFICIENCY: This patient has evidence of chronic venous insufficiency bilaterally.  On the right side she has enlarged varicose veins.  She does have some symptoms from venous hypertension on the right but the symptoms have been tolerable.  In the left lower extremity she has evidence of resolving phlebitis in the distal left great saphenous vein just above the ankle.  With respect to her phlebitis we have discussed the importance of elevation, warm compresses, and continued use of compression therapy.  In addition with respect to her venous disease I have encouraged her to avoid prolonged sitting and standing.  If her symptoms on the right side progressed she would need formal venous reflux testing on the right.  On the left side she had some deep venous reflux in the common femoral vein and some reflux in the left great saphenous vein however the vein was not dilated so currently she would not be a candidate for laser ablation.  However, venous disease does tend to gradually progress if this changes in the future we can certainly reevaluate.  REASON FOR CONSULT:    Painful varicose veins bilaterally.  The consult is requested by Dr. Darrick Huntsman.   HPI:   Kimberly Rojas is a 77 y.o. female who is referred with venous disease.  She describes some aching pain in her legs associated with prolonged sitting and standing and relieved with elevation.  She does wear knee-high compression stockings which does not help her symptoms.  She also states that she developed some pain in her medial left leg about a month ago.  This has been fairly persistent.  She describes some burning pain in this area.  She denies any previous history of DVT.  She has had no previous venous procedures.  I do not get any history of claudication or rest pain.  Past Medical History:  Diagnosis Date   Anxiety    Arthritis    Chicken pox    Dyspnea    denies   Dysrhythmia    tachy at times    Fatigue    GERD (gastroesophageal reflux disease)    Hair loss    Heart murmur    hx of    Heartburn    Hyperlipidemia    Migraines    had many years ago, not currently   Peptic ulcer    Thyroid nodule     Family History  Problem Relation Age of Onset   Breast cancer Mother 60   Arthritis Mother    Chronic fatigue Mother    Diabetes Father    Prostate cancer Father    Diabetes Paternal Grandfather     SOCIAL HISTORY: Social History   Tobacco Use   Smoking status: Never   Smokeless tobacco: Never  Substance Use Topics   Alcohol use: Yes    Alcohol/week: 1.0 standard drink of alcohol    Types: 1 Glasses of wine per week    Comment: very rarley    Allergies  Allergen Reactions   Tramadol Shortness Of Breath   Atorvastatin     Back pain    Levothyroxine     Other reaction(s): Other (See Comments), Unknown Night sweats    Ibuprofen Other (See Comments)    Gas pains Gas pains Gas pains   Pantoprazole Rash    Potential Potential Potential    Current Outpatient Medications  Medication Sig Dispense Refill   acetaminophen (TYLENOL) 500 MG tablet Take 500 mg by mouth  every 6 (six) hours as needed.     busPIRone (BUSPAR) 7.5 MG tablet TAKE 1 TABLET(7.5 MG) BY MOUTH THREE TIMES DAILY 90 tablet 1   calcium carbonate (TUMS - DOSED IN MG ELEMENTAL CALCIUM) 500 MG chewable tablet Chew 1 tablet by mouth daily.     cholecalciferol (VITAMIN D3) 25 MCG (1000 UT) tablet Take 1,000 Units by mouth daily.     docusate sodium (COLACE) 100 MG capsule Take 100 mg by mouth 2 (two) times daily.     Multiple Vitamin (MULTI-VITAMIN) tablet Take 1 tablet by mouth daily.      rosuvastatin (CRESTOR) 10 MG tablet TAKE 1 TABLET(10 MG) BY MOUTH 2 TIMES A WEEK 25 tablet 1   triamcinolone cream (KENALOG) 0.1 % Apply 1 Application topically 2 (two) times daily. 30 g 0   vitamin B-12 (CYANOCOBALAMIN) 1000 MCG tablet Take 1,000 mcg by mouth daily.     famotidine (PEPCID) 20 MG tablet Take 1  tablet (20 mg total) by mouth 2 (two) times daily. (Patient not taking: Reported on 04/02/2023) 30 tablet 1   No current facility-administered medications for this visit.    REVIEW OF SYSTEMS:  [X]  denotes positive finding, [ ]  denotes negative finding Cardiac  Comments:  Chest pain or chest pressure:    Shortness of breath upon exertion:    Short of breath when lying flat:    Irregular heart rhythm:        Vascular    Pain in calf, thigh, or hip brought on by ambulation:    Pain in feet at night that wakes you up from your sleep:     Blood clot in your veins:    Leg swelling:  x       Pulmonary    Oxygen at home:    Productive cough:     Wheezing:         Neurologic    Sudden weakness in arms or legs:     Sudden numbness in arms or legs:     Sudden onset of difficulty speaking or slurred speech:    Temporary loss of vision in one eye:     Problems with dizziness:         Gastrointestinal    Blood in stool:     Vomited blood:         Genitourinary    Burning when urinating:     Blood in urine:        Psychiatric    Major depression:         Hematologic    Bleeding problems:    Problems with blood clotting too easily:        Skin    Rashes or ulcers:        Constitutional    Fever or chills:    -  PHYSICAL EXAM:   Vitals:   04/02/23 1351  BP: (!) 161/78  Pulse: 60  Resp: 18  Temp: 97.9 F (36.6 C)  TempSrc: Temporal  SpO2: 100%  Weight: 122 lb 9.6 oz (55.6 kg)  Height: 5' 0.5" (1.537 m)   Body mass index is 23.55 kg/m. GENERAL: The patient is a well-nourished female, in no acute distress. The vital signs are documented above. CARDIAC: There is a regular rate and rhythm.  VASCULAR: I do not detect carotid bruits. She has palpable dorsalis pedis pulses bilaterally. She has an area of resolving phlebitis in her distal left leg with some induration and erythema.  I did look  with the SonoSite myself of the distal left great saphenous vein and she does  have evidence of thrombus in the distal great saphenous vein consistent with phlebitis. On the right side she has some dilated varicose veins as documented in the photograph below.  PULMONARY: There is good air exchange bilaterally without wheezing or rales. ABDOMEN: Soft and non-tender with normal pitched bowel sounds.  MUSCULOSKELETAL: There are no major deformities. NEUROLOGIC: No focal weakness or paresthesias are detected. SKIN: There are no ulcers or rashes noted. PSYCHIATRIC: The patient has a normal affect.  DATA:    VENOUS DUPLEX: I have independently interpreted her venous duplex scan today.  This was of the left lower extremity only.  There was no evidence of DVT.  There was deep venous reflux in the common femoral vein.  There was some superficial venous reflux from the mid thigh to the proximal calf however the vein was not especially dilated.  Diameters ranged from 2.1-3 mm.  The results of the study are summarized in the diagram below.    [facility]aver[facility]

## 2023-04-15 DIAGNOSIS — M48062 Spinal stenosis, lumbar region with neurogenic claudication: Secondary | ICD-10-CM | POA: Diagnosis not present

## 2023-04-15 DIAGNOSIS — M5416 Radiculopathy, lumbar region: Secondary | ICD-10-CM | POA: Diagnosis not present

## 2023-06-04 DIAGNOSIS — M25562 Pain in left knee: Secondary | ICD-10-CM | POA: Diagnosis not present

## 2023-06-04 DIAGNOSIS — M1712 Unilateral primary osteoarthritis, left knee: Secondary | ICD-10-CM | POA: Diagnosis not present

## 2023-06-04 DIAGNOSIS — M25462 Effusion, left knee: Secondary | ICD-10-CM | POA: Diagnosis not present

## 2023-06-04 DIAGNOSIS — G8929 Other chronic pain: Secondary | ICD-10-CM | POA: Diagnosis not present

## 2023-06-25 ENCOUNTER — Other Ambulatory Visit: Payer: Self-pay | Admitting: Surgery

## 2023-06-26 DIAGNOSIS — M48062 Spinal stenosis, lumbar region with neurogenic claudication: Secondary | ICD-10-CM | POA: Diagnosis not present

## 2023-06-26 DIAGNOSIS — M5416 Radiculopathy, lumbar region: Secondary | ICD-10-CM | POA: Diagnosis not present

## 2023-07-01 ENCOUNTER — Other Ambulatory Visit: Payer: Self-pay

## 2023-07-01 ENCOUNTER — Encounter
Admission: RE | Admit: 2023-07-01 | Discharge: 2023-07-01 | Disposition: A | Discharge status: Home / Self Care | Payer: Medicare Other | Source: Ambulatory Visit | Attending: Surgery | Admitting: Surgery

## 2023-07-01 VITALS — BP 133/76 | HR 68 | Temp 98.1°F | Resp 18 | Ht 59.0 in | Wt 121.4 lb

## 2023-07-01 DIAGNOSIS — R8271 Bacteriuria: Secondary | ICD-10-CM | POA: Insufficient documentation

## 2023-07-01 DIAGNOSIS — Z01818 Encounter for other preprocedural examination: Secondary | ICD-10-CM | POA: Insufficient documentation

## 2023-07-01 DIAGNOSIS — R8290 Unspecified abnormal findings in urine: Secondary | ICD-10-CM | POA: Diagnosis not present

## 2023-07-01 DIAGNOSIS — R8281 Pyuria: Secondary | ICD-10-CM | POA: Insufficient documentation

## 2023-07-01 DIAGNOSIS — Z01812 Encounter for preprocedural laboratory examination: Secondary | ICD-10-CM

## 2023-07-01 LAB — COMPREHENSIVE METABOLIC PANEL
ALT: 13 U/L (ref 0–44)
AST: 21 U/L (ref 15–41)
Albumin: 3.8 g/dL (ref 3.5–5.0)
Alkaline Phosphatase: 107 U/L (ref 38–126)
Anion gap: 8 (ref 5–15)
BUN: 21 mg/dL (ref 8–23)
CO2: 26 mmol/L (ref 22–32)
Calcium: 8.8 mg/dL — ABNORMAL LOW (ref 8.9–10.3)
Chloride: 101 mmol/L (ref 98–111)
Creatinine, Ser: 0.78 mg/dL (ref 0.44–1.00)
GFR, Estimated: >60 mL/min (ref >60)
Glucose, Bld: 106 mg/dL — ABNORMAL HIGH (ref 70–99)
Potassium: 3.6 mmol/L (ref 3.5–5.1)
Sodium: 135 mmol/L (ref 135–145)
Total Bilirubin: 0.6 mg/dL (ref 0.3–1.2)
Total Protein: 6.8 g/dL (ref 6.5–8.1)

## 2023-07-01 LAB — URINALYSIS, ROUTINE W REFLEX MICROSCOPIC
Bilirubin Urine: NEGATIVE
Glucose, UA: NEGATIVE mg/dL
Ketones, ur: NEGATIVE mg/dL
Nitrite: NEGATIVE
Protein, ur: NEGATIVE mg/dL
Specific Gravity, Urine: 1.006 (ref 1.005–1.030)
pH: 5 (ref 5.0–8.0)

## 2023-07-01 LAB — CBC WITH DIFFERENTIAL/PLATELET
Abs Immature Granulocytes: 0.03 10*3/uL (ref 0.00–0.07)
Basophils Absolute: 0 10*3/uL (ref 0.0–0.1)
Basophils Relative: 1 %
Eosinophils Absolute: 0.2 10*3/uL (ref 0.0–0.5)
Eosinophils Relative: 2 %
HCT: 36.6 % (ref 36.0–46.0)
Hemoglobin: 12.5 g/dL (ref 12.0–15.0)
Immature Granulocytes: 0 %
Lymphocytes Relative: 18 %
Lymphs Abs: 1.2 10*3/uL (ref 0.7–4.0)
MCH: 29.8 pg (ref 26.0–34.0)
MCHC: 34.2 g/dL (ref 30.0–36.0)
MCV: 87.1 fL (ref 80.0–100.0)
Monocytes Absolute: 0.7 10*3/uL (ref 0.1–1.0)
Monocytes Relative: 9 %
Neutro Abs: 4.9 10*3/uL (ref 1.7–7.7)
Neutrophils Relative %: 70 %
Platelets: 206 10*3/uL (ref 150–400)
RBC: 4.2 MIL/uL (ref 3.87–5.11)
RDW: 13.1 % (ref 11.5–15.5)
WBC: 7 10*3/uL (ref 4.0–10.5)
nRBC: 0 % (ref 0.0–0.2)

## 2023-07-01 LAB — SURGICAL PCR SCREEN
MRSA, PCR: NEGATIVE
Staphylococcus aureus: NEGATIVE

## 2023-07-01 NOTE — Patient Instructions (Addendum)
Report to the Registration Desk on the 1st floor of the [facility]. Tuesday 07/08/2023  To find out your arrival time, please call [phone] between 1PM - 3PM on: Monday 07/07/23 If your arrival time is 6:00 am, do not arrive before that time as the [facility] entrance doors do not open until 6:00 am.  REMEMBER: Instructions that are not followed completely may result in serious medical risk, up to and including death; or upon the discretion of your surgeon and anesthesiologist your surgery may need to be rescheduled.  Do not eat food after midnight the night before surgery.  No gum chewing or hard candies.  You may however, drink CLEAR liquids up to 2 hours before you are scheduled to arrive for your surgery. Do not drink anything within 2 hours of your scheduled arrival time.  Clear liquids include: - water  - apple juice without pulp - gatorade (not RED colors) - black coffee or tea (Do NOT add milk or creamers to the coffee or tea) Do NOT drink anything that is not on this list.   In addition, your doctor has ordered for you to drink the provided:  Ensure Pre-Surgery Clear Carbohydrate Drink  Drinking this carbohydrate drink up to two hours before surgery helps to reduce insulin resistance and improve patient outcomes. Please complete drinking 2 hours before scheduled arrival time.  One week prior to surgery: Stop Anti-inflammatories (NSAIDS) such as Advil, Aleve, Ibuprofen, Motrin, Naproxen, Naprosyn and Aspirin based products such as Excedrin, Goody's Powder, BC Powder. Stop ANY OVER THE COUNTER supplements until after surgery.  You may however, continue to take Tylenol if needed for pain up until the day of surgery.   Continue taking all of your other prescription medications up until the day of surgery.  ON THE DAY OF SURGERY ONLY TAKE THESE MEDICATIONS WITH SIPS OF WATER:  levothyroxine (SYNTHROID) 25 MCG  busPIRone (BUSPAR) 7.5 MG (if needed)   No Alcohol for  24 hours before or after surgery.  No Smoking including e-cigarettes for 24 hours before surgery.  No chewable tobacco products for at least 6 hours before surgery.  No nicotine patches on the day of surgery.  Do not use any "recreational" drugs for at least a week (preferably 2 weeks) before your surgery.  Please be advised that the combination of cocaine and anesthesia may have negative outcomes, up to and including death. If you test positive for cocaine, your surgery will be cancelled.  On the morning of surgery brush your teeth with toothpaste and water, you may rinse your mouth with mouthwash if you wish. Do not swallow any toothpaste or mouthwash.  Use CHG Soap or wipes as directed on instruction sheet.  Do not wear jewelry, make-up, hairpins, clips or nail polish.  For welded (permanent) jewelry: bracelets, anklets, waist bands, etc.  Please have this removed prior to surgery.  If it is not removed, there is a chance that hospital personnel will need to cut it off on the day of surgery.  Do not wear lotions, powders, or perfumes.   Do not shave body hair from the neck down 48 hours before surgery.  Contact lenses, hearing aids and dentures may not be worn into surgery.  Do not bring valuables to the hospital. [facility] is not responsible for any missing/lost belongings or valuables.   Total Shoulder Arthroplasty:  use Benzoyl Peroxide 5% Gel as directed on instruction sheet.  Bring your C-PAP to the hospital in case you may  have to spend the night.   Notify your doctor if there is any change in your medical condition (cold, fever, infection).  Wear comfortable clothing (specific to your surgery type) to the hospital.  After surgery, you can help prevent lung complications by doing breathing exercises.  Take deep breaths and cough every 1-2 hours. Your doctor may order a device called an Incentive Spirometer to help you take deep breaths. When coughing or sneezing, hold  a pillow firmly against your incision with both hands. This is called "splinting." Doing this helps protect your incision. It also decreases belly discomfort.  If you are being admitted to the hospital overnight, leave your suitcase in the car. After surgery it may be brought to your room.  In case of increased patient census, it may be necessary for you, the patient, to continue your postoperative care in the Same Day Surgery department.  If you are being discharged the day of surgery, you will not be allowed to drive home. You will need a responsible individual to drive you home and stay with you for 24 hours after surgery.   If you are taking public transportation, you will need to have a responsible individual with you.  Please call the Pre-admissions Testing Dept. at [phone] if you have any questions about these instructions.  Surgery Visitation Policy:  Patients having surgery or a procedure may have two visitors.  Children under the age of 75 must have an adult with them who is not the patient.  Inpatient Visitation:    Visiting hours are 7 a.m. to 8 p.m. Up to four visitors are allowed at one time in a patient room. The visitors may rotate out with other people during the day.  One visitor age 22 or older may stay with the patient overnight and must be in the room by 8 p.m.    Pre-operative 5 CHG Bath Instructions   You can play a key role in reducing the risk of infection after surgery. Your skin needs to be as free of germs as possible. You can reduce the number of germs on your skin by washing with CHG (chlorhexidine gluconate) soap before surgery. CHG is an antiseptic soap that kills germs and continues to kill germs even after washing.   DO NOT use if you have an allergy to chlorhexidine/CHG or antibacterial soaps. If your skin becomes reddened or irritated, stop using the CHG and notify one of our RNs at [phone].   Please shower with the CHG soap starting 4  days before surgery using the following schedule:     Please keep in mind the following:  DO NOT shave, including legs and underarms, starting the day of your first shower.   You may shave your face at any point before/day of surgery.  Place clean sheets on your bed the day you start using CHG soap. Use a clean washcloth (not used since being washed) for each shower. DO NOT sleep with pets once you start using the CHG.   CHG Shower Instructions:  If you choose to wash your hair and private area, wash first with your normal shampoo/soap.  After you use shampoo/soap, rinse your hair and body thoroughly to remove shampoo/soap residue.  Turn the water OFF and apply about 3 tablespoons (45 ml) of CHG soap to a CLEAN washcloth.  Apply CHG soap ONLY FROM YOUR NECK DOWN TO YOUR TOES (washing for 3-5 minutes)  DO NOT use CHG soap on face, private areas, open wounds,  or sores.  Pay special attention to the area where your surgery is being performed.  If you are having back surgery, having someone wash your back for you may be helpful. Wait 2 minutes after CHG soap is applied, then you may rinse off the CHG soap.  Pat dry with a clean towel  Put on clean clothes/pajamas   If you choose to wear lotion, please use ONLY the CHG-compatible lotions on the back of this paper.     Additional instructions for the day of surgery: DO NOT APPLY any lotions, deodorants, cologne, or perfumes.   Put on clean/comfortable clothes.  Brush your teeth.  Ask your nurse before applying any prescription medications to the skin.      CHG Compatible Lotions   Aveeno Moisturizing lotion  Cetaphil Moisturizing Cream  Cetaphil Moisturizing Lotion  Clairol Herbal Essence Moisturizing Lotion, Dry Skin  Clairol Herbal Essence Moisturizing Lotion, Extra Dry Skin  Clairol Herbal Essence Moisturizing Lotion, Normal Skin  Curel Age Defying Therapeutic Moisturizing Lotion with Alpha Hydroxy  Curel Extreme Care Body  Lotion  Curel Soothing Hands Moisturizing Hand Lotion  Curel Therapeutic Moisturizing Cream, Fragrance-Free  Curel Therapeutic Moisturizing Lotion, Fragrance-Free  Curel Therapeutic Moisturizing Lotion, Original Formula  Eucerin Daily Replenishing Lotion  Eucerin Dry Skin Therapy Plus Alpha Hydroxy Crme  Eucerin Dry Skin Therapy Plus Alpha Hydroxy Lotion  Eucerin Original Crme  Eucerin Original Lotion  Eucerin Plus Crme Eucerin Plus Lotion  Eucerin TriLipid Replenishing Lotion  Keri Anti-Bacterial Hand Lotion  Keri Deep Conditioning Original Lotion Dry Skin Formula Softly Scented  Keri Deep Conditioning Original Lotion, Fragrance Free Sensitive Skin Formula  Keri Lotion Fast Absorbing Fragrance Free Sensitive Skin Formula  Keri Lotion Fast Absorbing Softly Scented Dry Skin Formula  Keri Original Lotion  Keri Skin Renewal Lotion Keri Silky Smooth Lotion  Keri Silky Smooth Sensitive Skin Lotion  Nivea Body Creamy Conditioning Oil  Nivea Body Extra Enriched Lotion  Nivea Body Original Lotion  Nivea Body Sheer Moisturizing Lotion Nivea Crme  Nivea Skin Firming Lotion  NutraDerm 30 Skin Lotion  NutraDerm Skin Lotion  NutraDerm Therapeutic Skin Cream  NutraDerm Therapeutic Skin Lotion  ProShield Protective Hand Cream  Provon moisturizing lotion  How to Use an Incentive Spirometer  An incentive spirometer is a tool that measures how well you are filling your lungs with each breath. Learning to take long, deep breaths using this tool can help you keep your lungs clear and active. This may help to reverse or lessen your chance of developing breathing (pulmonary) problems, especially infection. You may be asked to use a spirometer: After a surgery. If you have a lung problem or a history of smoking. After a long period of time when you have been unable to move or be active. If the spirometer includes an indicator to show the highest number that you have reached, your health care  provider or respiratory therapist will help you set a goal. Keep a log of your progress as told by your health care provider. What are the risks? Breathing too quickly may cause dizziness or cause you to pass out. Take your time so you do not get dizzy or light-headed. If you are in pain, you may need to take pain medicine before doing incentive spirometry. It is harder to take a deep breath if you are having pain. How to use your incentive spirometer  Sit up on the edge of your bed or on a chair. Hold the incentive spirometer so that  it is in an upright position. Before you use the spirometer, breathe out normally. Place the mouthpiece in your mouth. Make sure your lips are closed tightly around it. Breathe in slowly and as deeply as you can through your mouth, causing the piston or the ball to rise toward the top of the chamber. Hold your breath for 3-5 seconds, or for as long as possible. If the spirometer includes a coach indicator, use this to guide you in breathing. Slow down your breathing if the indicator goes above the marked areas. Remove the mouthpiece from your mouth and breathe out normally. The piston or ball will return to the bottom of the chamber. Rest for a few seconds, then repeat the steps 10 or more times. Take your time and take a few normal breaths between deep breaths so that you do not get dizzy or light-headed. Do this every 1-2 hours when you are awake. If the spirometer includes a goal marker to show the highest number you have reached (best effort), use this as a goal to work toward during each repetition. After each set of 10 deep breaths, cough a few times. This will help to make sure that your lungs are clear. If you have an incision on your chest or abdomen from surgery, place a pillow or a rolled-up towel firmly against the incision when you cough. This can help to reduce pain while taking deep breaths and coughing. General tips When you are able to get out of  bed: Walk around often. Continue to take deep breaths and cough in order to clear your lungs. Keep using the incentive spirometer until your health care provider says it is okay to stop using it. If you have been in the hospital, you may be told to keep using the spirometer at home. Contact a health care provider if: You are having difficulty using the spirometer. You have trouble using the spirometer as often as instructed. Your pain medicine is not giving enough relief for you to use the spirometer as told. You have a fever. Get help right away if: You develop shortness of breath. You develop a cough with bloody mucus from the lungs. You have fluid or blood coming from an incision site after you cough. Summary An incentive spirometer is a tool that can help you learn to take long, deep breaths to keep your lungs clear and active. You may be asked to use a spirometer after a surgery, if you have a lung problem or a history of smoking, or if you have been inactive for a long period of time. Use your incentive spirometer as instructed every 1-2 hours while you are awake. If you have an incision on your chest or abdomen, place a pillow or a rolled-up towel firmly against your incision when you cough. This will help to reduce pain. Get help right away if you have shortness of breath, you cough up bloody mucus, or blood comes from your incision when you cough. This information is not intended to replace advice given to you by your health care provider. Make sure you discuss any questions you have with your health care provider. Document Revised: 11/08/2019 Document Reviewed: 11/08/2019 Elsevier Patient Education  2023 [employer].   Please go to the following website to access important education materials concerning your upcoming joint replacement.  [email or web address]

## 2023-07-02 LAB — URINE CULTURE: Culture: <10000 — AB

## 2023-07-03 DIAGNOSIS — M25462 Effusion, left knee: Secondary | ICD-10-CM | POA: Diagnosis not present

## 2023-07-03 DIAGNOSIS — M25562 Pain in left knee: Secondary | ICD-10-CM | POA: Diagnosis not present

## 2023-07-03 DIAGNOSIS — G8929 Other chronic pain: Secondary | ICD-10-CM | POA: Diagnosis not present

## 2023-07-03 DIAGNOSIS — N3000 Acute cystitis without hematuria: Secondary | ICD-10-CM | POA: Diagnosis not present

## 2023-07-03 DIAGNOSIS — M1712 Unilateral primary osteoarthritis, left knee: Secondary | ICD-10-CM | POA: Diagnosis not present

## 2023-07-07 MED ORDER — LACTATED RINGERS IV SOLN
INTRAVENOUS | Status: DC
Start: 2023-07-07 — End: 2023-07-08

## 2023-07-07 MED ORDER — CEFAZOLIN SODIUM-DEXTROSE 2-4 GM/100ML-% IV SOLN
2.0000 g | INTRAVENOUS | Status: AC
  Administered 2023-07-08: 2 g via INTRAVENOUS

## 2023-07-07 MED ORDER — ORAL CARE MOUTH RINSE: 15.0000 mL | Freq: Once | OROMUCOSAL | Status: AC

## 2023-07-07 MED ORDER — TRANEXAMIC ACID-NACL 1000-0.7 MG/100ML-% IV SOLN
1000.0000 mg | INTRAVENOUS | Status: AC
  Administered 2023-07-08: 1000 mg via INTRAVENOUS

## 2023-07-07 MED ORDER — CHLORHEXIDINE GLUCONATE 0.12 % MT SOLN
15.0000 mL | Freq: Once | OROMUCOSAL | Status: AC
Start: 2023-07-07 — End: 2023-07-08
  Administered 2023-07-08: 15 mL via OROMUCOSAL

## 2023-07-08 ENCOUNTER — Ambulatory Visit: Payer: Medicare Other | Admitting: Urgent Care

## 2023-07-08 ENCOUNTER — Encounter: Payer: Self-pay | Admitting: Surgery

## 2023-07-08 ENCOUNTER — Other Ambulatory Visit: Payer: Self-pay

## 2023-07-08 ENCOUNTER — Ambulatory Visit: Payer: Medicare Other | Admitting: Anesthesiology

## 2023-07-08 ENCOUNTER — Ambulatory Visit
Admission: RE | Admit: 2023-07-08 | Discharge: 2023-07-09 | Disposition: A | Discharge status: Home / Self Care | Payer: Medicare Other | Attending: Surgery | Admitting: Surgery

## 2023-07-08 ENCOUNTER — Ambulatory Visit: Payer: Medicare Other

## 2023-07-08 ENCOUNTER — Encounter
Admission: RE | Disposition: A | Discharge status: Home / Self Care | Payer: Self-pay | Source: Home / Self Care | Attending: Surgery

## 2023-07-08 DIAGNOSIS — Z96652 Presence of left artificial knee joint: Secondary | ICD-10-CM

## 2023-07-08 DIAGNOSIS — M8580 Other specified disorders of bone density and structure, unspecified site: Secondary | ICD-10-CM | POA: Insufficient documentation

## 2023-07-08 DIAGNOSIS — R609 Edema, unspecified: Secondary | ICD-10-CM | POA: Diagnosis not present

## 2023-07-08 DIAGNOSIS — Z471 Aftercare following joint replacement surgery: Secondary | ICD-10-CM | POA: Diagnosis not present

## 2023-07-08 DIAGNOSIS — M1712 Unilateral primary osteoarthritis, left knee: Secondary | ICD-10-CM | POA: Diagnosis not present

## 2023-07-08 DIAGNOSIS — E039 Hypothyroidism, unspecified: Secondary | ICD-10-CM | POA: Insufficient documentation

## 2023-07-08 DIAGNOSIS — M170 Bilateral primary osteoarthritis of knee: Secondary | ICD-10-CM | POA: Insufficient documentation

## 2023-07-08 DIAGNOSIS — Z79890 Hormone replacement therapy (postmenopausal): Secondary | ICD-10-CM | POA: Diagnosis not present

## 2023-07-08 DIAGNOSIS — K219 Gastro-esophageal reflux disease without esophagitis: Secondary | ICD-10-CM | POA: Diagnosis not present

## 2023-07-08 HISTORY — PX: TOTAL KNEE ARTHROPLASTY: SHX125

## 2023-07-08 SURGERY — ARTHROPLASTY, KNEE, TOTAL
Anesthesia: Spinal | Site: Knee | Laterality: Left

## 2023-07-08 MED ORDER — CEFAZOLIN SODIUM-DEXTROSE 2-4 GM/100ML-% IV SOLN
INTRAVENOUS | Status: AC
Start: 2023-07-08 — End: ?
  Filled 2023-07-08: qty 100

## 2023-07-08 MED ORDER — APIXABAN 2.5 MG PO TABS: 2.5000 mg | ORAL_TABLET | Freq: Two times a day (BID) | ORAL | 0 refills | Status: DC

## 2023-07-08 MED ORDER — BUSPIRONE HCL 7.5 MG PO TABS: 7.5000 mg | ORAL_TABLET | Freq: Three times a day (TID) | ORAL | Status: DC | PRN

## 2023-07-08 MED ORDER — STERILE WATER FOR IRRIGATION IR SOLN
Status: DC | PRN
  Administered 2023-07-08: 1000 mL

## 2023-07-08 MED ORDER — FENTANYL CITRATE (PF) 100 MCG/2ML IJ SOLN: 25.0000 ug | INTRAMUSCULAR | Status: DC | PRN

## 2023-07-08 MED ORDER — ONDANSETRON HCL 4 MG/2ML IJ SOLN: 4.0000 mg | Freq: Four times a day (QID) | INTRAMUSCULAR | Status: DC | PRN

## 2023-07-08 MED ORDER — FLEET ENEMA RE ENEM: 1.0000 | ENEMA | Freq: Once | RECTAL | Status: DC | PRN

## 2023-07-08 MED ORDER — LIDOCAINE HCL (PF) 2 % IJ SOLN
INTRAMUSCULAR | Status: AC
Start: 2023-07-08 — End: ?
  Filled 2023-07-08: qty 5

## 2023-07-08 MED ORDER — DIPHENHYDRAMINE HCL 12.5 MG/5ML PO ELIX: 12.5000 mg | ORAL_SOLUTION | ORAL | Status: DC | PRN

## 2023-07-08 MED ORDER — EPHEDRINE SULFATE-NACL 50-0.9 MG/10ML-% IV SOSY
PREFILLED_SYRINGE | INTRAVENOUS | Status: DC | PRN
  Administered 2023-07-08: 5 mg via INTRAVENOUS

## 2023-07-08 MED ORDER — LEVOTHYROXINE SODIUM 50 MCG PO TABS: 50.0000 ug | ORAL_TABLET | ORAL | Status: DC

## 2023-07-08 MED ORDER — ONDANSETRON HCL 4 MG/2ML IJ SOLN
INTRAMUSCULAR | Status: AC
  Filled 2023-07-08: qty 2

## 2023-07-08 MED ORDER — OXYCODONE HCL 5 MG/5ML PO SOLN: 5.0000 mg | Freq: Once | ORAL | Status: DC | PRN

## 2023-07-08 MED ORDER — BUPIVACAINE HCL (PF) 0.5 % IJ SOLN
INTRAMUSCULAR | Status: DC | PRN
  Administered 2023-07-08: 2.5 mL

## 2023-07-08 MED ORDER — TRANEXAMIC ACID-NACL 1000-0.7 MG/100ML-% IV SOLN
INTRAVENOUS | Status: AC
  Filled 2023-07-08: qty 100

## 2023-07-08 MED ORDER — VITAMIN D 25 MCG (1000 UNIT) PO TABS
1000.0000 [IU] | ORAL_TABLET | Freq: Every morning | ORAL | Status: DC
  Administered 2023-07-09: 1000 [IU] via ORAL

## 2023-07-08 MED ORDER — DEXTROSE-SODIUM CHLORIDE 5-0.9 % IV SOLN: INTRAVENOUS | Status: DC

## 2023-07-08 MED ORDER — LEVOTHYROXINE SODIUM 25 MCG PO TABS
25.0000 ug | ORAL_TABLET | ORAL | Status: DC
  Administered 2023-07-09: 25 ug via ORAL
  Filled 2023-07-08: qty 1

## 2023-07-08 MED ORDER — PROPOFOL 10 MG/ML IV BOLUS
INTRAVENOUS | Status: AC
  Filled 2023-07-08: qty 40

## 2023-07-08 MED ORDER — LIDOCAINE HCL (CARDIAC) PF 100 MG/5ML IV SOSY
PREFILLED_SYRINGE | INTRAVENOUS | Status: DC | PRN
  Administered 2023-07-08: 25 mg via INTRAVENOUS

## 2023-07-08 MED ORDER — KETOROLAC TROMETHAMINE 30 MG/ML IJ SOLN
INTRAMUSCULAR | Status: AC
Start: 2023-07-08 — End: ?
  Filled 2023-07-08: qty 1

## 2023-07-08 MED ORDER — METOCLOPRAMIDE HCL 5 MG/ML IJ SOLN: 5.0000 mg | Freq: Three times a day (TID) | INTRAMUSCULAR | Status: DC | PRN

## 2023-07-08 MED ORDER — ACETAMINOPHEN 325 MG PO TABS: 325.0000 mg | ORAL_TABLET | Freq: Four times a day (QID) | ORAL | Status: DC | PRN

## 2023-07-08 MED ORDER — DOCUSATE SODIUM 100 MG PO CAPS
100.0000 mg | ORAL_CAPSULE | Freq: Two times a day (BID) | ORAL | Status: DC
  Administered 2023-07-09: 100 mg via ORAL

## 2023-07-08 MED ORDER — FENTANYL CITRATE (PF) 100 MCG/2ML IJ SOLN
INTRAMUSCULAR | Status: DC | PRN
  Administered 2023-07-08: 50 ug via INTRAVENOUS

## 2023-07-08 MED ORDER — TRIAMCINOLONE ACETONIDE 0.1 % EX CREA: 1.0000 | TOPICAL_CREAM | Freq: Two times a day (BID) | CUTANEOUS | Status: AC | PRN

## 2023-07-08 MED ORDER — APIXABAN 2.5 MG PO TABS
2.5000 mg | ORAL_TABLET | Freq: Two times a day (BID) | ORAL | Status: DC
  Administered 2023-07-09: 2.5 mg via ORAL

## 2023-07-08 MED ORDER — ACETAMINOPHEN 10 MG/ML IV SOLN
INTRAVENOUS | Status: AC
  Filled 2023-07-08: qty 100

## 2023-07-08 MED ORDER — BISACODYL 10 MG RE SUPP: 10.0000 mg | Freq: Every day | RECTAL | Status: DC | PRN

## 2023-07-08 MED ORDER — PHENYLEPHRINE HCL-NACL 20-0.9 MG/250ML-% IV SOLN
INTRAVENOUS | Status: AC
  Filled 2023-07-08: qty 250

## 2023-07-08 MED ORDER — ONDANSETRON HCL 4 MG/2ML IJ SOLN
INTRAMUSCULAR | Status: DC | PRN
  Administered 2023-07-08: 4 mg via INTRAVENOUS

## 2023-07-08 MED ORDER — FENTANYL CITRATE (PF) 100 MCG/2ML IJ SOLN
INTRAMUSCULAR | Status: AC
  Filled 2023-07-08: qty 2

## 2023-07-08 MED ORDER — SODIUM CHLORIDE 0.9 % BOLUS PEDS
250.0000 mL | Freq: Once | INTRAVENOUS | Status: AC
  Administered 2023-07-08: 250 mL via INTRAVENOUS

## 2023-07-08 MED ORDER — HYDROCODONE-ACETAMINOPHEN 5-325 MG PO TABS
1.0000 | ORAL_TABLET | ORAL | Status: DC | PRN
  Administered 2023-07-08: 1 via ORAL

## 2023-07-08 MED ORDER — CEFAZOLIN SODIUM-DEXTROSE 2-4 GM/100ML-% IV SOLN
INTRAVENOUS | Status: AC
  Filled 2023-07-08: qty 100

## 2023-07-08 MED ORDER — ACETAMINOPHEN 10 MG/ML IV SOLN
INTRAVENOUS | Status: DC | PRN
  Administered 2023-07-08: 1000 mg via INTRAVENOUS

## 2023-07-08 MED ORDER — HYDROCODONE-ACETAMINOPHEN 5-325 MG PO TABS
ORAL_TABLET | ORAL | Status: AC
  Filled 2023-07-08: qty 1

## 2023-07-08 MED ORDER — LEVOTHYROXINE SODIUM 25 MCG PO TABS: 25.0000 ug | ORAL_TABLET | ORAL | Status: DC

## 2023-07-08 MED ORDER — PROPOFOL 10 MG/ML IV BOLUS
INTRAVENOUS | Status: DC | PRN
  Administered 2023-07-08 (×2): 20 mg via INTRAVENOUS

## 2023-07-08 MED ORDER — TRIAMCINOLONE ACETONIDE 40 MG/ML IJ SUSP
INTRAMUSCULAR | Status: DC | PRN
  Administered 2023-07-08: 93 mL

## 2023-07-08 MED ORDER — BUPIVACAINE LIPOSOME 1.3 % IJ SUSP
INTRAMUSCULAR | Status: AC
  Filled 2023-07-08: qty 20

## 2023-07-08 MED ORDER — HYDROCODONE-ACETAMINOPHEN 5-325 MG PO TABS: 1.0000 | ORAL_TABLET | Freq: Four times a day (QID) | ORAL | 0 refills | Status: DC | PRN

## 2023-07-08 MED ORDER — MAGNESIUM HYDROXIDE 400 MG/5ML PO SUSP: 30.0000 mL | Freq: Every day | ORAL | Status: DC | PRN

## 2023-07-08 MED ORDER — BUPIVACAINE HCL (PF) 0.5 % IJ SOLN
INTRAMUSCULAR | Status: AC
  Filled 2023-07-08: qty 30

## 2023-07-08 MED ORDER — PROPOFOL 500 MG/50ML IV EMUL
INTRAVENOUS | Status: DC | PRN
  Administered 2023-07-08: 75 ug/kg/min via INTRAVENOUS

## 2023-07-08 MED ORDER — METOCLOPRAMIDE HCL 10 MG PO TABS: 5.0000 mg | ORAL_TABLET | Freq: Three times a day (TID) | ORAL | Status: DC | PRN

## 2023-07-08 MED ORDER — EPINEPHRINE PF 1 MG/ML IJ SOLN
INTRAMUSCULAR | Status: AC
  Filled 2023-07-08: qty 1

## 2023-07-08 MED ORDER — ONDANSETRON HCL 4 MG PO TABS: 4.0000 mg | ORAL_TABLET | Freq: Four times a day (QID) | ORAL | Status: DC | PRN

## 2023-07-08 MED ORDER — SODIUM CHLORIDE 0.9 % IR SOLN
Status: DC | PRN
  Administered 2023-07-08: 3000 mL

## 2023-07-08 MED ORDER — CHLORHEXIDINE GLUCONATE 0.12 % MT SOLN
OROMUCOSAL | Status: AC
  Filled 2023-07-08: qty 15

## 2023-07-08 MED ORDER — OXYCODONE HCL 5 MG PO TABS: 5.0000 mg | ORAL_TABLET | Freq: Once | ORAL | Status: DC | PRN

## 2023-07-08 MED ORDER — PHENYLEPHRINE HCL-NACL 20-0.9 MG/250ML-% IV SOLN
INTRAVENOUS | Status: DC | PRN
  Administered 2023-07-08: 30 ug/min via INTRAVENOUS

## 2023-07-08 MED ORDER — CEFAZOLIN SODIUM-DEXTROSE 2-4 GM/100ML-% IV SOLN
2.0000 g | Freq: Three times a day (TID) | INTRAVENOUS | Status: AC
  Administered 2023-07-08 (×2): 2 g via INTRAVENOUS

## 2023-07-08 SURGICAL SUPPLY — 64 items
APL PRP STRL LF DISP 70% ISPRP (MISCELLANEOUS) ×1
BLADE SAW 90X13X1.19 OSCILLAT (BLADE) ×1 IMPLANT
BLADE SAW SAG 25X90X1.19 (BLADE) ×1 IMPLANT
BLADE SURG SZ20 CARB STEEL (BLADE) ×1 IMPLANT
BNDG CMPR STD VLCR NS LF 5.8X6 (GAUZE/BANDAGES/DRESSINGS) ×1
BNDG ELASTIC 6X5.8 VLCR NS LF (GAUZE/BANDAGES/DRESSINGS) ×1 IMPLANT
CEMENT BONE R 1X40 (Cement) ×2 IMPLANT
CEMENT VACUUM MIXING SYSTEM (MISCELLANEOUS) ×1 IMPLANT
CHLORAPREP W/TINT 26 (MISCELLANEOUS) ×1 IMPLANT
COMP FEM CMT PS STD 5 LT (Joint) ×1 IMPLANT
COMP TIB PS KNEE D 0D LT (Joint) ×1 IMPLANT
COMPONENT FEM CMT PS STD 5 LT (Joint) IMPLANT
COMPONET TIB PS KNEE D 0D LT (Joint) IMPLANT
COOLER POLAR GLACIER W/PUMP (MISCELLANEOUS) ×1 IMPLANT
COVER MAYO STAND STRL (DRAPES) ×1 IMPLANT
CUFF TOURN SGL QUICK 24 (TOURNIQUET CUFF)
CUFF TOURN SGL QUICK 34 (TOURNIQUET CUFF)
CUFF TRNQT CYL 24X4X16.5-23 (TOURNIQUET CUFF) IMPLANT
CUFF TRNQT CYL 34X4.125X (TOURNIQUET CUFF) IMPLANT
DRAPE IMP U-DRAPE 54X76 (DRAPES) ×1 IMPLANT
DRAPE SHEET LG 3/4 BI-LAMINATE (DRAPES) ×1 IMPLANT
DRAPE U-SHAPE 47X51 STRL (DRAPES) ×1 IMPLANT
DRSG MEPILEX SACRM 8.7X9.8 (GAUZE/BANDAGES/DRESSINGS) IMPLANT
DRSG OPSITE POSTOP 4X10 (GAUZE/BANDAGES/DRESSINGS) ×1 IMPLANT
DRSG OPSITE POSTOP 4X8 (GAUZE/BANDAGES/DRESSINGS) ×1 IMPLANT
ELECT CAUTERY BLADE 6.4 (BLADE) ×1 IMPLANT
ELECT REM PT RETURN 9FT ADLT (ELECTROSURGICAL) ×1
ELECTRODE REM PT RTRN 9FT ADLT (ELECTROSURGICAL) ×1 IMPLANT
GAUZE XEROFORM 1X8 LF (GAUZE/BANDAGES/DRESSINGS) ×1 IMPLANT
GLOVE BIO SURGEON STRL SZ7.5 (GLOVE) ×4 IMPLANT
GLOVE BIO SURGEON STRL SZ8 (GLOVE) ×4 IMPLANT
GLOVE BIOGEL PI IND STRL 8 (GLOVE) ×1 IMPLANT
GLOVE INDICATOR 8.0 STRL GRN (GLOVE) ×1 IMPLANT
GOWN STRL REUS W/ TWL LRG LVL3 (GOWN DISPOSABLE) ×1 IMPLANT
GOWN STRL REUS W/ TWL XL LVL3 (GOWN DISPOSABLE) ×1 IMPLANT
GOWN STRL REUS W/TWL LRG LVL3 (GOWN DISPOSABLE) ×1
GOWN STRL REUS W/TWL XL LVL3 (GOWN DISPOSABLE) ×1
HANDLE YANKAUER SUCT OPEN TIP (MISCELLANEOUS) ×1 IMPLANT
HOOD PEEL AWAY T7 (MISCELLANEOUS) ×3 IMPLANT
INSERT ASF PERS 11 CD/4-5 LT (Insert) IMPLANT
IV NS IRRIG 3000ML ARTHROMATIC (IV SOLUTION) ×1 IMPLANT
KIT TURNOVER KIT A (KITS) ×1 IMPLANT
MANIFOLD NEPTUNE II (INSTRUMENTS) ×1 IMPLANT
NDL SPNL 20GX3.5 QUINCKE YW (NEEDLE) ×1 IMPLANT
NEEDLE SPNL 20GX3.5 QUINCKE YW (NEEDLE) ×1 IMPLANT
NS IRRIG 1000ML POUR BTL (IV SOLUTION) ×1 IMPLANT
PACK TOTAL KNEE (MISCELLANEOUS) ×1 IMPLANT
PAD WRAPON POLAR KNEE (MISCELLANEOUS) ×1 IMPLANT
PENCIL SMOKE EVACUATOR (MISCELLANEOUS) ×1 IMPLANT
PIN DRILL HDLS TROCAR 75 4PK (PIN) IMPLANT
PULSAVAC PLUS IRRIG FAN TIP (DISPOSABLE) ×1
SCREW FEMALE HEX FIX 25X2.5 (ORTHOPEDIC DISPOSABLE SUPPLIES) IMPLANT
STAPLER SKIN PROX 35W (STAPLE) ×1 IMPLANT
SUCTION TUBE FRAZIER 10FR DISP (SUCTIONS) ×1 IMPLANT
SUT VIC AB 0 CT1 36 (SUTURE) ×3 IMPLANT
SUT VIC AB 2-0 CT1 27 (SUTURE) ×3
SUT VIC AB 2-0 CT1 TAPERPNT 27 (SUTURE) ×3 IMPLANT
SYR 10ML LL (SYRINGE) ×1 IMPLANT
SYR 20ML LL LF (SYRINGE) ×1 IMPLANT
SYR 30ML LL (SYRINGE) IMPLANT
TIP FAN IRRIG PULSAVAC PLUS (DISPOSABLE) ×1 IMPLANT
TRAP FLUID SMOKE EVACUATOR (MISCELLANEOUS) ×2 IMPLANT
WATER STERILE IRR 500ML POUR (IV SOLUTION) ×1 IMPLANT
WRAPON POLAR PAD KNEE (MISCELLANEOUS) ×1

## 2023-07-08 NOTE — Transfer of Care (Signed)
Immediate Anesthesia Transfer of Care Note  Patient: Kimberly Rojas  Procedure(s) Performed: TOTAL KNEE ARTHROPLASTY (Left: Knee)  Patient Location: PACU  Anesthesia Type:Spinal  Level of Consciousness: awake  Airway & Oxygen Therapy: Patient Spontanous Breathing and Patient connected to face mask oxygen  Post-op Assessment: Report given to RN and Post -op Vital signs reviewed and stable  Post vital signs: Reviewed and stable  Last Vitals:  Vitals Value Taken Time  BP 110/62 07/08/23 1015  Temp    Pulse 67 07/08/23 1019  Resp 15 07/08/23 1019  SpO2 98 % 07/08/23 1019  Vitals shown include unfiled device data.  Last Pain:  Vitals:   07/08/23 0639  TempSrc: Temporal  PainSc: 0-No pain         Complications: No notable events documented.

## 2023-07-08 NOTE — Progress Notes (Signed)
Physical Therapy Evaluation Patient Details Name: Kimberly Rojas MRN: [id number] DOB: Jun 03, 1946 Today's Date: 07/08/2023  History of Present Illness  Kimberly Rojas is a 77 y.o. female, patient is s/p Left TKA. At time of evaluation, patient is POD 0.  Clinical Impression  Orders Received. Chart Reviewed. At time of evaluation, patient is s/p Left TKA POD 0. Patient agreeable to PT evaluation this date/time. Prior to admission, patient was IND with mobility and ADLs.  Did utilize [facility] intermittent and ambulation distance was limited due to pain in L Knee. Patient lives alone with two 3 steps to enter single level home. Son will be available to assist upon discharge. On evaluation, patient was able to complete bed mobility with supervision. Patient require CGA to stand from EOB with RW. Limited weight shift onto LLE but improvements noted with prolonged standing. Patient able to ambulate approx 50 ft with RW, mainly CGA throughout with cues for BOS but did require one episode of Min A due to imbalance/mild buckle in L Knee. Unable to trial stairs this date due to safety concerns. Will benefit from trial at next session if appropriate. Patient requesting to return to bed. PT educating on positioning and reviewed Total Knee HEP with patient. Patient verbalized and demonstrated understanding. RN notified of mobility status and patient positioning. Patient will benefit from skilled acute PT services to address functional impairments (see below for additional) and maximize functional mobility to return to PLOF. Anticipate the need for follow up PT services upon acute hospital discharge. Will continue to follow acutely.        If plan is discharge home, recommend the following: A little help with walking and/or transfers;Help with stairs or ramp for entrance;Assist for transportation   Can travel by private vehicle        Equipment Recommendations Other (comment) (Equipment has already been ordered.  RW and BSC)  Recommendations for Other Services       Functional Status Assessment Patient has had a recent decline in their functional status and demonstrates the ability to make significant improvements in function in a reasonable and predictable amount of time.     Precautions / Restrictions Precautions Precautions: Knee Precaution Booklet Issued: Yes (comment) Precaution Comments: Educated on proper positioning of Knee/Precautions. Restrictions Weight Bearing Restrictions: Yes LLE Weight Bearing: Weight bearing as tolerated      Mobility  Bed Mobility Overal bed mobility: Needs Assistance Bed Mobility: Supine to Sit, Sit to Supine     Supine to sit: Supervision Sit to supine: Supervision   General bed mobility comments: no physical assistance required to get LLE onto/off bed; did use rail. Increased time required    Transfers Overall transfer level: Needs assistance Equipment used: Rolling walker (2 wheels) Transfers: Sit to/from Stand Sit to Stand: Contact guard assist           General transfer comment: Patient able to stand from EOB with CGA, unsteadiness noted upon standing. Denies lightheadedness/dizziness.    Ambulation/Gait Ambulation/Gait assistance: Contact guard assist, Min assist Gait Distance (Feet): 50 Feet Assistive device: Rolling walker (2 wheels) Gait Pattern/deviations: Step-through pattern, Decreased stance time - left, Decreased step length - right, Decreased step length - left, Narrow base of support Gait velocity: Decreased     General Gait Details: Pt able to ambulate 50 ft with use of RW, CGA throughout but did require one episode of Min A due to knee buckling. Narrow BOS requiring verbal cues to widen BOS to promote improved stability.  Stairs Stairs:  (unsafe to complete this date/time; will assess next session)          Wheelchair Mobility     Tilt Bed    Modified Rankin (Stroke Patients Only)       Balance Overall  balance assessment: Needs assistance Sitting-balance support: No upper extremity supported, Feet supported Sitting balance-Leahy Scale: Good     Standing balance support: Bilateral upper extremity supported, During functional activity, Reliant on assistive device for balance Standing balance-Leahy Scale: Fair Standing balance comment: narrow BOS, increasd reliance on RW.                             Pertinent Vitals/Pain Pain Assessment Pain Assessment: No/denies pain    Home Living Family/patient expects to be discharged to:: Private residence Living Arrangements: Alone Available Help at Discharge: Family;Available PRN/intermittently Type of Home: House Home Access: Stairs to enter Entrance Stairs-Rails: Right;Left (cannot reach both) Entrance Stairs-Number of Steps: 2-3   Home Layout: One level Home Equipment: Cane - single point Additional Comments: Son reports will be staying with her initially to provide assistance PRN    Prior Function Prior Level of Function : Independent/Modified Independent;Driving             Mobility Comments: Reports was IND prior; did use SPC intermittently. Knee pain limited her tolerance for ambulation ADLs Comments: IND with ADLs/IADLs     Extremity/Trunk Assessment   Upper Extremity Assessment Upper Extremity Assessment: Overall WFL for tasks assessed    Lower Extremity Assessment Lower Extremity Assessment: LLE deficits/detail LLE Sensation: WNL       Communication   Communication Communication: No apparent difficulties  Cognition Arousal: Alert Behavior During Therapy: WFL for tasks assessed/performed Overall Cognitive Status: Within Functional Limits for tasks assessed                                          General Comments      Exercises Total Joint Exercises Goniometric ROM: -3 - 82 Other Exercises Other Exercises: Reviewed Total Knee HEP with patient, patient demonstrated and  completed exercises with proper technique. Denied questions/concerns at this time.   Assessment/Plan    PT Assessment Patient needs continued PT services  PT Problem List Decreased strength;Decreased range of motion;Decreased activity tolerance;Decreased balance;Decreased mobility;Pain       PT Treatment Interventions DME instruction;Gait training;Stair training;Functional mobility training;Therapeutic activities;Therapeutic exercise;Balance training;Neuromuscular re-education;Patient/family education    PT Goals (Current goals can be found in the Care Plan section)  Acute Rehab PT Goals Patient Stated Goal: Get Home and Be Independent PT Goal Formulation: With patient Time For Goal Achievement: 07/22/23 Potential to Achieve Goals: Good    Frequency BID     Co-evaluation               AM-PAC PT "6 Clicks" Mobility  Outcome Measure Help needed turning from your back to your side while in a flat bed without using bedrails?: None Help needed moving from lying on your back to sitting on the side of a flat bed without using bedrails?: None Help needed moving to and from a bed to a chair (including a wheelchair)?: A Little Help needed standing up from a chair using your arms (e.g., wheelchair or bedside chair)?: A Little Help needed to walk in hospital room?: A Little Help needed climbing 3-5 steps  with a railing? : A Lot 6 Click Score: 19    End of Session Equipment Utilized During Treatment: Gait belt Activity Tolerance: Patient limited by fatigue Patient left: in bed;with call bell/phone within reach;with family/visitor present Nurse Communication: Mobility status;Weight bearing status PT Visit Diagnosis: Unsteadiness on feet (R26.81);Other abnormalities of gait and mobility (R26.89);Difficulty in walking, not elsewhere classified (R26.2);Pain Pain - Right/Left: Left Pain - part of body: Knee    Time: 1355-1430 PT Time Calculation (min) (ACUTE ONLY): 35  min   Charges:   PT Evaluation $PT Eval Low Complexity: 1 Low PT Treatments $Therapeutic Exercise: 8-22 mins PT General Charges $$ ACUTE PT VISIT: 1 Visit         Creed Copper Fairly, PT, DPT 07/08/23 3:08 PM

## 2023-07-08 NOTE — TOC Initial Note (Signed)
Transition of Care [facility]) - Initial/Assessment Note    Patient Details  Name: Kimberly Rojas MRN: [id number] Date of Birth: 22-Aug-1946  Transition of Care [facility]) CM/SW Contact:    Marlowe Sax, RN Phone Number: 07/08/2023, 11:18 AM  Clinical Narrative:                 The patient was set up prior to surgery by surgeons office with [facility] for [facility] services, I requested a RW and 3 in 1 from [employer] [facility] [place]) Will be delivered to the bedside  Expected Discharge Plan: Home w Home Health Services Barriers to Discharge: No Barriers Identified   Patient Goals and CMS Choice            Expected Discharge Plan and Services   Discharge Planning Services: CM Consult   Living arrangements for the past 2 months: Single Family Home                 DME Arranged: Walker rolling, 3-N-1 DME Agency: [facility] Date DME Agency Contacted: 07/08/23 Time DME Agency Contacted: 1117 Representative spoke with at DME Agency: [facility] [place] HH Arranged: PT, OT HH Agency: [facility] Date [facility] Agency Contacted: 07/08/23 Time HH Agency Contacted: 1117 Representative spoke with at [facility] Agency: [place]  Prior Living Arrangements/Services Living arrangements for the past 2 months: Single Family Home   Patient language and need for interpreter reviewed:: Yes Do you feel safe going back to the place where you live?: Yes      Need for Family Participation in Patient Care: Yes (Comment) Care giver support system in place?: Yes (comment)   Criminal Activity/Legal Involvement Pertinent to Current Situation/Hospitalization: No - Comment as needed  Activities of Daily Living      Permission Sought/Granted   Permission granted to share information with : Yes, Verbal Permission Granted              Emotional Assessment Appearance:: Appears stated age Attitude/Demeanor/Rapport: Engaged Affect (typically observed): Pleasant Orientation: : Oriented to Self, Oriented to Place, Oriented to  Time,  Oriented to Situation Alcohol / Substance Use: Not Applicable Psych Involvement: No (comment)  Admission diagnosis:  PRIMARY OSTEOARTHRITIS OF LEFT KNEE Patient Active Problem List   Diagnosis Date Noted   Venous stasis dermatitis of left lower extremity 02/18/2023   Gastroesophageal reflux disease 12/02/2022   S/P reverse total shoulder arthroplasty, left 05/04/2022   Unintentional weight loss 05/04/2022   Thoracic aortic atherosclerosis (HCC) 07/31/2021   Interstitial lung disease (HCC) 06/26/2021   B12 deficiency 10/10/2020   Varicose veins of both lower extremities without ulcer or inflammation 02/04/2020   Complex tear of lateral meniscus of left knee as current injury 02/01/2019   Primary osteoarthritis of left knee 02/01/2019   SVT (supraventricular tachycardia) (HCC) 01/30/2019   Hiatal hernia 11/29/2017   GERD with esophagitis 09/20/2017   Generalized anxiety disorder 09/20/2017   White coat syndrome without diagnosis of hypertension 05/21/2017   Osteopenia 01/09/2017   Encounter for preventive health examination 11/05/2015   Subclinical hypothyroidism 11/23/2014   Pure hypercholesterolemia 11/23/2014   PCP:  Sherlene Shams, MD Pharmacy:   [id number] [place], [place] - [address] AT [facility] [address] [place] [address] Phone: [phone] Fax: [phone]     Social Determinants of Health (SDOH) Social History: SDOH Screenings   Food Insecurity: No Food Insecurity (06/11/2022)  Housing: Low Risk  (06/11/2022)  Transportation Needs: No Transportation  Needs (06/11/2022)  Utilities: Not At Risk (06/11/2022)  Depression (PHQ2-9): Low Risk  (02/18/2023)  Financial Resource Strain: Low Risk  (06/11/2022)  Physical Activity: Insufficiently Active (06/11/2022)  Social Connections: Unknown (06/11/2022)  Stress: No Stress Concern Present (06/11/2022)  Tobacco Use: Low Risk  (07/08/2023)   SDOH  Interventions:     Readmission Risk Interventions     No data to display

## 2023-07-08 NOTE — Anesthesia Procedure Notes (Signed)
Procedure Name: MAC Date/Time: 07/08/2023 8:11 AM  Performed by: Elisabeth Pigeon, CRNAPre-anesthesia Checklist: Patient identified, Emergency Drugs available, Suction available, Patient being monitored and Timeout performed Oxygen Delivery Method: Simple face mask

## 2023-07-08 NOTE — Op Note (Signed)
07/08/2023  10:22 AM  Patient:   Kimberly Rojas  Pre-Op Diagnosis:   Degenerative joint disease, left knee.  Post-Op Diagnosis:   Same  Procedure:   Left TKA using all-cemented Zimmer Persona system with a #5 PCR femur and a D-sized  tibial tray with an  11 mm medial congruent E-poly insert.  Surgeon:   Maryagnes Amos, MD  Assistant:   Horris Latino, PA-C   Anesthesia:   GET  Findings:   As above  Complications:   None  EBL:   5 cc  Fluids:   500 cc crystalloid  UOP:   None  TT:   80 minutes at 300 mmHg  Drains:   None  Closure:   Staples  Implants:   As above  Brief Clinical Note:   The patient is a 77 year old female with a long history of progressively worsening left knee pain. The patient's symptoms have progressed despite medications, activity modification, injections, etc. The patient's history and examination were consistent with advanced degenerative joint disease of the left knee confirmed by plain radiographs. The patient presents at this time for a left total knee arthroplasty.  Procedure:   The patient was brought into the operating room. After adequate spinal anesthesia was obtained, the patient was repositioned in the supine position on the operating room table. The left lower extremity was prepped with ChloraPrep solution and draped sterilely. Preoperative antibiotics were administered. A timeout was performed to verify the appropriate surgical site before the limb was exsanguinated with an Esmarch and the tourniquet inflated to 300 mmHg.   A standard anterior approach to the knee was made through an approximately 6-7 inch incision. The incision was carried down through the subcutaneous tissues to expose superficial retinaculum. This was split the length of the incision and the medial flap elevated sufficiently to expose the medial retinaculum. The medial retinaculum was incised, leaving a 3-4 mm cuff of tissue on the patella. This was extended distally along  the medial border of the patellar tendon and proximally through the medial third of the quadriceps tendon. A subtotal fat pad excision was performed before the soft tissues were elevated off the anteromedial and anterolateral aspects of the proximal tibia to the level of the collateral ligaments. The anterior portions of the medial and lateral menisci were removed, as was the anterior cruciate ligament. With the knee flexed to 90, the external tibial guide was positioned and the appropriate proximal tibial cut made. This piece was taken to the back table where it was measured and found to be optimally replicated by a D-sized component.  Attention was directed to the distal femur. The intramedullary canal was accessed through a 3/8" drill hole. The intramedullary guide was inserted and positioned in order to obtain a neutral flexion gap. The distal cutting block was placed at 5 of valgus alignment. Using the +0 slot, the distal cut was made. The distal femur was measured and found to be optimally replicated by the #5 component. The #5 4-in-1 cutting block was positioned and first the posterior, then the posterior chamfer, the anterior chamfer, and finally the anterior cuts were made after verifying that the anterior cortex would not be notched.   At this point, the posterior portions medial and lateral menisci were removed. A trial reduction was performed using the appropriate femoral and tibial components with  first the 10 mm and then the 11 mm  insert. The 11 mm insert demonstrated excellent stability to varus and valgus stressing both  in flexion and extension while permitting full extension. Patellar tracking was assessed and found to be excellent. Therefore, the tibial trial position was marked on the proximal tibia. The patella thickness was measured and found to be 16 mm. Given how thin the patella was and given the fact that the articular surface of the patella was still in satisfactory condition, it  was felt best not to try to resurface the patella for fear of overstuffing the patellofemoral compartment. Patella tracking was assessed and found to be excellent, passing the "no thumb test". The lug holes were drilled into the distal femur before the trial component was removed. The tibial tray was repositioned before the keel was created using the appropriate tower, reamer, and punch.  The bony surfaces were prepared for cementing by irrigating them thoroughly with sterile saline solution via the jet lavage system. A bone plug was fashioned from some of the bone that had been removed previously and used to plug the distal femoral canal. In addition, a "cocktail" of 20 cc of Exparel, 30 cc of 0.5% Sensorcaine, 2 cc of Kenalog 40 (80 mg), and 30 mg of Toradol diluted out to 90 cc with normal saline was injected into the postero-medial and postero-lateral aspects of the knee, the medial and lateral gutter regions, and the peri-incisional tissues to help with postoperative analgesia. Meanwhile, the cement was being mixed on the back table.   When the cement was ready, the tibial tray was cemented in first. The excess cement was removed using Personal assistant. Next, the femoral component was impacted into place. Again, the excess cement was removed using Personal assistant. The  11 mm  trial insert was positioned and the knee brought into extension while the cement hardened. Once the cement had hardened, the knee was placed through a range of motion with the findings as described above. Therefore, the trial insert was removed and, after verifying that no cement had been retained posteriorly, the permanent 11 mm medial congruent E-polyethylene insert was snapped into place with care taken to ensure appropriate locking of the insert. Again the knee was placed through a range of motion with the findings as described above.  The wound was copiously irrigated with sterile saline solution using the jet lavage system  before the quadriceps tendon and retinacular layer were reapproximated using #0 Vicryl interrupted sutures. The superficial retinacular layer also was closed using a running #0 Vicryl suture. The subcutaneous tissues were closed in several layers using 2-0 Vicryl interrupted sutures. The skin was closed using staples. A sterile honeycomb dressing was applied to the skin before the leg was wrapped with an Ace wrap to accommodate the Polar Care device. The patient was then awakened and returned to the recovery room in satisfactory condition after tolerating the procedure well.

## 2023-07-08 NOTE — Anesthesia Preprocedure Evaluation (Addendum)
Anesthesia Evaluation  Patient identified by MRN, date of birth, ID band Patient awake    Reviewed: Allergy & Precautions, NPO status , Patient's Chart, lab work & pertinent test results  Airway Mallampati: III  TM Distance: >3 FB Neck ROM: full    Dental  (+) Partial Upper   Pulmonary neg pulmonary ROS   Pulmonary exam normal        Cardiovascular Normal cardiovascular exam     Neuro/Psych  PSYCHIATRIC DISORDERS Anxiety     negative neurological ROS     GI/Hepatic Neg liver ROS, hiatal hernia, PUD,GERD  Controlled,,  Endo/Other  Hypothyroidism    Renal/GU      Musculoskeletal   Abdominal   Peds  Hematology negative hematology ROS (+)   Anesthesia Other Findings Past Medical History: No date: Anxiety No date: Arthritis No date: Chicken pox No date: Dyspnea     Comment:  denies No date: Dysrhythmia     Comment:  tachy at times No date: Fatigue No date: GERD (gastroesophageal reflux disease) No date: Hair loss No date: Heart murmur     Comment:  hx of  No date: Heartburn No date: Hyperlipidemia No date: Migraines     Comment:  had many years ago, not currently No date: Peptic ulcer No date: Thyroid nodule  Past Surgical History: No date: APPENDECTOMY 2005-2010: BREAST EXCISIONAL BIOPSY; Left     Comment:  benign No date: BREAST SURGERY     Comment:  left breast biopsy  No date: CESAREAN SECTION No date: CHOLECYSTECTOMY No date: DILATION AND CURETTAGE OF UTERUS 01/2022: EYE SURGERY; Bilateral 05/18/2019: KNEE ARTHROSCOPY WITH LATERAL MENISECTOMY; Left     Comment:  Procedure: KNEE ARTHROSCOPY WITH DEBRIDEMENT AND PARTIAL              LATERAL MENISECTOMY;  Surgeon: Christena Flake, MD;                Location: [facility] ORS;  Service: Orthopedics;  Laterality:               Left; 03/26/2022: REVERSE SHOULDER ARTHROPLASTY; Left     Comment:  Procedure: REVERSE SHOULDER ARTHROPLASTY WITH BICEPS                TENODESIS;  Surgeon: Christena Flake, MD;  Location: [facility]               ORS;  Service: Orthopedics;  Laterality: Left; 03/31/2013: SHOULDER OPEN ROTATOR CUFF REPAIR; Right     Comment:  Procedure: RIGHT ROTATOR CUFF REPAIR SHOULDER OPEN;                Surgeon: Jacki Cones, MD;  Location: [facility] ORS;                Service: Orthopedics;  Laterality: Right;  With Patch and              Anchor No date: TONSILLECTOMY AND ADENOIDECTOMY     Reproductive/Obstetrics negative OB ROS                             Anesthesia Physical Anesthesia Plan  ASA: 2  Anesthesia Plan: Spinal   Post-op Pain Management: Regional block*, Ofirmev IV (intra-op)* and Toradol IV (intra-op)*   Induction: Intravenous  PONV Risk Score and Plan: 2 and Propofol infusion, TIVA and Treatment may vary due to age or medical condition  Airway Management Planned: Natural Airway and Nasal Cannula  Additional Equipment:   Intra-op Plan:   Post-operative Plan:   Informed Consent: I have reviewed the patients History and Physical, chart, labs and discussed the procedure including the risks, benefits and alternatives for the proposed anesthesia with the patient or authorized representative who has indicated his/her understanding and acceptance.     Dental Advisory Given  Plan Discussed with: Anesthesiologist, CRNA and Surgeon  Anesthesia Plan Comments: (Patient reports no bleeding problems and no anticoagulant use.  Plan for spinal with backup GA  Patient consented for risks of anesthesia including but not limited to:  - adverse reactions to medications - damage to eyes, teeth, lips or other oral mucosa - nerve damage due to positioning  - risk of bleeding, infection and or nerve damage from spinal that could lead to paralysis - risk of headache or failed spinal - damage to teeth, lips or other oral mucosa - sore throat or hoarseness - damage to heart, brain, nerves, lungs, other  parts of body or loss of life  Patient voiced understanding and assent.)       Anesthesia Quick Evaluation

## 2023-07-08 NOTE — Discharge Instructions (Addendum)
Orthopedic discharge instructions: May shower with intact OpSite dressing. Apply ice frequently to knee or use Polar Care. Start Eliquis 1 tablet (2.5 mg) twice daily on Wednesday, 07/09/2023, for 2 weeks, then take EC-aspirin 325 mg twice daily for 4 weeks. Take pain medication as prescribed when needed.  May supplement with ES Tylenol if necessary. May weight-bear as tolerated on left leg - use walker for balance and support. Follow-up in 10-14 days or as scheduled.  POLAR CARE INFORMATION  MassAdvertisement.it  How to use [employer] Polar Care [facility]?  YouTube   ShippingScam.co.uk  OPERATING INSTRUCTIONS  Start the product With dry hands, connect the transformer to the electrical connection located on the top of the cooler. Next, plug the transformer into an appropriate electrical outlet. The unit will automatically start running at this point.  To stop the pump, disconnect electrical power.  Unplug to stop the product when not in use. Unplugging the Polar Care unit turns it off. Always unplug immediately after use. Never leave it plugged in while unattended. Remove pad.    FIRST ADD WATER TO FILL LINE, THEN ICE---Replace ice when existing ice is almost melted  1 Discuss Treatment with your Licensed Health Care Practitioner and Use Only as Prescribed 2 Apply Insulation Barrier & Cold Therapy Pad 3 Check for Moisture 4 Inspect Skin Regularly  Tips and Trouble Shooting Usage Tips 1. Use cubed or chunked ice for optimal performance. 2. It is recommended to drain the Pad between uses. To drain the pad, hold the Pad upright with the hose pointed toward the ground. Depress the black plunger and allow water to drain out. 3. You may disconnect the Pad from the unit without removing the pad from the affected area by depressing the silver tabs on the hose coupling and gently pulling the hoses apart. The Pad and unit will seal itself and will not leak. Note:  Some dripping during release is normal. 4. DO NOT RUN PUMP WITHOUT WATER! The pump in this unit is designed to run with water. Running the unit without water will cause permanent damage to the pump. 5. Unplug unit before removing lid.  TROUBLESHOOTING GUIDE Pump not running, Water not flowing to the pad, Pad is not getting cold 1. Make sure the transformer is plugged into the wall outlet. 2. Confirm that the ice and water are filled to the indicated levels. 3. Make sure there are no kinks in the pad. 4. Gently pull on the blue tube to make sure the tube/pad junction is straight. 5. Remove the pad from the treatment site and ll it while the pad is lying at; then reapply. 6. Confirm that the pad couplings are securely attached to the unit. Listen for the double clicks (Figure 1) to confirm the pad couplings are securely attached.  Leaks    Note: Some condensation on the lines, controller, and pads is unavoidable, especially in warmer climates. 1. If using a Breg Polar Care Cold Therapy unit with a detachable Cold Therapy Pad, and a leak exists (other than condensation on the lines) disconnect the pad couplings. Make sure the silver tabs on the couplings are depressed before reconnecting the pad to the pump hose; then confirm both sides of the coupling are properly clicked in. 2. If the coupling continues to leak or a leak is detected in the pad itself, stop using it and call [employer] Customer Care at [phone].  Cleaning After use, empty and dry the unit with a soft cloth.  Warm water and mild detergent may be used occasionally to clean the pump and tubes.  WARNING: The Polar Care Cube can be cold enough to cause serious injury, including full skin necrosis. Follow these Operating Instructions, and carefully read the Product Insert (see pouch on side of unit) and the Cold Therapy Pad Fitting Instructions (provided with each Cold Therapy Pad) prior to use.

## 2023-07-08 NOTE — Progress Notes (Signed)
Patient is not able to walk the distance required to go the bathroom, or he/she is unable to safely negotiate stairs required to access the bathroom.  A 3in1 BSC will alleviate this problem  

## 2023-07-08 NOTE — H&P (Signed)
History of Present Illness:  Kimberly Rojas is a 77 y.o. female who presents today for her surgical history and physical for upcoming left total knee arthroplasty scheduled with Dr. Joice Lofts on 07/08/2023. The patient continues to report an aching and throbbing discomfort in the left knee at today's visit. The patient denies any changes in her medical history since her last evaluation. She denies any personal history of heart attack, stroke, asthma or COPD. No personal history of blood clots. The patient is not a diabetic. The patient has undergone several steroid injections in the past without significant relief in addition to viscosupplementation as well. The patient has undergone x-rays which demonstrate significant loss of lateral compartment joint space with underlying osteophyte formation and subchondral sclerosis identified. The patient is not using any assistive devices at this time.  Past Medical History: Borderline hypothyroidism  Hyperlipidemia  Migraines  Osteoarthritis  Peptic ulcer disease (remote)   Past Surgical History: Status post D&C 2003  COLONOSCOPY 04/2011 (Internal hemorrhoids, otherwise normal)  1. Repair of the complete rotator cuff tendon tear, right shoulder utilizing a TissueMend graft and 3 anchors. 2. Open acromionectomy and acromioplasty, right shoulder. 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 Left 05/18/2019 (Dr. 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 Joice Lofts)  APPENDECTOMY  Breast biopsy, left  CHOLECYSTECTOMY   Past Family History: Breast cancer Mother  Aneurysm Mother  Cancer Father  Diabetes Other   Medications: acetaminophen (TYLENOL) 500 MG tablet Take by mouth Take 500 mg by mouth every 6 (six) hours as needed.  artificial tears (GONIOSCOP-HPM) 2.5 % ophthalmic  solution Apply to eye Place 1 drop into both eyes 3 (three) times daily as needed for dry eyes  atorvastatin (LIPITOR) 10 MG tablet 1 tablet Orally Once a day for 30 day(s)  busPIRone (BUSPAR) 7.5 MG tablet Take 1 tablet by mouth as needed  cholecalciferol (VITAMIN D3) 1000 unit tablet Take 2 tablets by mouth once daily  cyanocobalamin (VITAMIN B12) 1000 MCG tablet Take by mouth  docusate (COLACE) 100 MG capsule Take 100 mg by mouth once daily  triamcinolone 0.1 % cream Apply 1 Application topically 2 (two) times daily  amoxicillin (AMOXIL) 875 MG tablet Take 875 mg by mouth every 12 (twelve) hours (Patient not taking: Reported on 06/04/2023)  carboxymethylcellulose (REFRESH TEARS) 0.5 % ophthalmic solution Place 1-2 drops into both eyes as needed for Dry Eyes (Patient not taking: Reported on 07/03/2023)  famotidine (PEPCID) 20 MG tablet Take 20 mg by mouth 2 (two) times daily (Patient not taking: Reported on 07/03/2023)  HYDROcodone-acetaminophen (NORCO) 5-325 mg tablet Take 1 tablet by mouth 2 (two) times daily as needed (Patient not taking: Reported on 06/04/2023) 14 tablet 0  levothyroxine (SYNTHROID) 25 MCG tablet TAKE 1 TABLET BY MOUTH ONCE DAILY IN THE MORNING ON AN EMPTY STOMACH (Patient not taking: Reported on 07/03/2023)  multivitamin tablet Take 1 tablet by mouth once daily. (Patient not taking: Reported on 07/03/2023)  nitrofurantoin, macrocrystal-monohydrate, (MACROBID) 100 MG capsule Take 1 capsule (100 mg total) by mouth 2 (two) times daily for 5 days 10 capsule 0  pantoprazole (PROTONIX) 20 MG DR tablet 1 tablet Orally Once a day for 30 day(s) (Patient not taking: Reported on 07/03/2023)  predniSONE (DELTASONE) 10 MG tablet 6 day taper - Take as directed (Patient not taking: Reported on 06/04/2023) 21 tablet 0  promethazine-dextromethorphan (PROMETHAZINE-DM) 6.25-15 mg/5  mL syrup Take 5 mLs by mouth every 6 (six) hours as needed for Cough (Patient not taking: Reported on 06/04/2023) 120  mL 0  rosuvastatin (CRESTOR) 10 MG tablet Take by mouth Take 1 tablet (10 mg total) by mouth 2 (two) times a week. (Patient not taking: Reported on 07/03/2023)   Allergies: Tramadol Shortness Of Breath (Per the patient on 05/28/2019- she has taken Tramadol recently and did not have any SOB)  Advil [Ibuprofen] - Abdominal Pain/Gas pains  Atorvastatin - Muscle Pain, Back pain  Levothyroxine Other (Night sweats)  Pantoprazole - Rash   Review of Systems:  A comprehensive 14 point ROS was performed, reviewed by me today, and the pertinent orthopaedic findings are documented in the HPI.  Physical Exam: BP 136/70  Ht 149.9 cm (4\' 11" )  Wt 54.9 kg (121 lb)  BMI 24.44 kg/m  General/Constitutional: The patient appears to be well-nourished, well-developed, and in no acute distress. Neuro/Psych: Normal mood and affect, oriented to person, place and time. Eyes: Non-icteric. Pupils are equal, round, and reactive to light, and exhibit synchronous movement. ENT: Unremarkable. Lymphatic: No palpable adenopathy. Respiratory: Lungs clear to auscultation, Normal chest excursion, No wheezes, and Non-labored breathing Cardiovascular: Regular rate and rhythm. No murmurs. and No edema, swelling or tenderness, except as noted in detailed exam. Integumentary: No impressive skin lesions present, except as noted in detailed exam. Musculoskeletal: Unremarkable, except as noted in detailed exam.  Skin examination left knee demonstrates a mild left knee effusion at today's visit, no erythema or ecchymosis. The patient is at most mildly tender palpation along medial joint line, moderately tenderness to palpation of the lateral joint line. The patient is able to fully extend the left knee, knee flexion 110 degrees. Moderate patellofemoral crepitus with range of motion activities. Moderate valgus deformity to the left knee, the left knee is stable to varus and stress testing. Negative Lachman's test and negative  McMurray's test to the left knee. The patient is intact light touch throughout the left lower extremity. Cap refills intact to each individual toe. Dorsalis pedis and posterior tibialis pulse are intact to left lower extremity. The patient does have moderate varicose veins identified to the lateral aspect of the knee.  Imaging: AP, lateral and sunrise views of the left knee were obtained previously in the office and reviewed by me today. These x-rays demonstrate significant left knee osteoarthritic changes. The patient has complete loss of lateral compartment joint space with underlying subchondral sclerosis and osteophyte formation. There is moderate valgus alignment of the left knee. Questionable stress fracture involving the lateral femoral condyle.  Impression: Primary osteoarthritis of left knee.  Plan:  1. Treatment options were discussed today with the patient. 2. The patient is scheduled to undergo a left total knee arthroplasty with Dr. Joice Lofts on 07/08/2023. 3. During her preoperative blood work the patient did had evidence of a urinary tract infection. A prescription for Macrobid has been sent into the patient's pharmacy to take leading up to her surgery. 4. The patient was instructed on the risk and benefits of a left total knee arthroplasty at today's visit. After a detailed discussion the patient would like to proceed with a left total knee arthroplasty at this time. 5. This document will serve as a surgical history and physical for the patient. The patient can contact the clinic if she has any questions, new symptoms develop or symptoms worsen. 6. The patient will follow-up per standard postop protocol.  The procedure was discussed with the patient, as  were the potential risks (including bleeding, infection, nerve and/or blood vessel injury, persistent or recurrent pain, failure of the hardware, knee instability, stiffness, need for further surgery, blood clots, strokes, heart attacks  and/or arhythmias, pneumonia, etc.) and benefits. The patient states her understanding and wishes to proceed.    H&P reviewed and patient re-examined. No changes.

## 2023-07-08 NOTE — Anesthesia Procedure Notes (Addendum)
Spinal  Patient location during procedure: OR Start time: 07/08/2023 8:08 AM End time: 07/08/2023 8:10 AM Reason for block: surgical anesthesia Staffing Performed: anesthesiologist  Anesthesiologist: Louie Boston, MD Performed by: Louie Boston, MD Authorized by: Louie Boston, MD   Preanesthetic Checklist Completed: patient identified, IV checked, site marked, risks and benefits discussed, surgical consent, monitors and equipment checked, pre-op evaluation and timeout performed Spinal Block Patient position: sitting Prep: Betadine Patient monitoring: heart rate, continuous pulse ox, blood pressure and cardiac monitor Approach: midline Location: L4-5 Injection technique: single-shot Needle Needle type: Whitacre and Introducer  Needle gauge: 24 G Needle length: 9 cm Assessment Events: CSF return Additional Notes Negative paresthesia. Negative blood return. Positive free-flowing CSF. Expiration date of kit checked and confirmed. Patient tolerated procedure well, without complications.

## 2023-07-08 NOTE — Plan of Care (Signed)
  Problem: Activity: Goal: Ability to avoid complications of mobility impairment will improve Outcome: Progressing   Problem: Pain Management: Goal: Pain level will decrease with appropriate interventions Outcome: Progressing   

## 2023-07-09 ENCOUNTER — Other Ambulatory Visit (HOSPITAL_COMMUNITY): Payer: Self-pay

## 2023-07-09 ENCOUNTER — Encounter: Payer: Self-pay | Admitting: Surgery

## 2023-07-09 DIAGNOSIS — M170 Bilateral primary osteoarthritis of knee: Secondary | ICD-10-CM | POA: Diagnosis not present

## 2023-07-09 DIAGNOSIS — K219 Gastro-esophageal reflux disease without esophagitis: Secondary | ICD-10-CM | POA: Diagnosis not present

## 2023-07-09 DIAGNOSIS — E039 Hypothyroidism, unspecified: Secondary | ICD-10-CM | POA: Diagnosis not present

## 2023-07-09 DIAGNOSIS — M8580 Other specified disorders of bone density and structure, unspecified site: Secondary | ICD-10-CM | POA: Diagnosis not present

## 2023-07-09 MED ORDER — VITAMIN D 25 MCG (1000 UNIT) PO TABS
ORAL_TABLET | ORAL | Status: AC
  Filled 2023-07-09: qty 1

## 2023-07-09 MED ORDER — ONDANSETRON HCL 4 MG PO TABS: 4.0000 mg | ORAL_TABLET | Freq: Four times a day (QID) | ORAL | 0 refills | Status: DC | PRN

## 2023-07-09 MED ORDER — APIXABAN 2.5 MG PO TABS
ORAL_TABLET | ORAL | Status: AC
Start: 2023-07-09 — End: ?
  Filled 2023-07-09: qty 1

## 2023-07-09 MED ORDER — DOCUSATE SODIUM 100 MG PO CAPS
ORAL_CAPSULE | ORAL | Status: AC
  Filled 2023-07-09: qty 1

## 2023-07-09 NOTE — Anesthesia Postprocedure Evaluation (Signed)
Anesthesia Post Note  Patient: Kimberly Rojas  Procedure(s) Performed: TOTAL KNEE ARTHROPLASTY (Left: Knee)  Patient location during evaluation: Nursing Unit Anesthesia Type: Spinal Level of consciousness: awake and alert and oriented Pain management: pain level controlled Vital Signs Assessment: post-procedure vital signs reviewed and stable Respiratory status: spontaneous breathing and respiratory function stable Cardiovascular status: blood pressure returned to baseline Postop Assessment: no headache, no backache, no apparent nausea or vomiting, adequate PO intake and able to ambulate Anesthetic complications: no   No notable events documented.   Last Vitals:  Vitals:   07/08/23 2103 07/09/23 0707  BP: 138/71 129/60  Pulse: (!) 49 (!) 56  Resp: 14 16  Temp: 36.6 C 36.4 C  SpO2: 100% 96%    Last Pain:  Vitals:   07/09/23 0707  TempSrc: Oral  PainSc: 0-No pain                 Aylin Rhoads D Wessley Emert

## 2023-07-09 NOTE — Plan of Care (Signed)
Patient continues to progress

## 2023-07-09 NOTE — Progress Notes (Signed)
  Subjective: 1 Day Post-Op Procedure(s) (LRB): TOTAL KNEE ARTHROPLASTY (Left) Patient reports pain as mild.   Patient is well, and has had no acute complaints or problems Plan is to go Home after hospital stay. Negative for chest pain and shortness of breath Fever: no Gastrointestinal:Negative for nausea and vomiting Patient is passing gas this AM.  Objective: Vital signs in last 24 hours: Temp:  [97 F (36.1 C)-98.2 F (36.8 C)] 97.6 F (36.4 C) (11/06 0707) Pulse Rate:  [49-80] 56 (11/06 0707) Resp:  [12-18] 16 (11/06 0707) BP: (110-152)/(53-95) 129/60 (11/06 0707) SpO2:  [96 %-100 %] 96 % (11/06 0707)  Intake/Output from previous day:  Intake/Output Summary (Last 24 hours) at 07/09/2023 0717 Last data filed at 07/08/2023 1740 Gross per 24 hour  Intake 1425 ml  Output 505 ml  Net 920 ml    Intake/Output this shift: No intake/output data recorded.  Labs: No results for input(s): "HGB" in the last 72 hours. No results for input(s): "WBC", "RBC", "HCT", "PLT" in the last 72 hours. No results for input(s): "NA", "K", "CL", "CO2", "BUN", "CREATININE", "GLUCOSE", "CALCIUM" in the last 72 hours. No results for input(s): "LABPT", "INR" in the last 72 hours.   EXAM General - Patient is Alert, Appropriate, and Oriented Extremity - ABD soft Neurovascular intact Dorsiflexion/Plantar flexion intact Incision: moderate drainage No cellulitis present Compartment soft Dressing/Incision - blood tinged drainage noted to the left knee honeycomb. Motor Function - intact, moving foot and toes well on exam.  Abdomen soft with intact bowel sounds this AM.  Past Medical History:  Diagnosis Date   Anxiety    Arthritis    Chicken pox    Dyspnea    denies   Dysrhythmia    tachy at times   Fatigue    GERD (gastroesophageal reflux disease)    Hair loss    Heart murmur    hx of    Heartburn    Hyperlipidemia    Migraines    had many years ago, not currently   Peptic ulcer     Thyroid nodule     Assessment/Plan: 1 Day Post-Op Procedure(s) (LRB): TOTAL KNEE ARTHROPLASTY (Left) Principal Problem:   Status post total knee replacement using cement, left  Estimated body mass index is 24.52 kg/m as calculated from the following:   Height as of 07/01/23: 4\' 11"  (1.499 m).   Weight as of 07/01/23: 55.1 kg. Advance diet Up with therapy D/C IV fluids when tolerating po intake.  Vitals reviewed this AM. Patient is passing gas this AM. Up with therapy today. If she performs well with PT, will plan for d/c home today with HHPT.  DVT Prophylaxis - TED hose and Eliquis Weight-Bearing as tolerated to left leg  J. Horris Latino, PA-C [facility] Orthopaedic Surgery 07/09/2023, 7:17 AM

## 2023-07-09 NOTE — Progress Notes (Signed)
Physical Therapy Treatment Patient Details Name: Kimberly Rojas MRN: [id number] DOB: 1946/06/12 Today's Date: 07/09/2023   History of Present Illness Kimberly Rojas is a 77 y.o. female, patient is s/p Left TKA. At time of evaluation, patient is POD 0.    PT Comments  Pt was pleasant and motivated to participate during the session and put forth good effort throughout. Pt's son was present throughout session. Pt required no physical assist for bed mobility or transfers, CGA given for safety due to prior L knee buckling in evaluation session. Pt required no physical assist for gait/stair training (below) suggesting capable bilat LE strength/endurance. Pt vitals were monitored and remained WNL, pt reported no adverse symptoms other than constant pain in L knee/leg throughout session. Pt will benefit from continued PT services upon discharge to safely address deficits listed in patient problem list for decreased caregiver assistance and eventual return to PLOF.   If plan is discharge home, recommend the following: A little help with walking and/or transfers;Help with stairs or ramp for entrance;Assist for transportation   Can travel by private vehicle        Equipment Recommendations  Other (comment) (RW and BSC already ordered, communicated for youth RW exchange due to pt height)    Recommendations for Other Services       Precautions / Restrictions Precautions Precautions: Knee Precaution Booklet Issued: Yes (comment) Precaution Comments: Educated on proper positioning of Knee/Precautions. Restrictions Weight Bearing Restrictions: Yes LLE Weight Bearing: Weight bearing as tolerated     Mobility  Bed Mobility Overal bed mobility: Needs Assistance Bed Mobility: Supine to Sit     Supine to sit: Supervision, HOB elevated, Used rails     General bed mobility comments: no physical difficulty, increased time    Transfers Overall transfer level: Needs assistance Equipment used:  Rolling walker (2 wheels) Transfers: Sit to/from Stand Sit to Stand: Contact guard assist           General transfer comment: mild unsteadiness but pt showed improvement with subsequent attempt, multimodal cuing for hand placement and upright posture    Ambulation/Gait Ambulation/Gait assistance: Contact guard assist Gait Distance (Feet): 75 Feet x2 Assistive device: Rolling walker (2 wheels) Gait Pattern/deviations: Step-through pattern, Decreased stance time - left, Decreased step length - right, Decreased step length - left, Narrow base of support Gait velocity: decreased     General Gait Details: Good AD management and no L knee buckling observed/reported through multiple bouts, VCs for body placement within RW and to widen BOS for improved stability   Stairs Stairs: Yes Stairs assistance: Contact guard assist Stair Management: One rail Left, Step to pattern Number of Stairs: 4 General stair comments: Pt exhibited good immediate recall of stair ascend/descend education, able to ascend/descend at good pace and safely with bilat hands on rail or RW at base of stairs   Wheelchair Mobility     Tilt Bed    Modified Rankin (Stroke Patients Only)       Balance Overall balance assessment: Needs assistance Sitting-balance support: Feet supported, Single extremity supported Sitting balance-Leahy Scale: Good Sitting balance - Comments: able to weight shift and scoot with no physical assist at EOB to self-adjust   Standing balance support: Bilateral upper extremity supported, During functional activity Standing balance-Leahy Scale: Good Standing balance comment: able to static stand with CGA for brief period of time with no LOB  Cognition Arousal: Alert Behavior During Therapy: WFL for tasks assessed/performed Overall Cognitive Status: Within Functional Limits for tasks assessed                                           Exercises Total Joint Exercises Quad Sets: AROM, Left, 15 reps, Supine Knee Flexion: AROM, Left, 15 reps, Seated Goniometric ROM: L Knee AROM 5-91 deg Other Exercises: Pt education provided on L knee positioning to encourage extension PROM Other Exercises: Educated on car transfers using RW for safety and efficiency, pt and son present, denied any questions at this time    General Comments        Pertinent Vitals/Pain Pain Assessment Pain Assessment: 0-10 Pain Score: 8  Pain Location: L knee, with gait Pain Descriptors / Indicators: Aching, Sore, Tightness Pain Intervention(s): Monitored during session, Ice applied, Patient requesting pain meds-RN notified, Repositioned    Home Living                          Prior Function            PT Goals (current goals can now be found in the care plan section) Acute Rehab PT Goals Patient Stated Goal: Get Home and Be Independent PT Goal Formulation: With patient Time For Goal Achievement: 07/22/23 Potential to Achieve Goals: Good Progress towards PT goals: Progressing toward goals    Frequency    BID      PT Plan      Co-evaluation              AM-PAC PT "6 Clicks" Mobility   Outcome Measure  Help needed turning from your back to your side while in a flat bed without using bedrails?: None Help needed moving from lying on your back to sitting on the side of a flat bed without using bedrails?: None Help needed moving to and from a bed to a chair (including a wheelchair)?: A Little Help needed standing up from a chair using your arms (e.g., wheelchair or bedside chair)?: None Help needed to walk in hospital room?: A Little Help needed climbing 3-5 steps with a railing? : A Little 6 Click Score: 21    End of Session Equipment Utilized During Treatment: Gait belt Activity Tolerance: No increased pain;Patient tolerated treatment well Patient left: in chair;with call bell/phone within reach;with  family/visitor present;Other (comment) (ice reapplied) Nurse Communication: Mobility status;Patient requests pain meds PT Visit Diagnosis: Unsteadiness on feet (R26.81);Other abnormalities of gait and mobility (R26.89);Difficulty in walking, not elsewhere classified (R26.2);Pain Pain - Right/Left: Left Pain - part of body: Knee     Time: 4098-1191 PT Time Calculation (min) (ACUTE ONLY): 42 min  Charges:                           Rosiland Oz SPT 07/09/23, 1:27 PM

## 2023-07-09 NOTE — Plan of Care (Signed)
  Problem: Activity: Goal: Risk for activity intolerance will decrease Outcome: Progressing   Problem: Nutrition: Goal: Adequate nutrition will be maintained Outcome: Progressing   Problem: Pain Management: Goal: General experience of comfort will improve Outcome: Progressing

## 2023-07-09 NOTE — Progress Notes (Signed)
DISCHARGE NOTE:  Pt given discharge instructions and scripts. Pt verbalized understanding. BSC and walker sent with pt. TED hose on. Pt wheeled to car by staff. Son providing transportation home.

## 2023-07-09 NOTE — TOC Benefit Eligibility Note (Signed)

## 2023-07-09 NOTE — Discharge Summary (Signed)
Physician Discharge Summary  Patient ID: Kimberly Rojas MRN: [id number] DOB/AGE: [birth year] 77 y.o.  Admit date: 07/08/2023 Discharge date: 07/09/2023  Admission Diagnoses:  Status post total knee replacement using cement, left [Z96.652] Degenerative joint disease of the left knee.  Discharge Diagnoses: Patient Active Problem List   Diagnosis Date Noted   Status post total knee replacement using cement, left 07/08/2023   Venous stasis dermatitis of left lower extremity 02/18/2023   Gastroesophageal reflux disease 12/02/2022   S/P reverse total shoulder arthroplasty, left 05/04/2022   Unintentional weight loss 05/04/2022   Thoracic aortic atherosclerosis (HCC) 07/31/2021   Interstitial lung disease (HCC) 06/26/2021   B12 deficiency 10/10/2020   Varicose veins of both lower extremities without ulcer or inflammation 02/04/2020   Complex tear of lateral meniscus of left knee as current injury 02/01/2019   Primary osteoarthritis of left knee 02/01/2019   SVT (supraventricular tachycardia) (HCC) 01/30/2019   Hiatal hernia 11/29/2017   GERD with esophagitis 09/20/2017   Generalized anxiety disorder 09/20/2017   White coat syndrome without diagnosis of hypertension 05/21/2017   Osteopenia 01/09/2017   Encounter for preventive health examination 11/05/2015   Subclinical hypothyroidism 11/23/2014   Pure hypercholesterolemia 11/23/2014    Past Medical History:  Diagnosis Date   Anxiety    Arthritis    Chicken pox    Dyspnea    denies   Dysrhythmia    tachy at times   Fatigue    GERD (gastroesophageal reflux disease)    Hair loss    Heart murmur    hx of    Heartburn    Hyperlipidemia    Migraines    had many years ago, not currently   Peptic ulcer    Thyroid nodule      Transfusion: None.   Consultants (if any):   Discharged Condition: Improved  Hospital Course: Kimberly Rojas is an 77 y.o. female who was admitted 07/08/2023 with a diagnosis of left knee  degenerative joint disease and went to the operating room on 07/08/2023 and underwent the above named procedures.    Surgeries: Procedure(s): TOTAL KNEE ARTHROPLASTY on 07/08/2023 Patient tolerated the surgery well. Taken to PACU where she was stabilized and then transferred to the post operative recovery area.  Started on Eliquis 2.5mg  every 12 hrs. Foot pumps applied bilaterally at 80 mm. Heels elevated on bed with rolled towels. No evidence of DVT. Negative Homan. Physical therapy started on day #1 for gait training and transfer. OT started day #1 for ADL and assisted devices.  Patient's IV was removed on POD1.  Implants: Left TKA using all-cemented Zimmer Persona system with a #5 PCR femur and a D-sized  tibial tray with an  11 mm medial congruent E-poly insert.   She was given perioperative antibiotics:  Anti-infectives (From admission, onward)    Start     Dose/Rate Route Frequency Ordered Stop   07/08/23 1100  ceFAZolin (ANCEF) IVPB 2g/100 mL premix        2 g 200 mL/hr over 30 Minutes Intravenous Every 8 hours 07/08/23 1042 07/09/23 0559   07/08/23 0600  ceFAZolin (ANCEF) IVPB 2g/100 mL premix        2 g 200 mL/hr over 30 Minutes Intravenous On call to O.R. 07/07/23 2216 07/08/23 5784     .  She was given sequential compression devices, early ambulation, and Eliquis for DVT prophylaxis.  She benefited maximally from the hospital stay and there were no complications.    Recent vital signs:  Vitals:   07/08/23 2103 07/09/23 0707  BP: 138/71 129/60  Pulse: (!) 49 (!) 56  Resp: 14 16  Temp: 97.8 F (36.6 C) 97.6 F (36.4 C)  SpO2: 100% 96%    Recent laboratory studies:  Lab Results  Component Value Date   HGB 12.5 07/01/2023   HGB 12.3 02/18/2023   HGB 11.8 (L) 04/23/2022   Lab Results  Component Value Date   WBC 7.0 07/01/2023   PLT 206 07/01/2023   Lab Results  Component Value Date   INR 1.03 03/26/2013   Lab Results  Component Value Date   NA 135  07/01/2023   K 3.6 07/01/2023   CL 101 07/01/2023   CO2 26 07/01/2023   BUN 21 07/01/2023   CREATININE 0.78 07/01/2023   GLUCOSE 106 (H) 07/01/2023    Discharge Medications:   Allergies as of 07/09/2023       Reactions   Tramadol Shortness Of Breath   Lipitor [atorvastatin] Other (See Comments)   Back pain    Advil [ibuprofen] Other (See Comments)   Gas pains   Protonix [pantoprazole] Rash   Potential        Medication List     STOP taking these medications    calcium carbonate 500 MG chewable tablet Commonly known as: TUMS - dosed in mg elemental calcium   rosuvastatin 10 MG tablet Commonly known as: CRESTOR       TAKE these medications    acetaminophen 500 MG tablet Commonly known as: TYLENOL Take 500 mg by mouth every 6 (six) hours as needed (pain.).   apixaban 2.5 MG Tabs tablet Commonly known as: Eliquis Take 1 tablet (2.5 mg total) by mouth 2 (two) times daily.   busPIRone 7.5 MG tablet Commonly known as: BUSPAR Take 1 tablet (7.5 mg total) by mouth 3 (three) times daily as needed (anxiety/nerves.). TAKE 1 TABLET(7.5 MG) BY MOUTH THREE TIMES DAILY   carboxymethylcellulose 0.5 % Soln Commonly known as: REFRESH PLUS 1-2 drops 3 (three) times daily as needed (dry/irritated eyes.).   cholecalciferol 25 MCG (1000 UNIT) tablet Commonly known as: VITAMIN D3 Take 1,000 Units by mouth in the morning.   CHOLESTEROL MANAGEMENT PO Take 2 capsules by mouth in the morning. CholestaCare Supplement   cyanocobalamin 1000 MCG tablet Commonly known as: VITAMIN B12 Take 1,000 mcg by mouth in the morning.   docusate sodium 100 MG capsule Commonly known as: COLACE Take 100 mg by mouth in the morning.   HYDROcodone-acetaminophen 5-325 MG tablet Commonly known as: NORCO/VICODIN Take 1-2 tablets by mouth every 6 (six) hours as needed for moderate pain (pain score 4-6) or severe pain (pain score 7-10).   levothyroxine 25 MCG tablet Commonly known as:  SYNTHROID Take 25 mcg by mouth See admin instructions. Take 2 tablets (50 mcg) by mouth on Saturdays & Sundays in the morning Take 1 tablet (25 mcg) by mouth on Mondays, through Fridays in the morning.   ondansetron 4 MG tablet Commonly known as: ZOFRAN Take 1 tablet (4 mg total) by mouth every 6 (six) hours as needed for nausea.   triamcinolone cream 0.1 % Commonly known as: KENALOG Apply 1 Application topically 2 (two) times daily as needed (phlebitis (itching) of ankle).       Diagnostic Studies: DG Knee Left Port  Result Date: 07/08/2023 CLINICAL DATA:  Status post knee replacement, left. EXAM: PORTABLE LEFT KNEE - 1-2 VIEW COMPARISON:  None Available. FINDINGS: Left knee arthroplasty in expected alignment. No periprosthetic lucency  or fracture. There has been patellar resurfacing. Recent postsurgical change includes air and edema in the soft tissues and joint space. Anterior skin staples in place. IMPRESSION: Left knee arthroplasty without immediate postoperative complication. Electronically Signed   By: Narda Rutherford M.D.   On: 07/08/2023 12:33    Disposition: Plan for discharge home today pending progress with PT.   Follow-up Information     Anson [place], PA-C. Go on 07/23/2023.   Specialty: Physician Assistant Why: at 9:45am for post operative follow up appointment Contact information: [address] [place] [address] [phone]                Signed: Meriel Pica PA-C 07/09/2023, 7:24 AM

## 2023-07-10 DIAGNOSIS — Z471 Aftercare following joint replacement surgery: Secondary | ICD-10-CM | POA: Diagnosis not present

## 2023-07-10 DIAGNOSIS — E7800 Pure hypercholesterolemia, unspecified: Secondary | ICD-10-CM | POA: Diagnosis not present

## 2023-07-10 DIAGNOSIS — Z7901 Long term (current) use of anticoagulants: Secondary | ICD-10-CM | POA: Diagnosis not present

## 2023-07-10 DIAGNOSIS — M8580 Other specified disorders of bone density and structure, unspecified site: Secondary | ICD-10-CM | POA: Diagnosis not present

## 2023-07-10 DIAGNOSIS — I700 Atherosclerosis of aorta: Secondary | ICD-10-CM | POA: Diagnosis not present

## 2023-07-10 DIAGNOSIS — Z96652 Presence of left artificial knee joint: Secondary | ICD-10-CM | POA: Diagnosis not present

## 2023-07-10 DIAGNOSIS — I471 Supraventricular tachycardia, unspecified: Secondary | ICD-10-CM | POA: Diagnosis not present

## 2023-07-10 DIAGNOSIS — J849 Interstitial pulmonary disease, unspecified: Secondary | ICD-10-CM | POA: Diagnosis not present

## 2023-07-10 DIAGNOSIS — I8392 Asymptomatic varicose veins of left lower extremity: Secondary | ICD-10-CM | POA: Diagnosis not present

## 2023-07-10 DIAGNOSIS — K219 Gastro-esophageal reflux disease without esophagitis: Secondary | ICD-10-CM | POA: Diagnosis not present

## 2023-07-10 DIAGNOSIS — E538 Deficiency of other specified B group vitamins: Secondary | ICD-10-CM | POA: Diagnosis not present

## 2023-07-10 DIAGNOSIS — Z96612 Presence of left artificial shoulder joint: Secondary | ICD-10-CM | POA: Diagnosis not present

## 2023-07-10 DIAGNOSIS — Z8711 Personal history of peptic ulcer disease: Secondary | ICD-10-CM | POA: Diagnosis not present

## 2023-07-10 DIAGNOSIS — K449 Diaphragmatic hernia without obstruction or gangrene: Secondary | ICD-10-CM | POA: Diagnosis not present

## 2023-07-10 DIAGNOSIS — G43909 Migraine, unspecified, not intractable, without status migrainosus: Secondary | ICD-10-CM | POA: Diagnosis not present

## 2023-07-10 DIAGNOSIS — E02 Subclinical iodine-deficiency hypothyroidism: Secondary | ICD-10-CM | POA: Diagnosis not present

## 2023-07-12 DIAGNOSIS — I471 Supraventricular tachycardia, unspecified: Secondary | ICD-10-CM | POA: Diagnosis not present

## 2023-07-12 DIAGNOSIS — Z7901 Long term (current) use of anticoagulants: Secondary | ICD-10-CM | POA: Diagnosis not present

## 2023-07-12 DIAGNOSIS — I8392 Asymptomatic varicose veins of left lower extremity: Secondary | ICD-10-CM | POA: Diagnosis not present

## 2023-07-12 DIAGNOSIS — K219 Gastro-esophageal reflux disease without esophagitis: Secondary | ICD-10-CM | POA: Diagnosis not present

## 2023-07-12 DIAGNOSIS — E538 Deficiency of other specified B group vitamins: Secondary | ICD-10-CM | POA: Diagnosis not present

## 2023-07-12 DIAGNOSIS — Z8711 Personal history of peptic ulcer disease: Secondary | ICD-10-CM | POA: Diagnosis not present

## 2023-07-12 DIAGNOSIS — M8580 Other specified disorders of bone density and structure, unspecified site: Secondary | ICD-10-CM | POA: Diagnosis not present

## 2023-07-12 DIAGNOSIS — I700 Atherosclerosis of aorta: Secondary | ICD-10-CM | POA: Diagnosis not present

## 2023-07-12 DIAGNOSIS — E02 Subclinical iodine-deficiency hypothyroidism: Secondary | ICD-10-CM | POA: Diagnosis not present

## 2023-07-12 DIAGNOSIS — G43909 Migraine, unspecified, not intractable, without status migrainosus: Secondary | ICD-10-CM | POA: Diagnosis not present

## 2023-07-12 DIAGNOSIS — Z96612 Presence of left artificial shoulder joint: Secondary | ICD-10-CM | POA: Diagnosis not present

## 2023-07-12 DIAGNOSIS — J849 Interstitial pulmonary disease, unspecified: Secondary | ICD-10-CM | POA: Diagnosis not present

## 2023-07-12 DIAGNOSIS — Z96652 Presence of left artificial knee joint: Secondary | ICD-10-CM | POA: Diagnosis not present

## 2023-07-12 DIAGNOSIS — Z471 Aftercare following joint replacement surgery: Secondary | ICD-10-CM | POA: Diagnosis not present

## 2023-07-12 DIAGNOSIS — E7800 Pure hypercholesterolemia, unspecified: Secondary | ICD-10-CM | POA: Diagnosis not present

## 2023-07-12 DIAGNOSIS — K449 Diaphragmatic hernia without obstruction or gangrene: Secondary | ICD-10-CM | POA: Diagnosis not present

## 2023-07-14 DIAGNOSIS — Z8711 Personal history of peptic ulcer disease: Secondary | ICD-10-CM | POA: Diagnosis not present

## 2023-07-14 DIAGNOSIS — M8580 Other specified disorders of bone density and structure, unspecified site: Secondary | ICD-10-CM | POA: Diagnosis not present

## 2023-07-14 DIAGNOSIS — J849 Interstitial pulmonary disease, unspecified: Secondary | ICD-10-CM | POA: Diagnosis not present

## 2023-07-14 DIAGNOSIS — E7800 Pure hypercholesterolemia, unspecified: Secondary | ICD-10-CM | POA: Diagnosis not present

## 2023-07-14 DIAGNOSIS — G43909 Migraine, unspecified, not intractable, without status migrainosus: Secondary | ICD-10-CM | POA: Diagnosis not present

## 2023-07-14 DIAGNOSIS — I700 Atherosclerosis of aorta: Secondary | ICD-10-CM | POA: Diagnosis not present

## 2023-07-14 DIAGNOSIS — Z96612 Presence of left artificial shoulder joint: Secondary | ICD-10-CM | POA: Diagnosis not present

## 2023-07-14 DIAGNOSIS — K219 Gastro-esophageal reflux disease without esophagitis: Secondary | ICD-10-CM | POA: Diagnosis not present

## 2023-07-14 DIAGNOSIS — Z96652 Presence of left artificial knee joint: Secondary | ICD-10-CM | POA: Diagnosis not present

## 2023-07-14 DIAGNOSIS — E02 Subclinical iodine-deficiency hypothyroidism: Secondary | ICD-10-CM | POA: Diagnosis not present

## 2023-07-14 DIAGNOSIS — I471 Supraventricular tachycardia, unspecified: Secondary | ICD-10-CM | POA: Diagnosis not present

## 2023-07-14 DIAGNOSIS — Z471 Aftercare following joint replacement surgery: Secondary | ICD-10-CM | POA: Diagnosis not present

## 2023-07-14 DIAGNOSIS — E538 Deficiency of other specified B group vitamins: Secondary | ICD-10-CM | POA: Diagnosis not present

## 2023-07-14 DIAGNOSIS — K449 Diaphragmatic hernia without obstruction or gangrene: Secondary | ICD-10-CM | POA: Diagnosis not present

## 2023-07-14 DIAGNOSIS — I8392 Asymptomatic varicose veins of left lower extremity: Secondary | ICD-10-CM | POA: Diagnosis not present

## 2023-07-14 DIAGNOSIS — Z7901 Long term (current) use of anticoagulants: Secondary | ICD-10-CM | POA: Diagnosis not present

## 2023-07-16 DIAGNOSIS — Z96652 Presence of left artificial knee joint: Secondary | ICD-10-CM | POA: Diagnosis not present

## 2023-07-16 DIAGNOSIS — I700 Atherosclerosis of aorta: Secondary | ICD-10-CM | POA: Diagnosis not present

## 2023-07-16 DIAGNOSIS — E538 Deficiency of other specified B group vitamins: Secondary | ICD-10-CM | POA: Diagnosis not present

## 2023-07-16 DIAGNOSIS — G43909 Migraine, unspecified, not intractable, without status migrainosus: Secondary | ICD-10-CM | POA: Diagnosis not present

## 2023-07-16 DIAGNOSIS — K219 Gastro-esophageal reflux disease without esophagitis: Secondary | ICD-10-CM | POA: Diagnosis not present

## 2023-07-16 DIAGNOSIS — Z471 Aftercare following joint replacement surgery: Secondary | ICD-10-CM | POA: Diagnosis not present

## 2023-07-16 DIAGNOSIS — I471 Supraventricular tachycardia, unspecified: Secondary | ICD-10-CM | POA: Diagnosis not present

## 2023-07-16 DIAGNOSIS — Z96612 Presence of left artificial shoulder joint: Secondary | ICD-10-CM | POA: Diagnosis not present

## 2023-07-16 DIAGNOSIS — I8392 Asymptomatic varicose veins of left lower extremity: Secondary | ICD-10-CM | POA: Diagnosis not present

## 2023-07-16 DIAGNOSIS — E02 Subclinical iodine-deficiency hypothyroidism: Secondary | ICD-10-CM | POA: Diagnosis not present

## 2023-07-16 DIAGNOSIS — J849 Interstitial pulmonary disease, unspecified: Secondary | ICD-10-CM | POA: Diagnosis not present

## 2023-07-16 DIAGNOSIS — Z8711 Personal history of peptic ulcer disease: Secondary | ICD-10-CM | POA: Diagnosis not present

## 2023-07-16 DIAGNOSIS — M8580 Other specified disorders of bone density and structure, unspecified site: Secondary | ICD-10-CM | POA: Diagnosis not present

## 2023-07-16 DIAGNOSIS — E7800 Pure hypercholesterolemia, unspecified: Secondary | ICD-10-CM | POA: Diagnosis not present

## 2023-07-16 DIAGNOSIS — K449 Diaphragmatic hernia without obstruction or gangrene: Secondary | ICD-10-CM | POA: Diagnosis not present

## 2023-07-16 DIAGNOSIS — Z7901 Long term (current) use of anticoagulants: Secondary | ICD-10-CM | POA: Diagnosis not present

## 2023-07-18 ENCOUNTER — Encounter: Payer: Self-pay | Admitting: Surgery

## 2023-07-18 DIAGNOSIS — E7800 Pure hypercholesterolemia, unspecified: Secondary | ICD-10-CM | POA: Diagnosis not present

## 2023-07-18 DIAGNOSIS — Z7901 Long term (current) use of anticoagulants: Secondary | ICD-10-CM | POA: Diagnosis not present

## 2023-07-18 DIAGNOSIS — M8580 Other specified disorders of bone density and structure, unspecified site: Secondary | ICD-10-CM | POA: Diagnosis not present

## 2023-07-18 DIAGNOSIS — K219 Gastro-esophageal reflux disease without esophagitis: Secondary | ICD-10-CM | POA: Diagnosis not present

## 2023-07-18 DIAGNOSIS — Z8711 Personal history of peptic ulcer disease: Secondary | ICD-10-CM | POA: Diagnosis not present

## 2023-07-18 DIAGNOSIS — E538 Deficiency of other specified B group vitamins: Secondary | ICD-10-CM | POA: Diagnosis not present

## 2023-07-18 DIAGNOSIS — E02 Subclinical iodine-deficiency hypothyroidism: Secondary | ICD-10-CM | POA: Diagnosis not present

## 2023-07-18 DIAGNOSIS — K449 Diaphragmatic hernia without obstruction or gangrene: Secondary | ICD-10-CM | POA: Diagnosis not present

## 2023-07-18 DIAGNOSIS — G43909 Migraine, unspecified, not intractable, without status migrainosus: Secondary | ICD-10-CM | POA: Diagnosis not present

## 2023-07-18 DIAGNOSIS — J849 Interstitial pulmonary disease, unspecified: Secondary | ICD-10-CM | POA: Diagnosis not present

## 2023-07-18 DIAGNOSIS — I8392 Asymptomatic varicose veins of left lower extremity: Secondary | ICD-10-CM | POA: Diagnosis not present

## 2023-07-18 DIAGNOSIS — Z96652 Presence of left artificial knee joint: Secondary | ICD-10-CM | POA: Diagnosis not present

## 2023-07-18 DIAGNOSIS — Z96612 Presence of left artificial shoulder joint: Secondary | ICD-10-CM | POA: Diagnosis not present

## 2023-07-18 DIAGNOSIS — I471 Supraventricular tachycardia, unspecified: Secondary | ICD-10-CM | POA: Diagnosis not present

## 2023-07-18 DIAGNOSIS — Z471 Aftercare following joint replacement surgery: Secondary | ICD-10-CM | POA: Diagnosis not present

## 2023-07-18 DIAGNOSIS — I700 Atherosclerosis of aorta: Secondary | ICD-10-CM | POA: Diagnosis not present

## 2023-07-22 DIAGNOSIS — Z8711 Personal history of peptic ulcer disease: Secondary | ICD-10-CM | POA: Diagnosis not present

## 2023-07-22 DIAGNOSIS — E538 Deficiency of other specified B group vitamins: Secondary | ICD-10-CM | POA: Diagnosis not present

## 2023-07-22 DIAGNOSIS — K219 Gastro-esophageal reflux disease without esophagitis: Secondary | ICD-10-CM | POA: Diagnosis not present

## 2023-07-22 DIAGNOSIS — Z471 Aftercare following joint replacement surgery: Secondary | ICD-10-CM | POA: Diagnosis not present

## 2023-07-22 DIAGNOSIS — E7800 Pure hypercholesterolemia, unspecified: Secondary | ICD-10-CM | POA: Diagnosis not present

## 2023-07-22 DIAGNOSIS — Z96612 Presence of left artificial shoulder joint: Secondary | ICD-10-CM | POA: Diagnosis not present

## 2023-07-22 DIAGNOSIS — Z7901 Long term (current) use of anticoagulants: Secondary | ICD-10-CM | POA: Diagnosis not present

## 2023-07-22 DIAGNOSIS — I700 Atherosclerosis of aorta: Secondary | ICD-10-CM | POA: Diagnosis not present

## 2023-07-22 DIAGNOSIS — Z96652 Presence of left artificial knee joint: Secondary | ICD-10-CM | POA: Diagnosis not present

## 2023-07-22 DIAGNOSIS — M8580 Other specified disorders of bone density and structure, unspecified site: Secondary | ICD-10-CM | POA: Diagnosis not present

## 2023-07-22 DIAGNOSIS — E02 Subclinical iodine-deficiency hypothyroidism: Secondary | ICD-10-CM | POA: Diagnosis not present

## 2023-07-22 DIAGNOSIS — J849 Interstitial pulmonary disease, unspecified: Secondary | ICD-10-CM | POA: Diagnosis not present

## 2023-07-22 DIAGNOSIS — G43909 Migraine, unspecified, not intractable, without status migrainosus: Secondary | ICD-10-CM | POA: Diagnosis not present

## 2023-07-22 DIAGNOSIS — I471 Supraventricular tachycardia, unspecified: Secondary | ICD-10-CM | POA: Diagnosis not present

## 2023-07-22 DIAGNOSIS — K449 Diaphragmatic hernia without obstruction or gangrene: Secondary | ICD-10-CM | POA: Diagnosis not present

## 2023-07-22 DIAGNOSIS — I8392 Asymptomatic varicose veins of left lower extremity: Secondary | ICD-10-CM | POA: Diagnosis not present

## 2023-07-23 DIAGNOSIS — M25512 Pain in left shoulder: Secondary | ICD-10-CM | POA: Diagnosis not present

## 2023-07-23 DIAGNOSIS — M25612 Stiffness of left shoulder, not elsewhere classified: Secondary | ICD-10-CM | POA: Diagnosis not present

## 2023-07-23 DIAGNOSIS — Z96612 Presence of left artificial shoulder joint: Secondary | ICD-10-CM | POA: Diagnosis not present

## 2023-07-23 DIAGNOSIS — R29898 Other symptoms and signs involving the musculoskeletal system: Secondary | ICD-10-CM | POA: Diagnosis not present

## 2023-07-28 DIAGNOSIS — Z96652 Presence of left artificial knee joint: Secondary | ICD-10-CM | POA: Diagnosis not present

## 2023-07-30 DIAGNOSIS — M25662 Stiffness of left knee, not elsewhere classified: Secondary | ICD-10-CM | POA: Diagnosis not present

## 2023-07-30 DIAGNOSIS — Z96652 Presence of left artificial knee joint: Secondary | ICD-10-CM | POA: Diagnosis not present

## 2023-08-05 DIAGNOSIS — G8929 Other chronic pain: Secondary | ICD-10-CM | POA: Diagnosis not present

## 2023-08-05 DIAGNOSIS — Z96652 Presence of left artificial knee joint: Secondary | ICD-10-CM | POA: Diagnosis not present

## 2023-08-05 DIAGNOSIS — M6281 Muscle weakness (generalized): Secondary | ICD-10-CM | POA: Diagnosis not present

## 2023-08-05 DIAGNOSIS — M25662 Stiffness of left knee, not elsewhere classified: Secondary | ICD-10-CM | POA: Diagnosis not present

## 2023-08-05 DIAGNOSIS — M25562 Pain in left knee: Secondary | ICD-10-CM | POA: Diagnosis not present

## 2023-08-07 DIAGNOSIS — M25662 Stiffness of left knee, not elsewhere classified: Secondary | ICD-10-CM | POA: Diagnosis not present

## 2023-08-07 DIAGNOSIS — Z96652 Presence of left artificial knee joint: Secondary | ICD-10-CM | POA: Diagnosis not present

## 2023-08-12 DIAGNOSIS — M25662 Stiffness of left knee, not elsewhere classified: Secondary | ICD-10-CM | POA: Diagnosis not present

## 2023-08-12 DIAGNOSIS — Z96652 Presence of left artificial knee joint: Secondary | ICD-10-CM | POA: Diagnosis not present

## 2023-08-14 DIAGNOSIS — M25562 Pain in left knee: Secondary | ICD-10-CM | POA: Diagnosis not present

## 2023-08-14 DIAGNOSIS — Z96652 Presence of left artificial knee joint: Secondary | ICD-10-CM | POA: Diagnosis not present

## 2023-08-14 DIAGNOSIS — M25662 Stiffness of left knee, not elsewhere classified: Secondary | ICD-10-CM | POA: Diagnosis not present

## 2023-08-14 DIAGNOSIS — M6281 Muscle weakness (generalized): Secondary | ICD-10-CM | POA: Diagnosis not present

## 2023-08-14 DIAGNOSIS — G8929 Other chronic pain: Secondary | ICD-10-CM | POA: Diagnosis not present

## 2023-08-19 DIAGNOSIS — Z96652 Presence of left artificial knee joint: Secondary | ICD-10-CM | POA: Diagnosis not present

## 2023-08-22 ENCOUNTER — Other Ambulatory Visit: Payer: Self-pay | Admitting: Internal Medicine

## 2023-08-22 DIAGNOSIS — M1712 Unilateral primary osteoarthritis, left knee: Secondary | ICD-10-CM | POA: Diagnosis not present

## 2023-08-22 DIAGNOSIS — Z1231 Encounter for screening mammogram for malignant neoplasm of breast: Secondary | ICD-10-CM

## 2023-08-25 ENCOUNTER — Ambulatory Visit
Admission: RE | Admit: 2023-08-25 | Discharge: 2023-08-25 | Disposition: A | Discharge status: Home / Self Care | Payer: Medicare Other | Source: Ambulatory Visit | Attending: Internal Medicine | Admitting: Internal Medicine

## 2023-08-25 DIAGNOSIS — Z1231 Encounter for screening mammogram for malignant neoplasm of breast: Secondary | ICD-10-CM | POA: Diagnosis not present

## 2023-10-06 DIAGNOSIS — H6123 Impacted cerumen, bilateral: Secondary | ICD-10-CM | POA: Diagnosis not present

## 2023-10-06 DIAGNOSIS — H903 Sensorineural hearing loss, bilateral: Secondary | ICD-10-CM | POA: Diagnosis not present

## 2023-11-10 DIAGNOSIS — M1712 Unilateral primary osteoarthritis, left knee: Secondary | ICD-10-CM | POA: Diagnosis not present

## 2023-11-10 DIAGNOSIS — Z96652 Presence of left artificial knee joint: Secondary | ICD-10-CM | POA: Diagnosis not present

## 2023-11-17 DIAGNOSIS — M5416 Radiculopathy, lumbar region: Secondary | ICD-10-CM | POA: Diagnosis not present

## 2023-11-17 DIAGNOSIS — M48062 Spinal stenosis, lumbar region with neurogenic claudication: Secondary | ICD-10-CM | POA: Diagnosis not present

## 2023-11-17 DIAGNOSIS — M7918 Myalgia, other site: Secondary | ICD-10-CM | POA: Diagnosis not present

## 2023-12-10 ENCOUNTER — Encounter: Payer: Self-pay | Admitting: Internal Medicine

## 2023-12-10 ENCOUNTER — Ambulatory Visit: Admitting: Internal Medicine

## 2023-12-10 VITALS — BP 120/74 | HR 64 | Ht 59.0 in | Wt 114.2 lb

## 2023-12-10 DIAGNOSIS — F411 Generalized anxiety disorder: Secondary | ICD-10-CM

## 2023-12-10 DIAGNOSIS — D649 Anemia, unspecified: Secondary | ICD-10-CM | POA: Diagnosis not present

## 2023-12-10 DIAGNOSIS — R030 Elevated blood-pressure reading, without diagnosis of hypertension: Secondary | ICD-10-CM

## 2023-12-10 DIAGNOSIS — Z96652 Presence of left artificial knee joint: Secondary | ICD-10-CM

## 2023-12-10 DIAGNOSIS — E7800 Pure hypercholesterolemia, unspecified: Secondary | ICD-10-CM

## 2023-12-10 DIAGNOSIS — Z0001 Encounter for general adult medical examination with abnormal findings: Secondary | ICD-10-CM | POA: Diagnosis not present

## 2023-12-10 DIAGNOSIS — Z23 Encounter for immunization: Secondary | ICD-10-CM

## 2023-12-10 DIAGNOSIS — G3184 Mild cognitive impairment, so stated: Secondary | ICD-10-CM

## 2023-12-10 DIAGNOSIS — E038 Other specified hypothyroidism: Secondary | ICD-10-CM | POA: Diagnosis not present

## 2023-12-10 DIAGNOSIS — G8929 Other chronic pain: Secondary | ICD-10-CM

## 2023-12-10 DIAGNOSIS — M5440 Lumbago with sciatica, unspecified side: Secondary | ICD-10-CM

## 2023-12-10 DIAGNOSIS — E559 Vitamin D deficiency, unspecified: Secondary | ICD-10-CM

## 2023-12-10 DIAGNOSIS — E538 Deficiency of other specified B group vitamins: Secondary | ICD-10-CM | POA: Diagnosis not present

## 2023-12-10 DIAGNOSIS — Z1211 Encounter for screening for malignant neoplasm of colon: Secondary | ICD-10-CM

## 2023-12-10 LAB — COMPREHENSIVE METABOLIC PANEL WITH GFR
ALT: 12 U/L (ref 0–35)
AST: 18 U/L (ref 0–37)
Albumin: 4.2 g/dL (ref 3.5–5.2)
Alkaline Phosphatase: 95 U/L (ref 39–117)
BUN: 18 mg/dL (ref 6–23)
CO2: 29 meq/L (ref 19–32)
Calcium: 9.2 mg/dL (ref 8.4–10.5)
Chloride: 100 meq/L (ref 96–112)
Creatinine, Ser: 0.83 mg/dL (ref 0.40–1.20)
GFR: 66.9 mL/min (ref >60.00)
Glucose, Bld: 99 mg/dL (ref 70–99)
Potassium: 3.9 meq/L (ref 3.5–5.1)
Sodium: 135 meq/L (ref 135–145)
Total Bilirubin: 0.4 mg/dL (ref 0.2–1.2)
Total Protein: 6.7 g/dL (ref 6.0–8.3)

## 2023-12-10 LAB — CBC WITH DIFFERENTIAL/PLATELET
Basophils Absolute: 0 10*3/uL (ref 0.0–0.1)
Basophils Relative: 0.6 % (ref 0.0–3.0)
Eosinophils Absolute: 0.2 10*3/uL (ref 0.0–0.7)
Eosinophils Relative: 2.2 % (ref 0.0–5.0)
HCT: 40.2 % (ref 36.0–46.0)
Hemoglobin: 13.5 g/dL (ref 12.0–15.0)
Lymphocytes Relative: 15.4 % (ref 12.0–46.0)
Lymphs Abs: 1.2 10*3/uL (ref 0.7–4.0)
MCHC: 33.6 g/dL (ref 30.0–36.0)
MCV: 88.5 fl (ref 78.0–100.0)
Monocytes Absolute: 0.7 10*3/uL (ref 0.1–1.0)
Monocytes Relative: 8.2 % (ref 3.0–12.0)
Neutro Abs: 5.9 10*3/uL (ref 1.4–7.7)
Neutrophils Relative %: 73.6 % (ref 43.0–77.0)
Platelets: 227 10*3/uL (ref 150.0–400.0)
RBC: 4.54 Mil/uL (ref 3.87–5.11)
RDW: 14.1 % (ref 11.5–15.5)
WBC: 8 10*3/uL (ref 4.0–10.5)

## 2023-12-10 LAB — LIPID PANEL
Cholesterol: 206 mg/dL — ABNORMAL HIGH (ref 0–200)
HDL: 66.2 mg/dL (ref >39.00)
LDL Cholesterol: 126 mg/dL — ABNORMAL HIGH (ref 0–99)
NonHDL: 139.98
Total CHOL/HDL Ratio: 3
Triglycerides: 68 mg/dL (ref 0.0–149.0)
VLDL: 13.6 mg/dL (ref 0.0–40.0)

## 2023-12-10 LAB — HEMOGLOBIN A1C: Hgb A1c MFr Bld: 5.7 % (ref 4.6–6.5)

## 2023-12-10 LAB — B12 AND FOLATE PANEL
Folate: 14.8 ng/mL (ref >5.9)
Vitamin B-12: >1537 pg/mL — ABNORMAL HIGH (ref 211–911)

## 2023-12-10 LAB — TSH: TSH: 2.18 u[IU]/mL (ref 0.35–5.50)

## 2023-12-10 LAB — VITAMIN D 25 HYDROXY (VIT D DEFICIENCY, FRACTURES): VITD: 53.91 ng/mL (ref 30.00–100.00)

## 2023-12-10 LAB — LDL CHOLESTEROL, DIRECT: Direct LDL: 122 mg/dL

## 2023-12-10 MED ORDER — BUSPIRONE HCL 5 MG PO TABS: 5.0000 mg | ORAL_TABLET | Freq: Three times a day (TID) | ORAL | 5 refills | Status: DC | PRN

## 2023-12-10 NOTE — Patient Instructions (Addendum)
  You are DUE for your tetanus-diptheria-pertussis vaccine   (TDaP)   Please get this done at your pharmacy ,   it will be PAID FOR MY MEDICARE ONLY AT YOUR PHARMACY   You can resume the buspar for your anxiety.  I have sent the lower  5 mg dose to your pharmacy  to start on ,  you can take it two or three times daily  We can increase the dose gradually,  if needed  the next higher dose is 7.5 mg   [facility]  Consider volunteering at the hospital;  there are lots of people your age providing a valuable service !  Return in 1 MONTH

## 2023-12-10 NOTE — Progress Notes (Unsigned)
 Patient ID: Kimberly Rojas, female    DOB: [birth year]  Age: 78 y.o. MRN: [id number]  The patient is here for annual PREVENTIVE  examination and management of other chronic and acute problems.   The risk factors are reflected in the social history.  The roster of all physicians providing medical care to patient - is listed in the Snapshot section of the chart.  Activities of daily living:  The patient is 100% independent in all ADLs: dressing, toileting, feeding as well as independent mobility  Home safety : The patient has smoke detectors in the home. They wear seatbelts.  There are no firearms at home. There is no violence in the home.   There is no risks for hepatitis, STDs or HIV. There is no   history of blood transfusion. They have no travel history to infectious disease endemic areas of the world.  The patient has seen their dentist in the last six month. They have seen their eye doctor in the last year. They admit to slight hearing difficulty with regard to whispered voices and some television programs.  They have deferred audiologic testing in the last year.  They do not  have excessive sun exposure. Discussed the need for sun protection: hats, long sleeves and use of sunscreen if there is significant sun exposure.   Diet: the importance of a healthy diet is discussed. They do have a healthy diet.  The benefits of regular aerobic exercise were discussed. She walks 4 times per week ,  20 minutes.   Depression screen: there are no signs or vegative symptoms of depression- irritability, change in appetite, anhedonia, sadness/tearfullness.  Cognitive assessment: patient's son has noticed that the patient manages all their financial and personal affairs and is actively engaged. They could relate day,date,year and events; recalled 2/3 objects at 3 minutes; performed clock-face test normally.  The following portions of the patient's history were reviewed and updated as appropriate:  allergies, current medications, past family history, past medical history,  past surgical history, past social history  and problem list.  Visual acuity was not assessed per patient preference since she has regular follow up with her ophthalmologist. Hearing and body mass index were assessed and reviewed.   During the course of the visit the patient was educated and counseled about appropriate screening and preventive services including : fall prevention , diabetes screening, nutrition counseling, colorectal cancer screening, and recommended immunizations.    CC: The primary encounter diagnosis was Subclinical hypothyroidism. Diagnoses of Pure hypercholesterolemia and Anemia, unspecified type were also pertinent to this visit.  S/p left knee replacement in Nov .    Seeing Chasnis for left SI joint pain and back pain non radiating   Depressed and lonely , lives alone.  Son lives in [place] sees hime   Elevated BP here;  at home  114/71   History Kimberly Rojas has a past medical history of Anxiety, Arthritis, Chicken pox, Dyspnea, Dysrhythmia, Fatigue, GERD (gastroesophageal reflux disease), Hair loss, Heart murmur, Heartburn, Hyperlipidemia, Migraines, Peptic ulcer, and Thyroid nodule.   She has a past surgical history that includes Cholecystectomy; Breast surgery; Shoulder open rotator cuff repair (Right, 03/31/2013); Tonsillectomy and adenoidectomy; Dilation and curettage of uterus; Breast excisional biopsy (Left, 2005-2010); Cesarean section; Knee arthroscopy with lateral menisectomy (Left, 05/18/2019); Reverse shoulder arthroplasty (Left, 03/26/2022); Appendectomy; Eye surgery (Bilateral, 01/2022); and Total knee arthroplasty (Left, 07/08/2023).   Her family history includes Arthritis in her mother; Breast cancer (age of onset: 19) in her mother; Chronic fatigue  in her mother; Diabetes in her father and paternal grandfather; Prostate cancer in her father.She reports that she has never smoked. She has never  used smokeless tobacco. She reports current alcohol use of about 1.0 standard drink of alcohol per week. She reports that she does not use drugs.  Outpatient Medications Prior to Visit  Medication Sig Dispense Refill   acetaminophen (TYLENOL) 500 MG tablet Take 500 mg by mouth every 6 (six) hours as needed (pain.).     busPIRone (BUSPAR) 7.5 MG tablet Take 1 tablet (7.5 mg total) by mouth 3 (three) times daily as needed (anxiety/nerves.). TAKE 1 TABLET(7.5 MG) BY MOUTH THREE TIMES DAILY     carboxymethylcellulose (REFRESH PLUS) 0.5 % SOLN 1-2 drops 3 (three) times daily as needed (dry/irritated eyes.).     cholecalciferol (VITAMIN D3) 25 MCG (1000 UT) tablet Take 1,000 Units by mouth in the morning.     levothyroxine (SYNTHROID) 25 MCG tablet Take 25 mcg by mouth See admin instructions. Take 2 tablets (50 mcg) by mouth on Saturdays & Sundays in the morning Take 1 tablet (25 mcg) by mouth on Mondays, through Fridays in the morning.     triamcinolone cream (KENALOG) 0.1 % Apply 1 Application topically 2 (two) times daily as needed (phlebitis (itching) of ankle).     vitamin B-12 (CYANOCOBALAMIN) 1000 MCG tablet Take 1,000 mcg by mouth in the morning.     apixaban (ELIQUIS) 2.5 MG TABS tablet Take 1 tablet (2.5 mg total) by mouth 2 (two) times daily. (Patient not taking: Reported on 12/10/2023) 30 tablet 0   docusate sodium (COLACE) 100 MG capsule Take 100 mg by mouth in the morning. (Patient not taking: Reported on 12/10/2023)     HYDROcodone-acetaminophen (NORCO/VICODIN) 5-325 MG tablet Take 1-2 tablets by mouth every 6 (six) hours as needed for moderate pain (pain score 4-6) or severe pain (pain score 7-10). (Patient not taking: Reported on 12/10/2023) 40 tablet 0   Nutritional Supplements (CHOLESTEROL MANAGEMENT PO) Take 2 capsules by mouth in the morning. CholestaCare Supplement (Patient not taking: Reported on 12/10/2023)     ondansetron (ZOFRAN) 4 MG tablet Take 1 tablet (4 mg total) by mouth every 6  (six) hours as needed for nausea. (Patient not taking: Reported on 12/10/2023) 30 tablet 0   No facility-administered medications prior to visit.    Review of Systems  Objective:  BP (!) 150/78   Pulse 64   Ht 4\' 11"  (1.499 m)   Wt 114 lb 3.2 oz (51.8 kg)   SpO2 95%   BMI 23.07 kg/m   Physical Exam  Assessment & Plan:  Subclinical hypothyroidism  Pure hypercholesterolemia  Anemia, unspecified type      I provided 40 minutes of  face-to-face time during this encounter reviewing patient's current problems and past surgeries,  recent labs and imaging studies, providing counseling on the above mentioned problems , and coordination  of care .   Follow-up: No follow-ups on file.   Sherlene Shams, MD

## 2023-12-10 NOTE — Assessment & Plan Note (Signed)
 Home readings are regularly checked and < 120/80

## 2023-12-11 ENCOUNTER — Other Ambulatory Visit: Payer: Self-pay | Admitting: Endocrinology

## 2023-12-11 ENCOUNTER — Encounter: Payer: Self-pay | Admitting: Internal Medicine

## 2023-12-11 DIAGNOSIS — E038 Other specified hypothyroidism: Secondary | ICD-10-CM

## 2023-12-11 DIAGNOSIS — G3184 Mild cognitive impairment, so stated: Secondary | ICD-10-CM | POA: Insufficient documentation

## 2023-12-11 LAB — VARICELLA ZOSTER ANTIBODY, IGG: Varicella IgG: 10 {s_co_ratio}

## 2023-12-11 NOTE — Assessment & Plan Note (Signed)
 Son has provied details of memory impairment in private.  He has agreed to have her return in one month for testing a. Screening for thyroid, b12 today are normal  Lab Results  Component Value Date   TSH 2.18 12/10/2023   Lab Results  Component Value Date   VITAMINB12 >1537 (H) 12/10/2023

## 2023-12-11 NOTE — Assessment & Plan Note (Signed)

## 2023-12-11 NOTE — Assessment & Plan Note (Addendum)
 Past trials of SSRI;s not tolerated due to persistent nausea.  Advised to resume buspirone twice daily .  Refilled today

## 2023-12-17 ENCOUNTER — Ambulatory Visit
Admission: RE | Admit: 2023-12-17 | Discharge: 2023-12-17 | Disposition: A | Discharge status: Home / Self Care | Source: Ambulatory Visit | Attending: Endocrinology | Admitting: Endocrinology

## 2023-12-17 DIAGNOSIS — E041 Nontoxic single thyroid nodule: Secondary | ICD-10-CM | POA: Diagnosis not present

## 2023-12-17 DIAGNOSIS — E038 Other specified hypothyroidism: Secondary | ICD-10-CM

## 2023-12-22 DIAGNOSIS — Z1211 Encounter for screening for malignant neoplasm of colon: Secondary | ICD-10-CM | POA: Diagnosis not present

## 2023-12-27 LAB — COLOGUARD: COLOGUARD: NEGATIVE

## 2023-12-30 ENCOUNTER — Telehealth: Payer: Self-pay

## 2023-12-30 ENCOUNTER — Ambulatory Visit (INDEPENDENT_AMBULATORY_CARE_PROVIDER_SITE_OTHER): Admitting: Internal Medicine

## 2023-12-30 ENCOUNTER — Encounter: Payer: Self-pay | Admitting: Internal Medicine

## 2023-12-30 VITALS — BP 150/64 | HR 70 | Temp 98.1°F | Ht 59.0 in | Wt 113.8 lb

## 2023-12-30 DIAGNOSIS — R300 Dysuria: Secondary | ICD-10-CM

## 2023-12-30 DIAGNOSIS — E038 Other specified hypothyroidism: Secondary | ICD-10-CM

## 2023-12-30 DIAGNOSIS — F01A11 Vascular dementia, mild, with agitation: Secondary | ICD-10-CM

## 2023-12-30 DIAGNOSIS — G3184 Mild cognitive impairment, so stated: Secondary | ICD-10-CM

## 2023-12-30 MED ORDER — BUSPIRONE HCL 7.5 MG PO TABS: 7.5000 mg | ORAL_TABLET | Freq: Three times a day (TID) | ORAL | 2 refills | Status: DC | PRN

## 2023-12-30 NOTE — Patient Instructions (Addendum)
 I think your anxiety is aggravated by your memory loss..  I am ordering an MRI of your brain and making a referral to Dr Mason Sole at [facility].  He is a neurologist who specializes in memory loss.   For your anxiety,  you can Increase the buspar  to 7.5 mg and take it  two times daily .  you may add a nighttime dose if needed

## 2023-12-30 NOTE — Addendum Note (Signed)
 Addended by: Chadwick Colonel on: 12/30/2023 04:36 PM   Modules accepted: Orders

## 2023-12-30 NOTE — Assessment & Plan Note (Signed)
 MMSE was done today ; her score was 20 .  she was unable to correctly designate the time 11:10 on a clock face.  Screening for reversible causes  was done and negative at recent CPE; Referral for MRI brain and neurology eval In progress

## 2023-12-30 NOTE — Assessment & Plan Note (Signed)
 Normal thyroid  ultrasound last week (ordered by [facility])

## 2023-12-30 NOTE — Progress Notes (Unsigned)
 Subjective:  Patient ID: Kimberly Rojas, female    DOB: September 05, 1945  Age: 78 y.o. MRN: [id number]  CC: There were no encounter diagnoses.   HPI Kimberly Rojas presents for  Chief Complaint  Patient presents with   Medical Management of Chronic Issues   Patient returns today for evaluation of memory due to son's reports of cognitive impairment    Outpatient Medications Prior to Visit  Medication Sig Dispense Refill   acetaminophen  (TYLENOL ) 500 MG tablet Take 500 mg by mouth every 6 (six) hours as needed (pain.).     busPIRone  (BUSPAR ) 5 MG tablet Take 1 tablet (5 mg total) by mouth 3 (three) times daily as needed (anxiety/nerves.). TAKE 1 TABLET(7.5 MG) BY MOUTH THREE TIMES DAILY 90 tablet 5   carboxymethylcellulose (REFRESH PLUS) 0.5 % SOLN 1-2 drops 3 (three) times daily as needed (dry/irritated eyes.).     cholecalciferol  (VITAMIN D3) 25 MCG (1000 UT) tablet Take 1,000 Units by mouth in the morning.     levothyroxine  (SYNTHROID ) 25 MCG tablet Take 25 mcg by mouth See admin instructions. Take 2 tablets (50 mcg) by mouth on Saturdays & Sundays in the morning Take 1 tablet (25 mcg) by mouth on Mondays, through Fridays in the morning.     triamcinolone  cream (KENALOG ) 0.1 % Apply 1 Application topically 2 (two) times daily as needed (phlebitis (itching) of ankle).     vitamin B-12 (CYANOCOBALAMIN ) 1000 MCG tablet Take 1,000 mcg by mouth in the morning.     No facility-administered medications prior to visit.    Review of Systems;  Patient denies headache, fevers, malaise, unintentional weight loss, skin rash, eye pain, sinus congestion and sinus pain, sore throat, dysphagia,  hemoptysis , cough, dyspnea, wheezing, chest pain, palpitations, orthopnea, edema, abdominal pain, nausea, melena, diarrhea, constipation, flank pain, dysuria, hematuria, urinary  Frequency, nocturia, numbness, tingling, seizures,  Focal weakness, Loss of consciousness,  Tremor, insomnia, depression, anxiety,  and suicidal ideation.      Objective:  BP (!) 150/64   Pulse 70   Temp 98.1 F (36.7 C) (Oral)   Ht 4\' 11"  (1.499 m)   Wt 113 lb 12.8 oz (51.6 kg)   SpO2 98%   BMI 22.98 kg/m   BP Readings from Last 3 Encounters:  12/30/23 (!) 150/64  12/10/23 120/74  07/09/23 (!) 146/63    Wt Readings from Last 3 Encounters:  12/30/23 113 lb 12.8 oz (51.6 kg)  12/10/23 114 lb 3.2 oz (51.8 kg)  07/01/23 121 lb 6.4 oz (55.1 kg)    Physical Exam  Lab Results  Component Value Date   HGBA1C 5.7 12/10/2023   HGBA1C 5.7 10/24/2015    Lab Results  Component Value Date   CREATININE 0.83 12/10/2023   CREATININE 0.78 07/01/2023   CREATININE 0.79 02/18/2023    Lab Results  Component Value Date   WBC 8.0 12/10/2023   HGB 13.5 12/10/2023   HCT 40.2 12/10/2023   PLT 227.0 12/10/2023   GLUCOSE 99 12/10/2023   CHOL 206 (H) 12/10/2023   TRIG 68.0 12/10/2023   HDL 66.20 12/10/2023   LDLDIRECT 122.0 12/10/2023   LDLCALC 126 (H) 12/10/2023   ALT 12 12/10/2023   AST 18 12/10/2023   NA 135 12/10/2023   K 3.9 12/10/2023   CL 100 12/10/2023   CREATININE 0.83 12/10/2023   BUN 18 12/10/2023   CO2 29 12/10/2023   TSH 2.18 12/10/2023   INR 1.03 03/26/2013   HGBA1C 5.7 12/10/2023  US  THYROID  Result Date: 12/17/2023 CLINICAL DATA:  Subclinical hypothyroidism EXAM: THYROID  ULTRASOUND TECHNIQUE: Ultrasound examination of the thyroid  gland and adjacent soft tissues was performed. COMPARISON:  None available. FINDINGS: Parenchymal Echotexture: Mildly heterogenous Isthmus: 0.3 cm Right lobe: 3.9 x 1.1 x 1.0 cm Left lobe: 3.7 x 0.9 x 1.2 cm _________________________________________________________ Estimated total number of nodules >/= 1 cm: 0 Number of spongiform nodules >/=  2 cm not described below (TR1): 0 Number of mixed cystic and solid nodules >/= 1.5 cm not described below (TR2): 0 _________________________________________________________ 0.7 cm cystic nodule with benign-appearing  calcification does not meet criteria for FNA or imaging follow-up. 0.3 cm left mid thyroid  nodule does not meet criteria for imaging surveillance or FNA. IMPRESSION: No significant sonographic abnormality of the thyroid . The above is in keeping with the ACR TI-RADS recommendations - J Am Coll Radiol 2017;14:587-595. Electronically Signed   By: Elester Grim M.D.   On: 12/17/2023 15:51    Assessment & Plan:  .There are no diagnoses linked to this encounter.   I spent 34 minutes on the day of this face to face encounter reviewing patient's  most recent visit with cardiology,  nephrology,  and neurology,  prior relevant surgical and non surgical procedures, recent  labs and imaging studies, counseling on weight management,  reviewing the assessment and plan with patient, and post visit ordering and reviewing of  diagnostics and therapeutics with patient  .   Follow-up: No follow-ups on file.   Thersia Flax, MD

## 2023-12-30 NOTE — Telephone Encounter (Signed)
 Copied from CRM [id number]. Topic: Clinical - Request for Lab/Test Order >> Dec 30, 2023  4:11 PM Chuck Crater wrote: Reason for CRM: Patient wants to know if Dr. Madelon Scheuermann can order her a UTI test.

## 2023-12-30 NOTE — Addendum Note (Signed)
 Addended by: Thersia Flax on: 12/30/2023 05:31 PM   Modules accepted: Orders

## 2023-12-30 NOTE — Telephone Encounter (Signed)
 I have pended order for your approval. If okay I will call pt to schedule a lab appt.

## 2023-12-31 NOTE — Telephone Encounter (Signed)
Pt is scheduled for lab appt tomorrow.

## 2024-01-01 ENCOUNTER — Other Ambulatory Visit

## 2024-01-05 ENCOUNTER — Other Ambulatory Visit (INDEPENDENT_AMBULATORY_CARE_PROVIDER_SITE_OTHER)

## 2024-01-05 DIAGNOSIS — R300 Dysuria: Secondary | ICD-10-CM

## 2024-01-06 LAB — URINE CULTURE
MICRO NUMBER:: 16412980
SPECIMEN QUALITY:: ADEQUATE

## 2024-01-06 LAB — URINALYSIS, ROUTINE W REFLEX MICROSCOPIC
Bilirubin Urine: NEGATIVE
Ketones, ur: NEGATIVE
Nitrite: NEGATIVE
Specific Gravity, Urine: 1.02 (ref 1.000–1.030)
Total Protein, Urine: NEGATIVE
Urine Glucose: NEGATIVE
Urobilinogen, UA: 0.2 (ref 0.0–1.0)
pH: 6 (ref 5.0–8.0)

## 2024-01-07 ENCOUNTER — Encounter: Payer: Self-pay | Admitting: Internal Medicine

## 2024-01-09 ENCOUNTER — Encounter (HOSPITAL_COMMUNITY): Payer: Self-pay

## 2024-01-09 ENCOUNTER — Ambulatory Visit
Admission: RE | Admit: 2024-01-09 | Discharge: 2024-01-09 | Disposition: A | Discharge status: Home / Self Care | Source: Ambulatory Visit | Attending: Internal Medicine | Admitting: Internal Medicine

## 2024-01-09 DIAGNOSIS — F01A11 Vascular dementia, mild, with agitation: Secondary | ICD-10-CM | POA: Insufficient documentation

## 2024-01-09 DIAGNOSIS — I6782 Cerebral ischemia: Secondary | ICD-10-CM | POA: Diagnosis not present

## 2024-01-09 DIAGNOSIS — G319 Degenerative disease of nervous system, unspecified: Secondary | ICD-10-CM | POA: Diagnosis not present

## 2024-01-10 ENCOUNTER — Encounter: Payer: Self-pay | Admitting: Internal Medicine

## 2024-01-16 ENCOUNTER — Encounter: Payer: Self-pay | Admitting: Internal Medicine

## 2024-01-16 ENCOUNTER — Ambulatory Visit (INDEPENDENT_AMBULATORY_CARE_PROVIDER_SITE_OTHER): Admitting: Internal Medicine

## 2024-01-16 VITALS — BP 146/64 | HR 62 | Ht 59.0 in | Wt 114.2 lb

## 2024-01-16 DIAGNOSIS — R030 Elevated blood-pressure reading, without diagnosis of hypertension: Secondary | ICD-10-CM

## 2024-01-16 DIAGNOSIS — G8929 Other chronic pain: Secondary | ICD-10-CM

## 2024-01-16 DIAGNOSIS — G3184 Mild cognitive impairment, so stated: Secondary | ICD-10-CM | POA: Diagnosis not present

## 2024-01-16 DIAGNOSIS — F411 Generalized anxiety disorder: Secondary | ICD-10-CM | POA: Diagnosis not present

## 2024-01-16 DIAGNOSIS — M546 Pain in thoracic spine: Secondary | ICD-10-CM

## 2024-01-16 DIAGNOSIS — E785 Hyperlipidemia, unspecified: Secondary | ICD-10-CM | POA: Diagnosis not present

## 2024-01-16 DIAGNOSIS — E038 Other specified hypothyroidism: Secondary | ICD-10-CM | POA: Diagnosis not present

## 2024-01-16 MED ORDER — ROSUVASTATIN CALCIUM 10 MG PO TABS: 10.0000 mg | ORAL_TABLET | Freq: Every day | ORAL | 2 refills | Status: DC

## 2024-01-16 MED ORDER — AMLODIPINE BESYLATE 2.5 MG PO TABS: 2.5000 mg | ORAL_TABLET | Freq: Every day | ORAL | 1 refills | Status: DC

## 2024-01-16 NOTE — Patient Instructions (Addendum)
 I would like you to increase the buspirone  to 1.5 tablets twice daily to help your anxiety   I would like you to start going for a walk every day  for exercise   Your right sided pain is due to muscle strain.  Start exercising again to strengthen your back and oblique muscles  Your MRI suggests that the brain is not getting enough blood flow at times.  I would like you start a cholesterol medication and return in 3 weeks for blood tests

## 2024-01-16 NOTE — Progress Notes (Signed)
 Subjective:  Patient ID: Kimberly Rojas, female    DOB: 1945-12-31  Age: 78 y.o. MRN: [id number]  CC: The primary encounter diagnosis was Hyperlipidemia LDL goal <100. Diagnoses of Generalized anxiety disorder, Mild cognitive impairment with memory loss, White coat syndrome without diagnosis of hypertension, and Chronic right-sided thoracic back pain were also pertinent to this visit.   HPI Kimberly Rojas presents for  Chief Complaint  Patient presents with   Medical Management of Chronic Issues    1 month follow up    Patient is a 78 yr old female with dementia of unclear etiology  here to review the results of brain MRI done recently in the setting of cognitive decline with agitation. She is accompanied by a friend .  She reports persistent anxiety and as difficulty separating past from present.  She continue to endorse anxiety about her sons not coming home, one of whom is dead and one of whom lives in [place]  MRI Results reviewed:  No evidence of an acute intracranial abnormality. 2. Chronic small vessel ischemic changes which are moderate in the cerebral white matter and minimal in the pons. Findings have slightly progressed from the brain MRI of 09/25/2018. 3. Mild generalized cerebral atrophy.  She is unaware that she has an appt with Dr Mason Sole on June 27  2) Elevated blood pressure readings:  noted for the last several visits.  Reading was 129/68  in March at [facility] .  Home readings have been < 130/80  but has not been checked in 2-3 weeks    3) right sided rib pain intermittent ,  riibs are tender to touch .  Aggravated by housework    Outpatient Medications Prior to Visit  Medication Sig Dispense Refill   acetaminophen  (TYLENOL ) 500 MG tablet Take 500 mg by mouth every 6 (six) hours as needed (pain.).     busPIRone  (BUSPAR ) 7.5 MG tablet Take 1 tablet (7.5 mg total) by mouth 3 (three) times daily as needed (anxiety/nerves.). 90 tablet 2   carboxymethylcellulose (REFRESH  PLUS) 0.5 % SOLN 1-2 drops 3 (three) times daily as needed (dry/irritated eyes.).     cholecalciferol  (VITAMIN D3) 25 MCG (1000 UT) tablet Take 1,000 Units by mouth in the morning.     levothyroxine  (SYNTHROID ) 25 MCG tablet Take 25 mcg by mouth See admin instructions. Take 2 tablets (50 mcg) by mouth on Saturdays & Sundays in the morning Take 1 tablet (25 mcg) by mouth on Mondays, through Fridays in the morning.     triamcinolone  cream (KENALOG ) 0.1 % Apply 1 Application topically 2 (two) times daily as needed (phlebitis (itching) of ankle).     vitamin B-12 (CYANOCOBALAMIN ) 1000 MCG tablet Take 1,000 mcg by mouth in the morning.     No facility-administered medications prior to visit.    Review of Systems;  Patient denies headache, fevers, malaise, unintentional weight loss, skin rash, eye pain, sinus congestion and sinus pain, sore throat, dysphagia,  hemoptysis , cough, dyspnea, wheezing, chest pain, palpitations, orthopnea, edema, abdominal pain, nausea, melena, diarrhea, constipation, flank pain, dysuria, hematuria, urinary  Frequency, nocturia, numbness, tingling, seizures,  Focal weakness, Loss of consciousness,  Tremor, insomnia, depression, anxiety, and suicidal ideation.      Objective:  BP (!) 146/64   Pulse 62   Ht 4\' 11"  (1.499 m)   Wt 114 lb 3.2 oz (51.8 kg)   SpO2 99%   BMI 23.07 kg/m   BP Readings from Last 3 Encounters:  01/16/24 Aaron Aas)  146/64  12/30/23 (!) 150/64  12/10/23 120/74    Wt Readings from Last 3 Encounters:  01/16/24 114 lb 3.2 oz (51.8 kg)  12/30/23 113 lb 12.8 oz (51.6 kg)  12/10/23 114 lb 3.2 oz (51.8 kg)    Physical Exam Vitals reviewed.  Constitutional:      General: She is not in acute distress.    Appearance: Normal appearance. She is normal weight. She is not ill-appearing, toxic-appearing or diaphoretic.  HENT:     Head: Normocephalic.  Eyes:     General: No scleral icterus.       Right eye: No discharge.        Left eye: No  discharge.     Conjunctiva/sclera: Conjunctivae normal.  Cardiovascular:     Rate and Rhythm: Normal rate and regular rhythm.     Heart sounds: Normal heart sounds.  Pulmonary:     Effort: Pulmonary effort is normal. No respiratory distress.     Breath sounds: Normal breath sounds.  Musculoskeletal:        General: Normal range of motion.  Skin:    General: Skin is warm and dry.  Neurological:     General: No focal deficit present.     Mental Status: She is alert and oriented to person, place, and time. Mental status is at baseline.  Psychiatric:        Mood and Affect: Mood normal.        Behavior: Behavior normal.        Thought Content: Thought content normal.        Judgment: Judgment normal.    Lab Results  Component Value Date   HGBA1C 5.7 12/10/2023   HGBA1C 5.7 10/24/2015    Lab Results  Component Value Date   CREATININE 0.83 12/10/2023   CREATININE 0.78 07/01/2023   CREATININE 0.79 02/18/2023    Lab Results  Component Value Date   WBC 8.0 12/10/2023   HGB 13.5 12/10/2023   HCT 40.2 12/10/2023   PLT 227.0 12/10/2023   GLUCOSE 99 12/10/2023   CHOL 206 (H) 12/10/2023   TRIG 68.0 12/10/2023   HDL 66.20 12/10/2023   LDLDIRECT 122.0 12/10/2023   LDLCALC 126 (H) 12/10/2023   ALT 12 12/10/2023   AST 18 12/10/2023   NA 135 12/10/2023   K 3.9 12/10/2023   CL 100 12/10/2023   CREATININE 0.83 12/10/2023   BUN 18 12/10/2023   CO2 29 12/10/2023   TSH 2.18 12/10/2023   INR 1.03 03/26/2013   HGBA1C 5.7 12/10/2023    MR Brain Wo Contrast Result Date: 01/09/2024 CLINICAL DATA:  Provided history: Mild vascular dementia with agitation. Dementia, vascular etiology suspected. EXAM: MRI HEAD WITHOUT CONTRAST TECHNIQUE: Multiplanar, multiecho pulse sequences of the brain and surrounding structures were obtained without intravenous contrast. COMPARISON:  Brain MRI 09/25/2018. FINDINGS: Brain: Mild generalized cerebral atrophy. Multifocal T2 FLAIR hyperintense signal  abnormality within the cerebral white matter, nonspecific but compatible with moderate chronic small vessel ischemic disease. Minimal chronic small vessel ischemic changes within the pons. No cortical encephalomalacia is identified. There is no acute infarct. No evidence of an intracranial mass. No extra-axial fluid collection. No midline shift. Vascular: Maintained flow voids within the proximal large arterial vessels. Skull and upper cervical spine: No focal worrisome marrow lesion. Incompletely assessed cervical spondylosis. Sinuses/Orbits: No mass or acute finding within the imaged orbits. Prior bilateral ocular lens replacement. IMPRESSION: 1.  No evidence of an acute intracranial abnormality. 2. Chronic small vessel ischemic changes which  are moderate in the cerebral white matter and minimal in the pons. Findings have slightly progressed from the brain MRI of 09/25/2018. 3. Mild generalized cerebral atrophy. Electronically Signed   By: Bascom Lily D.O.   On: 01/09/2024 17:07    Assessment & Plan:  .Hyperlipidemia LDL goal <100 -     Comprehensive metabolic panel with GFR; Future  Generalized anxiety disorder Assessment & Plan: Increase buspirone  to 1.5 mg twice daily for recurrent agitation  Will consider adding seroquel if needed    Mild cognitive impairment with memory loss Assessment & Plan: MMSE score at last vsiit  was 20 .  she was unable to correctly designate the time 11:10 on a clock face.  Screening for reversible causes  was done and negative at recent CPE; MRI suggests vascular etiology .   Referral to neurology In progress    White coat syndrome without diagnosis of hypertension Assessment & Plan: Home readings are regularly checked and < 120/80   Chronic right-sided thoracic back pain Assessment & Plan: Exam normal.  Musculoskeletal origin presumed.     Other orders -     Rosuvastatin  Calcium ; Take 1 tablet (10 mg total) by mouth daily.  Dispense: 30 tablet; Refill:  2     I spent 34 minutes on the day of this face to face encounter reviewing patient's recent  labs and imaging studies, reviewing the assessment and plan with patient, and post visit ordering and reviewing of  diagnostics and therapeutics with patient  .   Follow-up: Return in about 7 weeks (around 03/03/2024).   Thersia Flax, MD

## 2024-01-17 DIAGNOSIS — M546 Pain in thoracic spine: Secondary | ICD-10-CM | POA: Insufficient documentation

## 2024-01-17 NOTE — Assessment & Plan Note (Signed)
 Increase buspirone  to 1.5 mg twice daily for recurrent agitation  Will consider adding seroquel if needed

## 2024-01-17 NOTE — Assessment & Plan Note (Signed)
 Home readings are regularly checked and < 120/80

## 2024-01-17 NOTE — Assessment & Plan Note (Signed)
 MMSE score at last vsiit  was 20 .  she was unable to correctly designate the time 11:10 on a clock face.  Screening for reversible causes  was done and negative at recent CPE; MRI suggests vascular etiology .   Referral to neurology In progress

## 2024-01-17 NOTE — Assessment & Plan Note (Signed)
 Exam normal.  Musculoskeletal origin presumed.

## 2024-01-30 DIAGNOSIS — E038 Other specified hypothyroidism: Secondary | ICD-10-CM | POA: Diagnosis not present

## 2024-01-30 DIAGNOSIS — E042 Nontoxic multinodular goiter: Secondary | ICD-10-CM | POA: Diagnosis not present

## 2024-01-30 DIAGNOSIS — R030 Elevated blood-pressure reading, without diagnosis of hypertension: Secondary | ICD-10-CM | POA: Diagnosis not present

## 2024-01-30 DIAGNOSIS — K219 Gastro-esophageal reflux disease without esophagitis: Secondary | ICD-10-CM | POA: Diagnosis not present

## 2024-01-30 DIAGNOSIS — E7800 Pure hypercholesterolemia, unspecified: Secondary | ICD-10-CM | POA: Diagnosis not present

## 2024-02-06 ENCOUNTER — Other Ambulatory Visit (INDEPENDENT_AMBULATORY_CARE_PROVIDER_SITE_OTHER)

## 2024-02-06 DIAGNOSIS — E785 Hyperlipidemia, unspecified: Secondary | ICD-10-CM

## 2024-02-07 ENCOUNTER — Ambulatory Visit: Payer: Self-pay | Admitting: Internal Medicine

## 2024-02-07 LAB — COMPREHENSIVE METABOLIC PANEL WITH GFR
ALT: 16 IU/L (ref 0–32)
AST: 26 IU/L (ref 0–40)
Albumin: 4.2 g/dL (ref 3.8–4.8)
Alkaline Phosphatase: 89 IU/L (ref 44–121)
BUN/Creatinine Ratio: 17 (ref 12–28)
BUN: 12 mg/dL (ref 8–27)
Bilirubin Total: 0.5 mg/dL (ref 0.0–1.2)
CO2: 24 mmol/L (ref 20–29)
Calcium: 9.2 mg/dL (ref 8.7–10.3)
Chloride: 102 mmol/L (ref 96–106)
Creatinine, Ser: 0.72 mg/dL (ref 0.57–1.00)
Globulin, Total: 2.4 g/dL (ref 1.5–4.5)
Glucose: 133 mg/dL — ABNORMAL HIGH (ref 70–99)
Potassium: 3.8 mmol/L (ref 3.5–5.2)
Sodium: 141 mmol/L (ref 134–144)
Total Protein: 6.6 g/dL (ref 6.0–8.5)
eGFR: 85 mL/min/{1.73_m2} (ref >59)

## 2024-02-12 ENCOUNTER — Other Ambulatory Visit: Payer: Self-pay | Admitting: Internal Medicine

## 2024-02-12 MED ORDER — BUSPIRONE HCL 7.5 MG PO TABS: 7.5000 mg | ORAL_TABLET | Freq: Three times a day (TID) | ORAL | 0 refills | Status: DC | PRN

## 2024-02-12 NOTE — Telephone Encounter (Signed)
 Last Fill: 12/30/23  Last OV: 01/16/24 Next OV: 03/23/24  Routing to provider for review/authorization.

## 2024-02-12 NOTE — Telephone Encounter (Signed)
 Copied from CRM [id number]. Topic: Clinical - Medication Refill >> Feb 12, 2024 10:23 AM Martinique E wrote: Medication: busPIRone  (BUSPAR ) 7.5 MG tablet   Has the patient contacted their pharmacy? No (Agent: If no, request that the patient contact the pharmacy for the refill. If patient does not wish to contact the pharmacy document the reason why and proceed with request.) (Agent: If yes, when and what did the pharmacy advise?)  This is the patient's preferred pharmacy:  Patient is currently in [place]  and would like the medication to go to:  [facility] [address]. [place], [place]  [phone]  Is this the correct pharmacy for this prescription? Yes If no, delete pharmacy and type the correct one.   Has the prescription been filled recently? No  Is the patient out of the medication? Yes  Has the patient been seen for an appointment in the last year OR does the patient have an upcoming appointment? Yes  Can we respond through MyChart? Yes  Agent: Please be advised that Rx refills may take up to 3 business days. We ask that you follow-up with your pharmacy.

## 2024-02-17 ENCOUNTER — Telehealth: Payer: Self-pay | Admitting: Internal Medicine

## 2024-02-17 NOTE — Telephone Encounter (Signed)
 Copied from CRM [id number]. Topic: Clinical - Medication Refill >> Feb 17, 2024 11:33 AM Antonieta Kitten wrote: Medication: levothyroxine  (SYNTHROID ) 25 MCG tablet  Has the patient contacted their pharmacy? No (Agent: If no, request that the patient contact the pharmacy for the refill. If patient does not wish to contact the pharmacy document the reason why and proceed with request.) (Agent: If yes, when and what did the pharmacy advise?)   [id number] [place], [place] - [address] [place] [address] Phone: [phone] Fax: [phone]  Is this the correct pharmacy for this prescription? No If no, delete pharmacy and type the correct one.   Has the prescription been filled recently? No  Is the patient out of the medication? Yes  Has the patient been seen for an appointment in the last year OR does the patient have an upcoming appointment? Yes  Can we respond through MyChart? Yes  Agent: Please be advised that Rx refills may take up to 3 business days. We ask that you follow-up with your pharmacy.

## 2024-02-19 NOTE — Telephone Encounter (Signed)
 Copied from CRM [id number]. Topic: Clinical - Prescription Issue >> Feb 19, 2024  2:55 PM Kimberly Rojas wrote: Reason for CRM: Patient has been without her medication for 3 days because her refill request was never sent to the pharmacy in [place]  and now patient is going back home on Saturday to [place]  and is requesting her prescription is sent to [id number] [place], [place] - [address] AT [facility] [address] [phone].

## 2024-02-19 NOTE — Telephone Encounter (Signed)
 Spoke with pt and pt's daughter about the refill request. I let them know that this medication is prescribed by Dr. Balan and they would need to reach out to that office for a refill request. The number to that office was given to daughter.

## 2024-02-25 DIAGNOSIS — H9190 Unspecified hearing loss, unspecified ear: Secondary | ICD-10-CM | POA: Diagnosis not present

## 2024-02-25 DIAGNOSIS — G939 Disorder of brain, unspecified: Secondary | ICD-10-CM | POA: Diagnosis not present

## 2024-02-25 DIAGNOSIS — R413 Other amnesia: Secondary | ICD-10-CM | POA: Diagnosis not present

## 2024-03-03 ENCOUNTER — Ambulatory Visit: Payer: Self-pay

## 2024-03-03 NOTE — Telephone Encounter (Signed)
 LMTCB. Please relay Dr. Lula message below when pt returns the call.

## 2024-03-03 NOTE — Telephone Encounter (Signed)
 FYI Only or Action Required?: Action required by provider: clinical question for provider and lab or test result follow-up needed.  Patient was last seen in primary care on 01/16/2024 by Marylynn Verneita CROME, MD. Called Nurse Triage reporting Hematuria. Symptoms began several days ago. Interventions attempted:   Triage Disposition: Home Care  Patient/caregiver understands and will follow disposition?: No, wishes to speak with PCP  Pt has lab work done at the [place] clinic and they called and told her she has blood in her urine and she has questions about why.    Copied from CRM [id number]. Topic: Clinical - Red Word Triage >> Mar 03, 2024  1:42 PM Chasity T wrote: [facility] that prompted transfer to Nurse Triage: patient and got labs done and they notice blood in urine Reason for Disposition  Positive urine test for blood, questions about  Answer Assessment - Initial Assessment Questions 1. TEST: What kind of urine test was performed? (e.g., urinalysis, urine dipstick)      urinalysis 2. URINALYSIS RESULT: Was it positive or negative?  If positive, document what was positive (e.g., LE, WBC, RBC, bacteria, epithelial cells)     Positive for blood +1 and Leukocytes 3. FEVER: Do you have a fever? If Yes, ask: What is your temperature, how was it measured, and when did it start?     no 4. FLANK PAIN: Do you have any pain in your side?     no 5. OTHER SYMPTOMS: Do you have any other symptoms? (e.g., blood in urine, vomiting)     Sometimes has burning off and on but it has been a while ago 7. PHENAZOPYRIDINE (Uristat, Pyridium): Have you taken phenazopyridine (turns your urine orange) recently?     no  Protocols used: Urinalysis Results Follow-up Call-A-AH

## 2024-03-03 NOTE — Telephone Encounter (Signed)
 Pt called and stated that she had a UA done at [facility] clinic and it showed that she had some blood in her urine. Pt is wanting to know why she has blood in her urine. Does pt need to schedule an appointment to be evaluated or would you like for pt to submit a urine here at our office for a UA, micro and culture?

## 2024-03-04 NOTE — Telephone Encounter (Signed)
 noted

## 2024-03-04 NOTE — Telephone Encounter (Unsigned)
 Copied from CRM [id number]. Topic: Clinical - Lab/Test Results >> Mar 04, 2024  9:45 AM Burnard DEL wrote: Reason for CRM: Patient returned call regarding UA.I relayed message from Dr Marylynn regarding the false positive.She verbalized understanding.

## 2024-03-23 ENCOUNTER — Encounter: Payer: Self-pay | Admitting: Internal Medicine

## 2024-03-23 ENCOUNTER — Ambulatory Visit (INDEPENDENT_AMBULATORY_CARE_PROVIDER_SITE_OTHER): Admitting: Internal Medicine

## 2024-03-23 VITALS — BP 128/60 | HR 76 | Ht 59.0 in | Wt 108.2 lb

## 2024-03-23 DIAGNOSIS — R634 Abnormal weight loss: Secondary | ICD-10-CM

## 2024-03-23 DIAGNOSIS — I700 Atherosclerosis of aorta: Secondary | ICD-10-CM

## 2024-03-23 DIAGNOSIS — E039 Hypothyroidism, unspecified: Secondary | ICD-10-CM | POA: Diagnosis not present

## 2024-03-23 DIAGNOSIS — J069 Acute upper respiratory infection, unspecified: Secondary | ICD-10-CM | POA: Diagnosis not present

## 2024-03-23 DIAGNOSIS — E538 Deficiency of other specified B group vitamins: Secondary | ICD-10-CM

## 2024-03-23 DIAGNOSIS — G3184 Mild cognitive impairment, so stated: Secondary | ICD-10-CM | POA: Diagnosis not present

## 2024-03-23 DIAGNOSIS — F411 Generalized anxiety disorder: Secondary | ICD-10-CM | POA: Diagnosis not present

## 2024-03-23 MED ORDER — BENZONATATE 200 MG PO CAPS: 200.0000 mg | ORAL_CAPSULE | Freq: Two times a day (BID) | ORAL | 0 refills | Status: AC | PRN

## 2024-03-23 MED ORDER — BUSPIRONE HCL 7.5 MG PO TABS: 7.5000 mg | ORAL_TABLET | Freq: Three times a day (TID) | ORAL | 1 refills | Status: AC | PRN

## 2024-03-23 MED ORDER — ROSUVASTATIN CALCIUM 10 MG PO TABS: 10.0000 mg | ORAL_TABLET | Freq: Every day | ORAL | 1 refills | Status: AC

## 2024-03-23 NOTE — Patient Instructions (Addendum)
 Please expect a call from [facility] value based care initiative  I have made this referral because I want a Child psychotherapist and a nurse to talk with you about your health and your anxiety   Please supplement your diet with ensure bars or pudding,  you have lost 14 lbs since last July and this is not good!

## 2024-03-23 NOTE — Progress Notes (Unsigned)
 Subjective:  Patient ID: Kimberly Rojas, female    DOB: 1946/01/23  Age: 78 y.o. MRN: [id number]  CC: There were no encounter diagnoses.   HPI Kimberly Rojas presents for  Chief Complaint  Patient presents with   Medical Management of Chronic Issues   1) Dementia with anxiety:  seen in May for cognitive changes.  MRI Brain done and neurology evaluation done June 25: MRI showed moderate progression of ischemic changes since 2020, affecting memory, balance, gait, and speech. Risk factors include age and reduced microvascular blood flow.  SLUMS score was 3/30,  additional labs and follow up in 2 months was advised.  She has lost 14 lbs over the last 12 months which is > 10%  (122,  now 108)    2) URI symptoms started yesterday: cough and sore throat    Outpatient Medications Prior to Visit  Medication Sig Dispense Refill   acetaminophen  (TYLENOL ) 500 MG tablet Take 500 mg by mouth every 6 (six) hours as needed (pain.).     busPIRone  (BUSPAR ) 7.5 MG tablet Take 1 tablet (7.5 mg total) by mouth 3 (three) times daily as needed (anxiety/nerves.). 90 tablet 0   carboxymethylcellulose (REFRESH PLUS) 0.5 % SOLN 1-2 drops 3 (three) times daily as needed (dry/irritated eyes.).     cholecalciferol  (VITAMIN D3) 25 MCG (1000 UT) tablet Take 1,000 Units by mouth in the morning.     levothyroxine  (SYNTHROID ) 25 MCG tablet Take 25 mcg by mouth See admin instructions. Take 2 tablets (50 mcg) by mouth on Saturdays & Sundays in the morning Take 1 tablet (25 mcg) by mouth on Mondays, through Fridays in the morning.     rosuvastatin  (CRESTOR ) 10 MG tablet Take 1 tablet (10 mg total) by mouth daily. 30 tablet 2   triamcinolone  cream (KENALOG ) 0.1 % Apply 1 Application topically 2 (two) times daily as needed (phlebitis (itching) of ankle).     vitamin B-12 (CYANOCOBALAMIN ) 1000 MCG tablet Take 1,000 mcg by mouth in the morning.     No facility-administered medications prior to visit.    Review of  Systems;  Patient denies headache, fevers, malaise, unintentional weight loss, skin rash, eye pain, sinus congestion and sinus pain, sore throat, dysphagia,  hemoptysis , cough, dyspnea, wheezing, chest pain, palpitations, orthopnea, edema, abdominal pain, nausea, melena, diarrhea, constipation, flank pain, dysuria, hematuria, urinary  Frequency, nocturia, numbness, tingling, seizures,  Focal weakness, Loss of consciousness,  Tremor, insomnia, depression, anxiety, and suicidal ideation.      Objective:  BP 128/60   Pulse 76   Ht 4' 11 (1.499 m)   Wt 108 lb 3.2 oz (49.1 kg)   SpO2 97%   BMI 21.85 kg/m   BP Readings from Last 3 Encounters:  03/23/24 128/60  01/16/24 (!) 146/64  12/30/23 (!) 150/64    Wt Readings from Last 3 Encounters:  03/23/24 108 lb 3.2 oz (49.1 kg)  01/16/24 114 lb 3.2 oz (51.8 kg)  12/30/23 113 lb 12.8 oz (51.6 kg)    Physical Exam  Lab Results  Component Value Date   HGBA1C 5.7 12/10/2023   HGBA1C 5.7 10/24/2015    Lab Results  Component Value Date   CREATININE 0.72 02/06/2024   CREATININE 0.83 12/10/2023   CREATININE 0.78 07/01/2023    Lab Results  Component Value Date   WBC 8.0 12/10/2023   HGB 13.5 12/10/2023   HCT 40.2 12/10/2023   PLT 227.0 12/10/2023   GLUCOSE 133 (H) 02/06/2024   CHOL  206 (H) 12/10/2023   TRIG 68.0 12/10/2023   HDL 66.20 12/10/2023   LDLDIRECT 122.0 12/10/2023   LDLCALC 126 (H) 12/10/2023   ALT 16 02/06/2024   AST 26 02/06/2024   NA 141 02/06/2024   K 3.8 02/06/2024   CL 102 02/06/2024   CREATININE 0.72 02/06/2024   BUN 12 02/06/2024   CO2 24 02/06/2024   TSH 2.18 12/10/2023   INR 1.03 03/26/2013   HGBA1C 5.7 12/10/2023    MR Brain Wo Contrast Result Date: 01/09/2024 CLINICAL DATA:  Provided history: Mild vascular dementia with agitation. Dementia, vascular etiology suspected. EXAM: MRI HEAD WITHOUT CONTRAST TECHNIQUE: Multiplanar, multiecho pulse sequences of the brain and surrounding structures were  obtained without intravenous contrast. COMPARISON:  Brain MRI 09/25/2018. FINDINGS: Brain: Mild generalized cerebral atrophy. Multifocal T2 FLAIR hyperintense signal abnormality within the cerebral white matter, nonspecific but compatible with moderate chronic small vessel ischemic disease. Minimal chronic small vessel ischemic changes within the pons. No cortical encephalomalacia is identified. There is no acute infarct. No evidence of an intracranial mass. No extra-axial fluid collection. No midline shift. Vascular: Maintained flow voids within the proximal large arterial vessels. Skull and upper cervical spine: No focal worrisome marrow lesion. Incompletely assessed cervical spondylosis. Sinuses/Orbits: No mass or acute finding within the imaged orbits. Prior bilateral ocular lens replacement. IMPRESSION: 1.  No evidence of an acute intracranial abnormality. 2. Chronic small vessel ischemic changes which are moderate in the cerebral white matter and minimal in the pons. Findings have slightly progressed from the brain MRI of 09/25/2018. 3. Mild generalized cerebral atrophy. Electronically Signed   By: Rockey Childs D.O.   On: 01/09/2024 17:07    Assessment & Plan:  .There are no diagnoses linked to this encounter.   I spent 34 minutes on the day of this face to face encounter reviewing patient's  most recent visit with cardiology,  nephrology,  and neurology,  prior relevant surgical and non surgical procedures, recent  labs and imaging studies, counseling on weight management,  reviewing the assessment and plan with patient, and post visit ordering and reviewing of  diagnostics and therapeutics with patient  .   Follow-up: No follow-ups on file.   Kimberly LITTIE Kettering, MD

## 2024-03-24 DIAGNOSIS — J069 Acute upper respiratory infection, unspecified: Secondary | ICD-10-CM | POA: Insufficient documentation

## 2024-03-24 NOTE — Assessment & Plan Note (Signed)
 Improved symptoms with increase in  buspirone  to 1.5 mg twice daily for recurrent agitation

## 2024-03-24 NOTE — Assessment & Plan Note (Signed)
 Dementia confirmed. Prognosis unclear as workup for AD is under way by neurology.  VBCI referrla made for nursing and Social work given her weight loss and need for increaesed socialization.

## 2024-03-24 NOTE — Assessment & Plan Note (Addendum)
 Normal thyroid  ultrasound (ordered by Endocrine), normal TSH on current levothyroxine  managed by Endocrine  Lab Results  Component Value Date   TSH 2.18 12/10/2023

## 2024-03-24 NOTE — Assessment & Plan Note (Signed)
Noted on  previous  chest CT done by pulmonology.  Reviewed findings of prior CT scan today..  She is tolerating high potency  statin therapy starting with 10 rosuvastatin 2 times weekly

## 2024-03-24 NOTE — Assessment & Plan Note (Signed)
 She has lost over 10% of body weight during the past 12 months despite taking a daily ensure as a supplement to meals.  Encouraged to drink or eat a second supplement daily.

## 2024-03-24 NOTE — Assessment & Plan Note (Signed)
 IF ab negative.   Continue oral supplementation   Lab Results  Component Value Date   VITAMINB12 >1537 (H) 12/10/2023

## 2024-03-24 NOTE — Assessment & Plan Note (Signed)
 She appears well.  Continue supportive care

## 2024-03-26 ENCOUNTER — Ambulatory Visit: Payer: Self-pay

## 2024-03-26 ENCOUNTER — Other Ambulatory Visit: Payer: Self-pay | Admitting: Internal Medicine

## 2024-03-26 MED ORDER — GUAIFENESIN-CODEINE 100-10 MG/5ML PO SOLN: 10.0000 mL | Freq: Three times a day (TID) | ORAL | 0 refills | Status: AC | PRN

## 2024-03-26 NOTE — Telephone Encounter (Signed)
  FYI Only or Action Required?: Action required by provider: clinical question for provider, update on patient condition, and patient wants medication call in for her upper respiratory symptoms/congestion/cough.  Patient was last seen in primary care on 03/23/2024 by Marylynn Verneita CROME, MD.  Called Nurse Triage reporting Cough.  Symptoms began a few days ago but worse overnight.  Interventions attempted: Rest, hydration, or home remedies.  Symptoms are: rapidly worsening.  Triage Disposition: See Physician Within 24 Hours  Patient/caregiver understands and will follow disposition?: No, wishes to speak with PCP                     Copied from CRM [id number]. Topic: Clinical - Red Word Triage >> Mar 26, 2024  1:06 PM Kimberly Rojas wrote: Red Word that prompted transfer to Nurse Triage: Bad cold that's gotten worse. Reason for Disposition  [1] Continuous (nonstop) coughing interferes with work or school AND [2] no improvement using cough treatment per Care Advice  Answer Assessment - Initial Assessment Questions Patient states that she just saw her PCP a few days ago and felt bad at that time and she states that she got worse overnight She states she had a fever a few days ago and she doesn't know if she has one now but she woke up this morning covered in sweat She states she is blowing yellow mucous out of her nose and still coughing. Patient feels a lot of head and nasal congestion Patient is advised that it is recommended that she be seen and evaluated She did not want to go to Urgent Care or any of our other offices to be evaluated She states she just wanted her PCP to call in a prescription for her congestion or cough or something to help her feel like like she has in the past Patient doesn't remember what has been called in for her in the past but she doesn't want an appointment at this time         1. ONSET: When did the cough begin?      A few days ago 2.  SEVERITY: How bad is the cough today?      ----- 3. SPUTUM: Describe the color of your sputum (e.g., none, dry cough; clear, white, yellow, green)     Yellow 4. HEMOPTYSIS: Are you coughing up any blood? If Yes, ask: How much? (e.g., flecks, streaks, tablespoons, etc.)     no 5. DIFFICULTY BREATHING: Are you having difficulty breathing? If Yes, ask: How bad is it? (e.g., mild, moderate, severe)      no 6. FEVER: Do you have a fever? If Yes, ask: What is your temperature, how was it measured, and when did it start?     Wednesday pt states she had a fever 100-101 7. CARDIAC HISTORY: Do you have any history of heart disease? (e.g., heart attack, congestive heart failure)      significant 8. LUNG HISTORY: Do you have any history of lung disease?  (e.g., pulmonary embolus, asthma, emphysema)     No per patient 9. PE RISK FACTORS: Do you have a history of blood clots? (or: recent major surgery, recent prolonged travel, bedridden)     no 10. OTHER SYMPTOMS: Do you have any other symptoms? (e.g., runny nose, wheezing, chest pain)       Head congestion, nasal congestion, generalized malaise  Protocols used: Cough - Acute Non-Productive-A-AH

## 2024-03-30 ENCOUNTER — Ambulatory Visit: Payer: Self-pay

## 2024-03-30 ENCOUNTER — Other Ambulatory Visit: Payer: Self-pay | Admitting: Nurse Practitioner

## 2024-03-30 ENCOUNTER — Telehealth: Payer: Self-pay

## 2024-03-30 DIAGNOSIS — R300 Dysuria: Secondary | ICD-10-CM

## 2024-03-30 NOTE — Telephone Encounter (Signed)
 Spoke with pt and scheduled her for a lab appt for tomorrow.

## 2024-03-30 NOTE — Telephone Encounter (Signed)
 I have ordered labs for culture and UA

## 2024-03-30 NOTE — Telephone Encounter (Addendum)
 FYI Only or Action Required?: FYI only for provider.  Patient was last seen in primary care on 03/23/2024 by Marylynn Verneita CROME, MD.  Called Nurse Triage reporting Dysuria.  Symptoms began today.  Interventions attempted: Nothing.  Symptoms are: no pain with most recent urination.  Triage Disposition: See Physician Within 24 Hours  Patient/caregiver understands and will follow disposition?: Yes  Earliest appointment scheduled in 2 days on 7/31   Summary: UTI   Patient has an UTI, no appointments available until Thursday but she needs a prescription today.        1. SEVERITY: How bad is the pain?  (e.g., Scale 1-10; mild, moderate, or severe)     Mild pain, no pain for last urinatoin  2. FREQUENCY: How many times have you had painful urination today?      3 times  3. PATTERN: Is pain present every time you urinate or just sometimes?      Intermittent  4. ONSET: When did the painful urination start?      Today  5. FEVER: Do you have a fever? If Yes, ask: What is your temperature, how was it measured, and when did it start?     No 6. PAST UTI: Have you had a urine infection before? If Yes, ask: When was the last time? and What happened that time?      No 7. CAUSE: What do you think is causing the painful urination?  (e.g., UTI, scratch, Herpes sore)     UTI 8. OTHER SYMPTOMS: Do you have any other symptoms? (e.g., blood in urine, flank pain, genital sores, urgency, vaginal discharge)     Nasal congestion

## 2024-03-30 NOTE — Progress Notes (Signed)
 Complex Care Management Note Care Guide Note  03/30/2024 Name: Kimberly Rojas MRN: [id number] DOB: 1946/05/29   Complex Care Management Outreach Attempts: An unsuccessful telephone outreach was attempted today to offer the patient information about available complex care management services.  Follow Up Plan:  Additional outreach attempts will be made to offer the patient complex care management information and services.   Encounter Outcome:  No Answer  [facility]  [facility], [facility] Health Care Management Assistant Direct Dial: [phone]  Fax: [phone]

## 2024-03-30 NOTE — Telephone Encounter (Signed)
 Reason for Disposition . All other patients with painful urination  (Exception: [1] EITHER frequency or urgency AND [2] has on-call doctor.)  Answer Assessment - Initial Assessment Questions 1. SEVERITY: How bad is the pain?  (e.g., Scale 1-10; mild, moderate, or severe)     Mild pain, no pain for last urinatoin  2. FREQUENCY: How many times have you had painful urination today?      3 times  3. PATTERN: Is pain present every time you urinate or just sometimes?      Intermittent  4. ONSET: When did the painful urination start?      Today  5. FEVER: Do you have a fever? If Yes, ask: What is your temperature, how was it measured, and when did it start?     No 6. PAST UTI: Have you had a urine infection before? If Yes, ask: When was the last time? and What happened that time?      No 7. CAUSE: What do you think is causing the painful urination?  (e.g., UTI, scratch, Herpes sore)     UTI 8. OTHER SYMPTOMS: Do you have any other symptoms? (e.g., blood in urine, flank pain, genital sores, urgency, vaginal discharge)     Nasal congestion  Protocols used: Urination Pain - Female-A-AH

## 2024-03-31 ENCOUNTER — Other Ambulatory Visit

## 2024-03-31 NOTE — Addendum Note (Signed)
 Addended by: TANDA HARVEY D on: 03/31/2024 07:25 AM   Modules accepted: Orders

## 2024-03-31 NOTE — Telephone Encounter (Signed)
 Called pt and she stated that her cough has gotten better, but has not completely gone. She is going to pick up the medication today.

## 2024-04-01 ENCOUNTER — Ambulatory Visit: Admitting: Nurse Practitioner

## 2024-04-15 NOTE — Progress Notes (Unsigned)
 Complex Care Management Note Care Guide Note  04/15/2024 Name: MARKESHA HANNIG MRN: [id number] DOB: 1946-08-26   Complex Care Management Outreach Attempts: A second unsuccessful outreach was attempted today to offer the patient with information about available complex care management services.  Follow Up Plan:  Additional outreach attempts will be made to offer the patient complex care management information and services.   Encounter Outcome:  No Answer  [facility]  [facility], [facility] Health Care Management Assistant Direct Dial: [phone]  Fax: [phone]

## 2024-04-20 NOTE — Progress Notes (Signed)
 Complex Care Management Note Care Guide Note  04/20/2024 Name: Kimberly Rojas MRN: [id number] DOB: Oct 21, 1945   Complex Care Management Outreach Attempts: A third unsuccessful outreach was attempted today to offer the patient with information about available complex care management services.  Follow Up Plan:  No further outreach attempts will be made at this time. We have been unable to contact the patient to offer or enroll patient in complex care management services.  Encounter Outcome:  No Answer  [facility]  [facility], [facility]BCI Assistant Direct Dial: [phone]  Fax: [phone]

## 2024-04-29 DIAGNOSIS — G309 Alzheimer's disease, unspecified: Secondary | ICD-10-CM | POA: Diagnosis not present

## 2024-04-29 DIAGNOSIS — G939 Disorder of brain, unspecified: Secondary | ICD-10-CM | POA: Diagnosis not present

## 2024-08-09 ENCOUNTER — Telehealth: Payer: Self-pay

## 2024-08-09 NOTE — Telephone Encounter (Signed)
 Copied from CRM [id number]. Topic: General - Call Back - No Documentation >> Aug 09, 2024  1:52 PM Franky GRADE wrote: Reason for CRM: Patient is returning a call she received from the office but no documentation and patient did not receive a voicemail.

## 2024-08-10 NOTE — Telephone Encounter (Signed)
 Spoke with pt to verify that I do not see any documentation in the chart where someone called her yesterday.

## 2024-08-17 ENCOUNTER — Encounter: Payer: Self-pay | Admitting: Pharmacist

## 2024-08-17 NOTE — Progress Notes (Signed)
 Pharmacy Quality Measure Review  This patient is appearing on a report for being at risk of failing the adherence measure for cholesterol (statin) medications this calendar year.   Medication: rosuvastatin  10 mg Last fill date: 05/19/24 for 90 day supply  Insurance report was not up to date. No action needed at this time.  Medication has been refolled as of 08/07/24 x90ds. Picked up.  >80%.

## 2024-08-20 NOTE — Telephone Encounter (Unsigned)
 Copied from CRM #[id number]. Topic: General - Other >> Aug 20, 2024 10:20 AM Emylou G wrote: Reason for CRM: Leita Needy ( niece ) called.. she lives in a different state and has concerns.. Thinks her aunt has dementia, she is driving around, doesn't know or aware of her circumstances..  She wants to know if this is being addressed.. Really wants to speak to the doctor.. Says all the neighbors and friends are very worried.SABRA She would like a callback [phone].  She is okay with a 3 way call with patient if you need to do HIPAA before the call.

## 2024-08-20 NOTE — Telephone Encounter (Signed)
 Pt's niece is not on DPR so I called pt and received permission to speak with her niece, Leita. I asked the pt if she was ware that she had reached out to us  and if she knew what she had reached out to us  about. Pt stated that her niece is worried about her. I let the pt know that Leita stated that pt had been driving around and not aware of her circumstances. Pt wasn't sure why she said that. I also let pt know that Leita would like to know if her memory issues were being discussed during her appts and pt let me know that she is seeing a neurologist and has an appt scheduled for 09/30/2024 and that she is going to call their office to see if she could get it moved up. So I asked the pt if it was okay that I discussed this information with her niece and she stated that it was fine but she wasn't sure how they were concerned because they live in [place] . During the conversation with the pt she told me that she was having trouble with her husband but it was nothing bad. Once I received the okay to speak with her niece I called and spoke with Leita and she is very concerned about her Aunt. She is concerned that she is living alone, managing her medications and her finances on her own because she does have the memory issues, which led me to mention her husband. That is when Leita explained to me that the pt's husband died 10 years ago but the pt still has conversations about him like he is still living. She stated that pt asked when her husband is coming home and tells her that he is cheating on her, she sits up and waits on him at night. Leita also stated that she has talked on the phone with her when she is out driving around and she is just driving around in circles, she is concerned that she does not need to be driving anymore in fear that she is going to hurt herself or someone else by getting in a wreck. Leita is also concerned about the pt's son's involvement and would like for him to be more involved. She  would like to know if there is a way that pt's doctor could have a conversation with him to let him know of the severity of her dementia and that it is not getting any better. Leita feels that he needs to spend more time with his mother other than just on Sunday for dinner. I let Leita know that pt has been following with her Neurologist and has a follow up appt scheduled that pt is aware of but that I would pass this information on to Dr. Tullo and if there is anything she advises I will let the pt know.

## 2024-09-24 ENCOUNTER — Ambulatory Visit: Admitting: Internal Medicine

## 2024-09-24 DIAGNOSIS — E039 Hypothyroidism, unspecified: Secondary | ICD-10-CM

## 2024-09-24 DIAGNOSIS — Z1231 Encounter for screening mammogram for malignant neoplasm of breast: Secondary | ICD-10-CM

## 2024-09-24 DIAGNOSIS — E7800 Pure hypercholesterolemia, unspecified: Secondary | ICD-10-CM

## 2024-09-24 DIAGNOSIS — R030 Elevated blood-pressure reading, without diagnosis of hypertension: Secondary | ICD-10-CM

## 2024-10-21 ENCOUNTER — Ambulatory Visit: Admitting: Internal Medicine

## 2024-10-27 ENCOUNTER — Ambulatory Visit: Admitting: Internal Medicine
